# Patient Record
Sex: Female | Born: 1960
Health system: Southern US, Community
[De-identification: ages and names within clinical notes are randomized; demographics above are authoritative.]

## PROBLEM LIST (undated history)

## (undated) DIAGNOSIS — Z9221 Personal history of antineoplastic chemotherapy: Secondary | ICD-10-CM

## (undated) DIAGNOSIS — F419 Anxiety disorder, unspecified: Secondary | ICD-10-CM

## (undated) DIAGNOSIS — Z923 Personal history of irradiation: Secondary | ICD-10-CM

## (undated) DIAGNOSIS — T4145XA Adverse effect of unspecified anesthetic, initial encounter: Secondary | ICD-10-CM

## (undated) DIAGNOSIS — C50512 Malignant neoplasm of lower-outer quadrant of left female breast: Secondary | ICD-10-CM

## (undated) DIAGNOSIS — M2550 Pain in unspecified joint: Secondary | ICD-10-CM

## (undated) DIAGNOSIS — N854 Malposition of uterus: Secondary | ICD-10-CM

## (undated) DIAGNOSIS — K589 Irritable bowel syndrome without diarrhea: Secondary | ICD-10-CM

## (undated) DIAGNOSIS — K829 Disease of gallbladder, unspecified: Secondary | ICD-10-CM

## (undated) DIAGNOSIS — K219 Gastro-esophageal reflux disease without esophagitis: Secondary | ICD-10-CM

## (undated) DIAGNOSIS — F32A Depression, unspecified: Secondary | ICD-10-CM

## (undated) DIAGNOSIS — R12 Heartburn: Secondary | ICD-10-CM

## (undated) DIAGNOSIS — J45909 Unspecified asthma, uncomplicated: Secondary | ICD-10-CM

## (undated) DIAGNOSIS — T7840XA Allergy, unspecified, initial encounter: Secondary | ICD-10-CM

## (undated) DIAGNOSIS — T8859XA Other complications of anesthesia, initial encounter: Secondary | ICD-10-CM

## (undated) DIAGNOSIS — E063 Autoimmune thyroiditis: Secondary | ICD-10-CM

## (undated) DIAGNOSIS — F329 Major depressive disorder, single episode, unspecified: Secondary | ICD-10-CM

## (undated) DIAGNOSIS — E039 Hypothyroidism, unspecified: Secondary | ICD-10-CM

## (undated) HISTORY — PX: ENDOMETRIAL ABLATION: SHX621

## (undated) HISTORY — DX: Pain in unspecified joint: M25.50

## (undated) HISTORY — PX: CHOLECYSTECTOMY OPEN: SUR202

## (undated) HISTORY — DX: Malposition of uterus: N85.4

## (undated) HISTORY — DX: Anxiety disorder, unspecified: F41.9

## (undated) HISTORY — DX: Allergy, unspecified, initial encounter: T78.40XA

## (undated) HISTORY — DX: Malignant neoplasm of lower-outer quadrant of left female breast: C50.512

## (undated) HISTORY — DX: Heartburn: R12

## (undated) HISTORY — DX: Autoimmune thyroiditis: E06.3

## (undated) HISTORY — DX: Disease of gallbladder, unspecified: K82.9

## (undated) HISTORY — PX: BACK SURGERY: SHX140

## (undated) HISTORY — DX: Personal history of irradiation: Z92.3

## (undated) HISTORY — PX: OTHER SURGICAL HISTORY: SHX169

---

## 2013-05-04 LAB — HM MAMMOGRAPHY

## 2014-02-25 ENCOUNTER — Encounter: Payer: Self-pay | Admitting: Physician Assistant

## 2014-02-25 ENCOUNTER — Ambulatory Visit (INDEPENDENT_AMBULATORY_CARE_PROVIDER_SITE_OTHER): Payer: BLUE CROSS/BLUE SHIELD | Admitting: Physician Assistant

## 2014-02-25 VITALS — BP 177/66 | HR 64 | Ht 62.0 in | Wt 149.0 lb

## 2014-02-25 DIAGNOSIS — E7849 Other hyperlipidemia: Secondary | ICD-10-CM | POA: Insufficient documentation

## 2014-02-25 DIAGNOSIS — E063 Autoimmune thyroiditis: Secondary | ICD-10-CM

## 2014-02-25 DIAGNOSIS — Z23 Encounter for immunization: Secondary | ICD-10-CM

## 2014-02-25 DIAGNOSIS — J301 Allergic rhinitis due to pollen: Secondary | ICD-10-CM

## 2014-02-25 DIAGNOSIS — J309 Allergic rhinitis, unspecified: Secondary | ICD-10-CM | POA: Insufficient documentation

## 2014-02-25 DIAGNOSIS — E785 Hyperlipidemia, unspecified: Secondary | ICD-10-CM

## 2014-02-25 DIAGNOSIS — D259 Leiomyoma of uterus, unspecified: Secondary | ICD-10-CM | POA: Insufficient documentation

## 2014-02-25 DIAGNOSIS — K219 Gastro-esophageal reflux disease without esophagitis: Secondary | ICD-10-CM | POA: Diagnosis not present

## 2014-02-25 DIAGNOSIS — M2550 Pain in unspecified joint: Secondary | ICD-10-CM | POA: Insufficient documentation

## 2014-02-25 DIAGNOSIS — E782 Mixed hyperlipidemia: Secondary | ICD-10-CM | POA: Insufficient documentation

## 2014-02-25 MED ORDER — CELECOXIB 200 MG PO CAPS: 200.0000 mg | ORAL_CAPSULE | Freq: Two times a day (BID) | ORAL | Status: DC

## 2014-02-26 NOTE — Progress Notes (Signed)
Subjective:    Patient ID: Jenna [name], female    DOB: 01-29-1960, 54 y.o.   MRN: [id number]  HPI Patient is a 54 year old female who presents to the clinic to establish care.  .. Active Ambulatory Problems    Diagnosis Date Noted  . Hashimoto's thyroiditis 02/25/2014  . GERD (gastroesophageal reflux disease) 02/25/2014  . Allergic rhinitis 02/25/2014  . Uterine fibroid 02/25/2014  . Hyperlipidemia 02/25/2014  . Multiple joint pain 02/25/2014   Resolved Ambulatory Problems    Diagnosis Date Noted  . No Resolved Ambulatory Problems   Past Medical History  Diagnosis Date  . Hashimoto's disease    .Marland Kitchen Family History  Problem Relation Age of Onset  . Diabetes Mother   . Hypertension Mother   . Hyperlipidemia Mother   . Kidney disease Mother   . Cancer Maternal Grandfather    .Marland Kitchen History   Social History  . Marital Status: Married    Spouse Name: N/A  . Number of Children: N/A  . Years of Education: N/A   Occupational History  . Not on file.   Social History Main Topics  . Smoking status: Former Research scientist (life sciences)  . Smokeless tobacco: Not on file  . Alcohol Use: 0.6 oz/week    1 Standard drinks or equivalent per week  . Drug Use: No  . Sexual Activity:    Partners: Male   Other Topics Concern  . Not on file   Social History Narrative  . No narrative on file   Her only goal today is to establish care. She does not need medication refills.  Patient estimate her most concerning symptom is her joint pain all over. It is not in one joint. Sometimes will be in her knees sometimes in her shoulders and symptoms in her neck. She is currently on no treatment for this. She denies any injury or trauma.   Review of Systems  All other systems reviewed and are negative.      Objective:   Physical Exam  Constitutional: She is oriented to person, place, and time. She appears well-developed and well-nourished.  HENT:  Head: Normocephalic and atraumatic.  Cardiovascular:  Normal rate, regular rhythm and normal heart sounds.   Pulmonary/Chest: Effort normal and breath sounds normal.  Neurological: She is alert and oriented to person, place, and time.  Skin: Skin is dry.  Psychiatric: She has a normal mood and affect. Her behavior is normal.          Assessment & Plan:  Tetanus shot was given in office today with no complications.  Hashimoto's thyroiditis-patient has been referred to [facility] by her previous PCP. She has her first appointment at the beginning of March.  GERD-patient is on omeprazole she does not need a refill today. Her symptoms are well controlled.  Allergic rhinitis-patient is controlled with as needed antihistamine nasal spray.  Hyperlipidemia-per patient was checked in the summer of 2015. Will wait to get records to see what it was. Per patient slightly elevated but did not need medication. Discuss with patient she does need fasting labs including complete physical within the next year.  Multiple joint pain-being that Hashimoto's thyroiditis is an autoimmune disease. It could be that she does have some autoimmune process. Offered to do blood work today. Patient declined. She would like to follow-up with endocrinologist at [facility] for the [place] and didn't pursue it. She is not on any anti-inflammatory at this time. We did start Celebrex today to see if it added any benefit.  Discussed need for CPE to go over other health maintenance issues.

## 2014-03-14 ENCOUNTER — Encounter: Payer: Self-pay | Admitting: Physician Assistant

## 2014-03-14 ENCOUNTER — Other Ambulatory Visit: Payer: Self-pay | Admitting: Physician Assistant

## 2014-03-14 DIAGNOSIS — E063 Autoimmune thyroiditis: Secondary | ICD-10-CM

## 2014-03-14 DIAGNOSIS — M2550 Pain in unspecified joint: Secondary | ICD-10-CM

## 2014-04-22 ENCOUNTER — Other Ambulatory Visit: Payer: Self-pay | Admitting: Physician Assistant

## 2014-04-22 MED ORDER — CELECOXIB 200 MG PO CAPS: 200.0000 mg | ORAL_CAPSULE | Freq: Two times a day (BID) | ORAL | Status: DC

## 2014-04-22 NOTE — Telephone Encounter (Signed)
Needs appt for medication follow up.

## 2014-04-25 ENCOUNTER — Encounter: Payer: Self-pay | Admitting: Physician Assistant

## 2014-04-26 ENCOUNTER — Other Ambulatory Visit: Payer: Self-pay | Admitting: Physician Assistant

## 2014-04-29 ENCOUNTER — Other Ambulatory Visit: Payer: Self-pay | Admitting: Physician Assistant

## 2014-04-29 MED ORDER — LEVOTHYROXINE SODIUM 50 MCG PO TABS: 50.0000 ug | ORAL_TABLET | Freq: Every day | ORAL | Status: DC

## 2014-04-29 MED ORDER — OMEPRAZOLE 20 MG PO CPDR: 20.0000 mg | DELAYED_RELEASE_CAPSULE | Freq: Two times a day (BID) | ORAL | Status: DC

## 2014-05-02 LAB — T4, FREE: T4,FREE (DIRECT): 1.6

## 2014-05-02 LAB — T3, FREE: T3 FREE: 3.1

## 2014-05-02 LAB — TSH: TSH: 1.39 u[IU]/mL (ref 0.41–5.90)

## 2014-05-22 ENCOUNTER — Other Ambulatory Visit: Payer: Self-pay | Admitting: Physician Assistant

## 2014-05-24 ENCOUNTER — Other Ambulatory Visit: Payer: Self-pay | Admitting: *Deleted

## 2014-05-24 ENCOUNTER — Encounter: Payer: Self-pay | Admitting: Physician Assistant

## 2014-05-24 MED ORDER — CELECOXIB 200 MG PO CAPS: 200.0000 mg | ORAL_CAPSULE | Freq: Two times a day (BID) | ORAL | Status: DC

## 2014-05-24 NOTE — Telephone Encounter (Signed)
Error

## 2014-06-06 ENCOUNTER — Encounter: Payer: Self-pay | Admitting: Physician Assistant

## 2014-08-06 ENCOUNTER — Encounter: Payer: Self-pay | Admitting: Cardiology

## 2014-08-27 ENCOUNTER — Other Ambulatory Visit: Payer: Self-pay | Admitting: Physician Assistant

## 2014-10-22 ENCOUNTER — Ambulatory Visit: Payer: BLUE CROSS/BLUE SHIELD | Admitting: Physician Assistant

## 2014-10-28 ENCOUNTER — Ambulatory Visit (INDEPENDENT_AMBULATORY_CARE_PROVIDER_SITE_OTHER): Payer: BLUE CROSS/BLUE SHIELD | Admitting: Physician Assistant

## 2014-10-28 ENCOUNTER — Encounter: Payer: Self-pay | Admitting: Physician Assistant

## 2014-10-28 VITALS — BP 137/70 | HR 64 | Ht 62.0 in | Wt 149.0 lb

## 2014-10-28 DIAGNOSIS — E785 Hyperlipidemia, unspecified: Secondary | ICD-10-CM | POA: Diagnosis not present

## 2014-10-28 DIAGNOSIS — Z79899 Other long term (current) drug therapy: Secondary | ICD-10-CM

## 2014-10-28 DIAGNOSIS — E063 Autoimmune thyroiditis: Secondary | ICD-10-CM | POA: Diagnosis not present

## 2014-10-28 DIAGNOSIS — F418 Other specified anxiety disorders: Secondary | ICD-10-CM

## 2014-10-28 DIAGNOSIS — K219 Gastro-esophageal reflux disease without esophagitis: Secondary | ICD-10-CM

## 2014-10-28 DIAGNOSIS — Z23 Encounter for immunization: Secondary | ICD-10-CM | POA: Diagnosis not present

## 2014-10-28 DIAGNOSIS — Z1159 Encounter for screening for other viral diseases: Secondary | ICD-10-CM

## 2014-10-28 DIAGNOSIS — F329 Major depressive disorder, single episode, unspecified: Secondary | ICD-10-CM

## 2014-10-28 DIAGNOSIS — Z114 Encounter for screening for human immunodeficiency virus [HIV]: Secondary | ICD-10-CM

## 2014-10-28 DIAGNOSIS — F32A Depression, unspecified: Secondary | ICD-10-CM

## 2014-10-28 DIAGNOSIS — F419 Anxiety disorder, unspecified: Secondary | ICD-10-CM

## 2014-10-28 MED ORDER — ESCITALOPRAM OXALATE 10 MG PO TABS: 10.0000 mg | ORAL_TABLET | Freq: Every day | ORAL | Status: DC

## 2014-10-28 MED ORDER — CELECOXIB 200 MG PO CAPS: 200.0000 mg | ORAL_CAPSULE | Freq: Two times a day (BID) | ORAL | Status: DC

## 2014-10-28 MED ORDER — ALBUTEROL SULFATE HFA 108 (90 BASE) MCG/ACT IN AERS: 2.0000 | INHALATION_SPRAY | Freq: Four times a day (QID) | RESPIRATORY_TRACT | Status: DC | PRN

## 2014-10-28 NOTE — Progress Notes (Addendum)
Jenna [name] is a 54 y.o. female who presents to [place] today for medication refills. She states that she needs refills on her levothyroxine, celebrex, proair and proventil. She states that her allergist in [place] told her to use the proair daily and the proventil for rescue. She states that she has noticed an increase in her asthma symptoms since moving but she does not feel that they are an issue at this time.   She also states that she believes she may be experiencing some depression currently. She states that this has been going on for several years but has recently gotten worse. She said she has many episodes of crying for no reason, she has become more irritable especially towards her husband and children and she finds it hard to find pleasure in activities she used to enjoy. She states that she has previously tried counseling, meditation and exercise which have not helped very much. She states that when she saw counseling previously they believed her symptoms to be caused by her thyroid and she decided that she did not need counseling anymore.   PHQ-9 = 13 GAD-7 = 8   Past Medical History  Diagnosis Date  . Hashimoto's disease    Past Surgical History  Procedure Laterality Date  . Disk fusion    . Cholecystectomy open    . Endometrial ablation     Social History  Substance Use Topics  . Smoking status: Former Research scientist (life sciences)  . Smokeless tobacco: Not on file  . Alcohol Use: 0.6 oz/week    1 Standard drinks or equivalent per week   family history includes Cancer in her maternal grandfather; Diabetes in her mother; Hyperlipidemia in her mother; Hypertension in her mother; Kidney disease in her mother.  ROS as above Medications: Current Outpatient Prescriptions  Medication Sig Dispense Refill  . celecoxib (CELEBREX) 200 MG capsule Take 1 capsule (200 mg total) by mouth 2 (two) times daily. 60 capsule 5  . Cholecalciferol (VITAMIN D PO) Take by  mouth.    . Lactobacillus (DIGESTIVE HEALTH PROBIOTIC PO) Take by mouth.    . levothyroxine (SYNTHROID, LEVOTHROID) 50 MCG tablet Take 1 tablet (50 mcg total) by mouth daily. 90 tablet 1  . omeprazole (PRILOSEC) 20 MG capsule Take 1 capsule (20 mg total) by mouth 2 (two) times daily before a meal. 180 capsule 4  . Oxymetazoline HCl (NASAL SPRAY NA) Place into the nose.    . Polyethylene Glycol 3350 (MIRALAX PO) Take by mouth.    . Simethicone (GAS-X PO) Take by mouth.    Marland Kitchen albuterol (PROVENTIL HFA;VENTOLIN HFA) 108 (90 BASE) MCG/ACT inhaler Inhale 2 puffs into the lungs every 6 (six) hours as needed for wheezing or shortness of breath. 1 Inhaler 2  . escitalopram (LEXAPRO) 10 MG tablet Take 1 tablet (10 mg total) by mouth daily. 30 tablet 1   No current facility-administered medications for this visit.   No Known Allergies   Exam:  BP 137/70 mmHg  Pulse 64  Ht 5\' 2"  (1.575 m)  Wt 149 lb (67.586 kg)  BMI 27.25 kg/m2 Gen: WDWN female who is intermittently crying during the interview.  HEENT: EOMI,  MMM Lungs: Normal work of breathing. CTABL Heart: RRR no MRG Exts: Brisk capillary refill, warm and well perfused.   Assessment: 1. Hashimoto's thyroiditis based on previous diagnoses. 2. Asthma based on previous diagnoses and patient description of symptoms.  3. Moderate depression based on patient description of symptoms, PHQ-9 score of  13 and patient emotional status during interview. Mild anxiety based on GAD-7 score of 8. R/o other hormonal causes for mood swings, anxiety and depression. 4. Need for screening labs.  5. Multiple joint pain, well controlled with celebrex.   Plan: 1. Patient sent for labwork for TSH and if within normal limits will not need to f/u for 1 year. Patient levothyroxine refilled today.  2. Patient education provided on the concurrent use of both Proair and Proventil and the lack of benefit of using the same medications. I suggested she use just one inhaler  for rescue at this time, unless she begins to have increased symptoms. If she is using every day make another appt to discuss preventative. Patient conveyed understanding and agreed to the current treatment plan. 3. Patient sent for labwork today to r/o hormonal causes of symptoms. Patient started on escitalopram 10 mg tablet QD for depression and anxiety. Side discussed in office.  Patient instructed to f/u in 4-6 weeks for mood check. Patient instructed to return to the office or ED if she experiences suicidal or homicidal thoughts, chest pain or shortness of breath. Patient conveyed understanding and agreed to the current treatment plan.  4. Screening labs were ordered today for lipid and cmp. 5. celebrex refilled for symptom control.    Reviewed and changes made by Iran Planas PA-C

## 2014-10-29 ENCOUNTER — Encounter: Payer: Self-pay | Admitting: Physician Assistant

## 2014-10-29 DIAGNOSIS — Z780 Asymptomatic menopausal state: Secondary | ICD-10-CM | POA: Insufficient documentation

## 2014-10-29 LAB — LIPID PANEL
Cholesterol: 238 mg/dL — ABNORMAL HIGH (ref 125–200)
HDL: 71 mg/dL (ref >=46)
LDL CALC: 141 mg/dL — AB (ref <130)
Total CHOL/HDL Ratio: 3.4 Ratio (ref <=5.0)
Triglycerides: 131 mg/dL (ref <150)
VLDL: 26 mg/dL (ref <30)

## 2014-10-29 LAB — COMPLETE METABOLIC PANEL WITH GFR
ALT: 13 U/L (ref 6–29)
AST: 16 U/L (ref 10–35)
Albumin: 4.1 g/dL (ref 3.6–5.1)
Alkaline Phosphatase: 73 U/L (ref 33–130)
BILIRUBIN TOTAL: 0.6 mg/dL (ref 0.2–1.2)
BUN: 12 mg/dL (ref 7–25)
CALCIUM: 9.3 mg/dL (ref 8.6–10.4)
CO2: 27 mmol/L (ref 20–31)
CREATININE: 0.65 mg/dL (ref 0.50–1.05)
Chloride: 104 mmol/L (ref 98–110)
GFR, Est Non African American: >89 mL/min (ref >=60)
Glucose, Bld: 78 mg/dL (ref 65–99)
Potassium: 4.4 mmol/L (ref 3.5–5.3)
SODIUM: 142 mmol/L (ref 135–146)
TOTAL PROTEIN: 6.9 g/dL (ref 6.1–8.1)

## 2014-10-29 LAB — HIV ANTIBODY (ROUTINE TESTING W REFLEX): HIV 1&2 Ab, 4th Generation: NONREACTIVE

## 2014-10-29 LAB — FOLLICLE STIMULATING HORMONE: FSH: 140.5 m[IU]/mL — AB

## 2014-10-29 LAB — LUTEINIZING HORMONE: LH: 58 m[IU]/mL

## 2014-10-29 LAB — TSH: TSH: 1.885 u[IU]/mL (ref 0.350–4.500)

## 2014-10-29 LAB — HEPATITIS C ANTIBODY: HCV Ab: NEGATIVE

## 2014-11-18 ENCOUNTER — Telehealth: Payer: Self-pay | Admitting: *Deleted

## 2014-11-18 NOTE — Telephone Encounter (Signed)
Pt's husband left vm this morning stating that since you started pt on Lexapro her food intake is about 1/3 of what it was before.  I called the pt herself this afternoon to discuss this.  Apparently she did not know that her husband had called me & stated that she thinks she was just emotionally overeating before & now it's just evening back out.  I advised her that she needed a f/u with you and she agreed to make one.  This is just an [place].

## 2014-11-18 NOTE — Telephone Encounter (Signed)
Follow up and recheck weight.

## 2014-12-11 ENCOUNTER — Encounter: Payer: Self-pay | Admitting: Physician Assistant

## 2014-12-11 ENCOUNTER — Ambulatory Visit (INDEPENDENT_AMBULATORY_CARE_PROVIDER_SITE_OTHER): Payer: BLUE CROSS/BLUE SHIELD | Admitting: Physician Assistant

## 2014-12-11 VITALS — BP 114/66 | HR 72 | Ht 62.0 in | Wt 149.0 lb

## 2014-12-11 DIAGNOSIS — F418 Other specified anxiety disorders: Secondary | ICD-10-CM | POA: Diagnosis not present

## 2014-12-11 DIAGNOSIS — R454 Irritability and anger: Secondary | ICD-10-CM

## 2014-12-11 DIAGNOSIS — G4700 Insomnia, unspecified: Secondary | ICD-10-CM | POA: Diagnosis not present

## 2014-12-11 DIAGNOSIS — F419 Anxiety disorder, unspecified: Principal | ICD-10-CM | POA: Insufficient documentation

## 2014-12-11 DIAGNOSIS — F329 Major depressive disorder, single episode, unspecified: Secondary | ICD-10-CM

## 2014-12-11 MED ORDER — TRAZODONE HCL 50 MG PO TABS: 50.0000 mg | ORAL_TABLET | Freq: Every day | ORAL | Status: DC

## 2014-12-11 MED ORDER — ESCITALOPRAM OXALATE 20 MG PO TABS: 20.0000 mg | ORAL_TABLET | Freq: Every day | ORAL | Status: DC

## 2014-12-11 MED ORDER — LEVOTHYROXINE SODIUM 50 MCG PO TABS: 50.0000 ug | ORAL_TABLET | Freq: Every day | ORAL | Status: DC

## 2014-12-11 NOTE — Progress Notes (Signed)
   Subjective:    Patient ID: Jenna [name], female    DOB: 01-27-1960, 54 y.o.   MRN: [id number]  HPI Pt is a 54 yo female who presents to the clinic to follow up on anxiety, depression, anger and irritability. She is on lexapro 10mg  and doing much better. She is crying much less and not having outburst of anger like she was. She is still not sleeping. She feels like if she could sleep would make her feel even better. She does have less of appetite but still eating. She feels like she has cut out stress eating. Denies any suicidal or homicidal thoughts.    Review of Systems  All other systems reviewed and are negative.      Objective:   Physical Exam  Constitutional: She is oriented to person, place, and time. She appears well-developed and well-nourished.  HENT:  Head: Normocephalic and atraumatic.  Cardiovascular: Normal rate, regular rhythm and normal heart sounds.   Pulmonary/Chest: Effort normal and breath sounds normal.  Neurological: She is alert and oriented to person, place, and time.  Psychiatric: She has a normal mood and affect. Her behavior is normal.          Assessment & Plan:  Anxiety and Depression/irritability and anger/insomnia- PHQ-9 was 13 but 6 points related to sleep. GAD-7 was 4 which improved. Overall scores have improved. Increase lexapro to 20mg  daily. Added trazodone 25-50mg  at bedtime. Follow up in 3-6 months. Weight is the same I am not concerned with decrease in appetite in eating as long as she is getting 1500 calories a day.   Hypothyroidism- last TSH in octobet looked great. Refilled for 6 months.

## 2014-12-20 ENCOUNTER — Encounter: Payer: Self-pay | Admitting: Physician Assistant

## 2014-12-20 ENCOUNTER — Other Ambulatory Visit: Payer: Self-pay

## 2014-12-20 MED ORDER — ALBUTEROL SULFATE 108 (90 BASE) MCG/ACT IN AEPB: 2.0000 | INHALATION_SPRAY | Freq: Four times a day (QID) | RESPIRATORY_TRACT | Status: DC

## 2014-12-20 MED ORDER — ESCITALOPRAM OXALATE 20 MG PO TABS: 20.0000 mg | ORAL_TABLET | Freq: Every day | ORAL | Status: DC

## 2014-12-21 ENCOUNTER — Other Ambulatory Visit: Payer: Self-pay | Admitting: Physician Assistant

## 2014-12-26 ENCOUNTER — Telehealth: Payer: Self-pay | Admitting: Physician Assistant

## 2014-12-26 NOTE — Telephone Encounter (Signed)
Received prior authorization on Proair Respiclick Inhaler sent through cover my meds waiting on authorization. - CF

## 2015-01-05 DIAGNOSIS — C50919 Malignant neoplasm of unspecified site of unspecified female breast: Secondary | ICD-10-CM

## 2015-01-05 HISTORY — DX: Malignant neoplasm of unspecified site of unspecified female breast: C50.919

## 2015-01-29 ENCOUNTER — Encounter: Payer: Self-pay | Admitting: Physician Assistant

## 2015-02-05 ENCOUNTER — Other Ambulatory Visit: Payer: Self-pay | Admitting: *Deleted

## 2015-02-05 MED ORDER — TRAZODONE HCL 50 MG PO TABS: 50.0000 mg | ORAL_TABLET | Freq: Every day | ORAL | Status: DC

## 2015-02-12 NOTE — Telephone Encounter (Signed)
Prior authorization is not required for this medication. - CF

## 2015-02-22 ENCOUNTER — Other Ambulatory Visit: Payer: Self-pay | Admitting: Physician Assistant

## 2015-02-27 ENCOUNTER — Other Ambulatory Visit: Payer: Self-pay | Admitting: *Deleted

## 2015-02-27 MED ORDER — CELECOXIB 200 MG PO CAPS: 200.0000 mg | ORAL_CAPSULE | Freq: Two times a day (BID) | ORAL | Status: DC

## 2015-03-25 ENCOUNTER — Other Ambulatory Visit: Payer: Self-pay | Admitting: Physician Assistant

## 2015-04-05 ENCOUNTER — Other Ambulatory Visit: Payer: Self-pay | Admitting: Physician Assistant

## 2015-06-28 ENCOUNTER — Other Ambulatory Visit: Payer: Self-pay | Admitting: Physician Assistant

## 2015-06-30 ENCOUNTER — Other Ambulatory Visit: Payer: Self-pay | Admitting: Emergency Medicine

## 2015-06-30 DIAGNOSIS — E063 Autoimmune thyroiditis: Secondary | ICD-10-CM

## 2015-06-30 MED ORDER — LEVOTHYROXINE SODIUM 50 MCG PO TABS
ORAL_TABLET | ORAL | Status: DC
Start: 2015-06-30 — End: 2015-12-22

## 2015-07-03 ENCOUNTER — Other Ambulatory Visit: Payer: Self-pay | Admitting: Physician Assistant

## 2015-07-29 ENCOUNTER — Encounter: Payer: Self-pay | Admitting: Physician Assistant

## 2015-07-29 DIAGNOSIS — E039 Hypothyroidism, unspecified: Secondary | ICD-10-CM

## 2015-07-30 ENCOUNTER — Other Ambulatory Visit: Payer: Self-pay | Admitting: Physician Assistant

## 2015-07-30 DIAGNOSIS — E039 Hypothyroidism, unspecified: Secondary | ICD-10-CM

## 2015-08-01 ENCOUNTER — Other Ambulatory Visit: Payer: Self-pay | Admitting: Physician Assistant

## 2015-08-01 MED ORDER — LIOTHYRONINE SODIUM 25 MCG PO TABS: 25.0000 ug | ORAL_TABLET | Freq: Every day | ORAL | 0 refills | Status: DC

## 2015-08-12 ENCOUNTER — Encounter: Payer: Self-pay | Admitting: Physician Assistant

## 2015-08-12 ENCOUNTER — Ambulatory Visit (INDEPENDENT_AMBULATORY_CARE_PROVIDER_SITE_OTHER): Payer: BLUE CROSS/BLUE SHIELD | Admitting: Physician Assistant

## 2015-08-12 VITALS — BP 126/73 | HR 71 | Ht 62.0 in | Wt 161.0 lb

## 2015-08-12 DIAGNOSIS — Z1231 Encounter for screening mammogram for malignant neoplasm of breast: Secondary | ICD-10-CM

## 2015-08-12 DIAGNOSIS — Z13220 Encounter for screening for lipoid disorders: Secondary | ICD-10-CM | POA: Diagnosis not present

## 2015-08-12 DIAGNOSIS — R140 Abdominal distension (gaseous): Secondary | ICD-10-CM | POA: Diagnosis not present

## 2015-08-12 DIAGNOSIS — F418 Other specified anxiety disorders: Secondary | ICD-10-CM

## 2015-08-12 DIAGNOSIS — Z840 Family history of diseases of the skin and subcutaneous tissue: Secondary | ICD-10-CM

## 2015-08-12 DIAGNOSIS — G4700 Insomnia, unspecified: Secondary | ICD-10-CM

## 2015-08-12 DIAGNOSIS — Z0000 Encounter for general adult medical examination without abnormal findings: Secondary | ICD-10-CM

## 2015-08-12 DIAGNOSIS — E063 Autoimmune thyroiditis: Secondary | ICD-10-CM

## 2015-08-12 DIAGNOSIS — E039 Hypothyroidism, unspecified: Secondary | ICD-10-CM | POA: Diagnosis not present

## 2015-08-12 DIAGNOSIS — Z131 Encounter for screening for diabetes mellitus: Secondary | ICD-10-CM

## 2015-08-12 DIAGNOSIS — Z8261 Family history of arthritis: Secondary | ICD-10-CM

## 2015-08-12 DIAGNOSIS — M2550 Pain in unspecified joint: Secondary | ICD-10-CM

## 2015-08-12 DIAGNOSIS — Z8269 Family history of other diseases of the musculoskeletal system and connective tissue: Secondary | ICD-10-CM

## 2015-08-12 DIAGNOSIS — Z139 Encounter for screening, unspecified: Secondary | ICD-10-CM

## 2015-08-12 DIAGNOSIS — F419 Anxiety disorder, unspecified: Secondary | ICD-10-CM

## 2015-08-12 DIAGNOSIS — F329 Major depressive disorder, single episode, unspecified: Secondary | ICD-10-CM

## 2015-08-12 DIAGNOSIS — F32A Depression, unspecified: Secondary | ICD-10-CM

## 2015-08-12 DIAGNOSIS — K5900 Constipation, unspecified: Secondary | ICD-10-CM

## 2015-08-12 LAB — COMPLETE METABOLIC PANEL WITH GFR
ALT: 19 U/L (ref 6–29)
AST: 20 U/L (ref 10–35)
Albumin: 4.1 g/dL (ref 3.6–5.1)
Alkaline Phosphatase: 60 U/L (ref 33–130)
BILIRUBIN TOTAL: 0.6 mg/dL (ref 0.2–1.2)
BUN: 13 mg/dL (ref 7–25)
CALCIUM: 9.7 mg/dL (ref 8.6–10.4)
CHLORIDE: 102 mmol/L (ref 98–110)
CO2: 24 mmol/L (ref 20–31)
CREATININE: 0.68 mg/dL (ref 0.50–1.05)
GFR, Est Non African American: >89 mL/min (ref >=60)
Glucose, Bld: 84 mg/dL (ref 65–99)
Potassium: 4.5 mmol/L (ref 3.5–5.3)
Sodium: 142 mmol/L (ref 135–146)
Total Protein: 6.9 g/dL (ref 6.1–8.1)

## 2015-08-12 LAB — LIPID PANEL
CHOLESTEROL: 219 mg/dL — AB (ref 125–200)
HDL: 73 mg/dL (ref >=46)
LDL Cholesterol: 111 mg/dL (ref <130)
TRIGLYCERIDES: 173 mg/dL — AB (ref <150)
Total CHOL/HDL Ratio: 3 Ratio (ref <=5.0)
VLDL: 35 mg/dL — ABNORMAL HIGH (ref <30)

## 2015-08-12 LAB — RHEUMATOID FACTOR

## 2015-08-12 MED ORDER — DULOXETINE HCL 30 MG PO CPEP: 30.0000 mg | ORAL_CAPSULE | Freq: Every day | ORAL | 1 refills | Status: DC

## 2015-08-12 MED ORDER — LINACLOTIDE 290 MCG PO CAPS: 290.0000 ug | ORAL_CAPSULE | Freq: Every day | ORAL | 1 refills | Status: DC

## 2015-08-12 NOTE — Patient Instructions (Signed)
Keeping You Healthy  Get These Tests  Blood Pressure- Have your blood pressure checked by your healthcare provider at least once a year.  Normal blood pressure is 120/80.  Weight- Have your body mass index (BMI) calculated to screen for obesity.  BMI is a measure of body fat based on height and weight.  You can calculate your own BMI at www.nhlbisupport.com/bmi/  Cholesterol- Have your cholesterol checked every year.  Diabetes- Have your blood sugar checked every year if you have high blood pressure, high cholesterol, a family history of diabetes or if you are overweight.  Pap Test - Have a pap test every 1 to 5 years if you have been sexually active.  If you are older than 53 and recent pap tests have been normal you may not need additional pap tests.  In addition, if you have had a hysterectomy  for benign disease additional pap tests are not necessary.  Mammogram-Yearly mammograms are essential for early detection of breast cancer  Screening for Colon Cancer- Colonoscopy starting at age 53. Screening may begin sooner depending on your family history and other health conditions.  Follow up colonoscopy as directed by your Gastroenterologist.  Screening for Osteoporosis- Screening begins at age 65 with bone density scanning, sooner if you are at higher risk for developing Osteoporosis.  Get these medicines  Calcium with Vitamin D- Your body requires 1200-1500 mg of Calcium a day and 800-1000 IU of Vitamin D a day.  You can only absorb 500 mg of Calcium at a time therefore Calcium must be taken in 2 or 3 separate doses throughout the day.  Hormones- Hormone therapy has been associated with increased risk for certain cancers and heart disease.  Talk to your healthcare provider about if you need relief from menopausal symptoms.  Aspirin- Ask your healthcare provider about taking Aspirin to prevent Heart Disease and Stroke.  Get these Immuniztions  Flu shot- Every fall  Pneumonia shot-  Once after the age of 53; if you are younger ask your healthcare provider if you need a pneumonia shot.  Tetanus- Every ten years.  Zostavax- Once after the age of 53 to prevent shingles.  Take these steps  Don't smoke- Your healthcare provider can help you quit. For tips on how to quit, ask your healthcare provider or go to www.smokefree.gov or call 1-800 QUIT-NOW.  Be physically active- Exercise 5 days a week for a minimum of 30 minutes.  If you are not already physically active, start slow and gradually work up to 30 minutes of moderate physical activity.  Try walking, dancing, bike riding, swimming, etc.  Eat a healthy diet- Eat a variety of healthy foods such as fruits, vegetables, whole grains, low fat milk, low fat cheeses, yogurt, lean meats, chicken, fish, eggs, dried beans, tofu, etc.  For more information go to www.thenutritionsource.org  Dental visit- Brush and floss teeth twice daily; visit your dentist twice a year.  Eye exam- Visit your Optometrist or Ophthalmologist yearly.  Drink alcohol in moderation- Limit alcohol intake to one drink or less a day.  Never drink and drive.  Depression- Your emotional health is as important as your physical health.  If you're feeling down or losing interest in things you normally enjoy, please talk to your healthcare provider.  Seat Belts- can save your life; always wear one  Smoke/Carbon Monoxide detectors- These detectors need to be installed on the appropriate level of your home.  Replace batteries at least once a year.  Violence- If   anyone is threatening or hurting you, please tell your healthcare provider.  Living Will/ Health care power of attorney- Discuss with your healthcare provider and family. 

## 2015-08-13 ENCOUNTER — Encounter: Payer: Self-pay | Admitting: Physician Assistant

## 2015-08-13 DIAGNOSIS — E781 Pure hyperglyceridemia: Secondary | ICD-10-CM | POA: Insufficient documentation

## 2015-08-13 LAB — T3 UPTAKE: T3 Uptake: 31 % (ref 22–35)

## 2015-08-13 LAB — THYROID ANTIBODIES
THYROGLOBULIN AB: 14 [IU]/mL — AB (ref <2)
THYROID PEROXIDASE ANTIBODY: 162 [IU]/mL — AB (ref <9)

## 2015-08-13 LAB — TSH: TSH: 0.1 m[IU]/L — AB

## 2015-08-13 LAB — CYCLIC CITRUL PEPTIDE ANTIBODY, IGG: Cyclic Citrullin Peptide Ab: <16 Units

## 2015-08-13 LAB — T4, FREE: FREE T4: 1.2 ng/dL (ref 0.8–1.8)

## 2015-08-14 LAB — T3, REVERSE: T3, Reverse: 13 ng/dL (ref 8–25)

## 2015-08-15 ENCOUNTER — Encounter: Payer: Self-pay | Admitting: Physician Assistant

## 2015-08-15 DIAGNOSIS — K5900 Constipation, unspecified: Secondary | ICD-10-CM | POA: Insufficient documentation

## 2015-08-15 DIAGNOSIS — Z8269 Family history of other diseases of the musculoskeletal system and connective tissue: Secondary | ICD-10-CM | POA: Insufficient documentation

## 2015-08-15 DIAGNOSIS — R140 Abdominal distension (gaseous): Secondary | ICD-10-CM | POA: Insufficient documentation

## 2015-08-15 LAB — IGG FOOD PANEL
ALLERGEN BEEF IGG: 9.5 ug/mL — AB (ref <2.0)
ALLERGEN WHEAT IGG: 1 ug/mL — AB (ref <0.15)
Allergen, Corn, IgG4: <0.15 ug/mL (ref <0.15)
Allergen, Milk, IgG: 17.4 ug/mL — ABNORMAL HIGH (ref <0.15)
Peanut, IgG: <0.15 ug/mL (ref <0.15)

## 2015-08-15 NOTE — Progress Notes (Signed)
Subjective:     Jenna [name] is a 55 y.o. female and is here for a comprehensive physical exam. The patient reports problems - pt feels like her symptoms that she was having before being diagnosed with thyroiid disease are coming back. she is tired, dry skin, blloating, consttipation, insomnia, weight gain. we recently added cytomel but not noticed any improvement. .  Social History   Social History  . Marital status: Married    Spouse name: N/A  . Number of children: N/A  . Years of education: N/A   Occupational History  . Not on file.   Social History Main Topics  . Smoking status: Former Research scientist (life sciences)  . Smokeless tobacco: Not on file  . Alcohol use 0.6 oz/week    1 Standard drinks or equivalent per week  . Drug use: No  . Sexual activity: Yes    Partners: Male   Other Topics Concern  . Not on file   Social History Narrative  . No narrative on file   Health Maintenance  Topic Date Due  . MAMMOGRAM  05/05/2015  . INFLUENZA VACCINE  08/05/2015  . PAP SMEAR  05/18/2016  . COLONOSCOPY  06/26/2018  . TETANUS/TDAP  02/26/2024  . Hepatitis C Screening  Completed  . HIV Screening  Completed    The following portions of the patient's history were reviewed and updated as appropriate: allergies, current medications, past family history, past medical history, past social history, past surgical history and problem list.  Review of Systems Pertinent items noted in HPI and remainder of comprehensive ROS otherwise negative.   Objective:    BP 126/73   Pulse 71   Ht 5\' 2"  (1.575 m)   Wt 161 lb (73 kg)   BMI 29.45 kg/m  General appearance: alert, cooperative and appears stated age Head: Normocephalic, without obvious abnormality, atraumatic Eyes: conjunctivae/corneas clear. PERRL, EOM's intact. Fundi benign. Ears: normal TM's and external ear canals both ears Nose: Nares normal. Septum midline. Mucosa normal. No drainage or sinus tenderness. Throat: lips, mucosa, and tongue  normal; teeth and gums normal Neck: no adenopathy, no carotid bruit, no JVD, supple, symmetrical, trachea midline and thyroid not enlarged, symmetric, no tenderness/mass/nodules Back: symmetric, no curvature. ROM normal. No CVA tenderness. Lungs: clear to auscultation bilaterally Heart: regular rate and rhythm, S1, S2 normal, no murmur, click, rub or gallop Abdomen: soft, non-tender; bowel sounds normal; no masses,  no organomegaly Extremities: extremities normal, atraumatic, no cyanosis or edema Pulses: 2+ and symmetric Skin: Skin color, texture, turgor normal. No rashes or lesions Lymph nodes: Cervical, supraclavicular, and axillary nodes normal. Neurologic: Alert and oriented X 3, normal strength and tone. Normal symmetric reflexes. Normal coordination and gait    Assessment:    Healthy female exam.      Plan:  CPE- vaccines up to date. Colonoscopy up to date. Needs mammogram. Lipid, cmp. Discussed vitamin d 800 units and calcium 1500mg   Encouraged exercise.   Anxiety and depression/insomnia- PHQ-9 was 22. GAD-7 was 18.   Hashimoto's thyroiditis- recheck levels and adjust accordingly.   Multiple joint pain- likely ANa would be positive with hashimotos'. fam hx of RA/lupus. Rheumatoid ordered. Consider referral to rheumatology.      See After Visit Summary for Counseling Recommendations

## 2015-08-19 ENCOUNTER — Encounter: Payer: Self-pay | Admitting: Physician Assistant

## 2015-08-19 DIAGNOSIS — Z91018 Allergy to other foods: Secondary | ICD-10-CM | POA: Insufficient documentation

## 2015-08-22 ENCOUNTER — Ambulatory Visit (INDEPENDENT_AMBULATORY_CARE_PROVIDER_SITE_OTHER): Payer: BLUE CROSS/BLUE SHIELD

## 2015-08-22 DIAGNOSIS — Z0000 Encounter for general adult medical examination without abnormal findings: Secondary | ICD-10-CM

## 2015-08-22 DIAGNOSIS — Z1231 Encounter for screening mammogram for malignant neoplasm of breast: Secondary | ICD-10-CM

## 2015-08-22 DIAGNOSIS — R928 Other abnormal and inconclusive findings on diagnostic imaging of breast: Secondary | ICD-10-CM

## 2015-08-28 ENCOUNTER — Other Ambulatory Visit: Payer: Self-pay | Admitting: Physician Assistant

## 2015-09-02 ENCOUNTER — Encounter: Payer: Self-pay | Admitting: Physician Assistant

## 2015-09-02 DIAGNOSIS — N6320 Unspecified lump in the left breast, unspecified quadrant: Secondary | ICD-10-CM | POA: Insufficient documentation

## 2015-09-03 ENCOUNTER — Other Ambulatory Visit: Payer: Self-pay | Admitting: Physician Assistant

## 2015-09-03 DIAGNOSIS — R928 Other abnormal and inconclusive findings on diagnostic imaging of breast: Secondary | ICD-10-CM

## 2015-09-09 ENCOUNTER — Other Ambulatory Visit: Payer: Self-pay | Admitting: Physician Assistant

## 2015-09-09 ENCOUNTER — Ambulatory Visit
Admission: RE | Admit: 2015-09-09 | Discharge: 2015-09-09 | Disposition: A | Discharge status: Home / Self Care | Payer: BLUE CROSS/BLUE SHIELD | Source: Ambulatory Visit | Attending: Physician Assistant | Admitting: Physician Assistant

## 2015-09-09 DIAGNOSIS — N63 Unspecified lump in breast: Secondary | ICD-10-CM | POA: Diagnosis not present

## 2015-09-09 DIAGNOSIS — R928 Other abnormal and inconclusive findings on diagnostic imaging of breast: Secondary | ICD-10-CM

## 2015-09-09 DIAGNOSIS — R229 Localized swelling, mass and lump, unspecified: Principal | ICD-10-CM

## 2015-09-09 DIAGNOSIS — IMO0002 Reserved for concepts with insufficient information to code with codable children: Secondary | ICD-10-CM

## 2015-09-10 ENCOUNTER — Ambulatory Visit
Admission: RE | Admit: 2015-09-10 | Discharge: 2015-09-10 | Disposition: A | Discharge status: Home / Self Care | Payer: BLUE CROSS/BLUE SHIELD | Source: Ambulatory Visit | Attending: Physician Assistant | Admitting: Physician Assistant

## 2015-09-10 ENCOUNTER — Other Ambulatory Visit: Payer: Self-pay | Admitting: Physician Assistant

## 2015-09-10 ENCOUNTER — Encounter: Payer: Self-pay | Admitting: Physician Assistant

## 2015-09-10 DIAGNOSIS — R229 Localized swelling, mass and lump, unspecified: Principal | ICD-10-CM

## 2015-09-10 DIAGNOSIS — N63 Unspecified lump in breast: Secondary | ICD-10-CM | POA: Diagnosis not present

## 2015-09-10 DIAGNOSIS — IMO0002 Reserved for concepts with insufficient information to code with codable children: Secondary | ICD-10-CM

## 2015-09-10 DIAGNOSIS — C50512 Malignant neoplasm of lower-outer quadrant of left female breast: Secondary | ICD-10-CM | POA: Diagnosis not present

## 2015-09-10 HISTORY — PX: BREAST BIOPSY: SHX20

## 2015-09-12 ENCOUNTER — Telehealth: Payer: Self-pay | Admitting: *Deleted

## 2015-09-12 ENCOUNTER — Encounter: Payer: Self-pay | Admitting: *Deleted

## 2015-09-12 DIAGNOSIS — C50512 Malignant neoplasm of lower-outer quadrant of left female breast: Secondary | ICD-10-CM

## 2015-09-12 HISTORY — DX: Malignant neoplasm of lower-outer quadrant of left female breast: C50.512

## 2015-09-12 NOTE — Telephone Encounter (Signed)
Confirmed [facility] for 09/17/15 at 0815 .  Instructions and contact information given.  

## 2015-09-16 ENCOUNTER — Encounter: Payer: Self-pay | Admitting: Genetic Counselor

## 2015-09-16 ENCOUNTER — Other Ambulatory Visit (HOSPITAL_COMMUNITY): Payer: Self-pay | Admitting: Diagnostic Radiology

## 2015-09-17 ENCOUNTER — Ambulatory Visit: Payer: Self-pay | Admitting: Surgery

## 2015-09-17 ENCOUNTER — Ambulatory Visit (HOSPITAL_BASED_OUTPATIENT_CLINIC_OR_DEPARTMENT_OTHER): Payer: BLUE CROSS/BLUE SHIELD | Admitting: Hematology and Oncology

## 2015-09-17 ENCOUNTER — Encounter: Payer: Self-pay | Admitting: Physical Therapy

## 2015-09-17 ENCOUNTER — Other Ambulatory Visit: Payer: Self-pay | Admitting: *Deleted

## 2015-09-17 ENCOUNTER — Ambulatory Visit
Admission: RE | Admit: 2015-09-17 | Discharge: 2015-09-17 | Disposition: A | Discharge status: Home / Self Care | Payer: BLUE CROSS/BLUE SHIELD | Source: Ambulatory Visit | Attending: Radiation Oncology | Admitting: Radiation Oncology

## 2015-09-17 ENCOUNTER — Other Ambulatory Visit: Payer: Self-pay | Admitting: Physician Assistant

## 2015-09-17 ENCOUNTER — Ambulatory Visit: Payer: BLUE CROSS/BLUE SHIELD | Attending: Surgery | Admitting: Physical Therapy

## 2015-09-17 ENCOUNTER — Other Ambulatory Visit (HOSPITAL_BASED_OUTPATIENT_CLINIC_OR_DEPARTMENT_OTHER): Payer: BLUE CROSS/BLUE SHIELD

## 2015-09-17 ENCOUNTER — Encounter: Payer: Self-pay | Admitting: Hematology and Oncology

## 2015-09-17 ENCOUNTER — Encounter: Payer: Self-pay | Admitting: *Deleted

## 2015-09-17 DIAGNOSIS — C50512 Malignant neoplasm of lower-outer quadrant of left female breast: Secondary | ICD-10-CM

## 2015-09-17 DIAGNOSIS — E063 Autoimmune thyroiditis: Secondary | ICD-10-CM

## 2015-09-17 DIAGNOSIS — C50112 Malignant neoplasm of central portion of left female breast: Secondary | ICD-10-CM

## 2015-09-17 DIAGNOSIS — R293 Abnormal posture: Secondary | ICD-10-CM

## 2015-09-17 DIAGNOSIS — C50912 Malignant neoplasm of unspecified site of left female breast: Secondary | ICD-10-CM | POA: Diagnosis not present

## 2015-09-17 DIAGNOSIS — Z170 Estrogen receptor positive status [ER+]: Secondary | ICD-10-CM | POA: Diagnosis not present

## 2015-09-17 LAB — COMPREHENSIVE METABOLIC PANEL
ALK PHOS: 58 U/L (ref 40–150)
ALT: 17 U/L (ref 0–55)
ANION GAP: 10 meq/L (ref 3–11)
AST: 17 U/L (ref 5–34)
Albumin: 3.1 g/dL — ABNORMAL LOW (ref 3.5–5.0)
BUN: 16 mg/dL (ref 7.0–26.0)
CALCIUM: 9.3 mg/dL (ref 8.4–10.4)
CHLORIDE: 104 meq/L (ref 98–109)
CO2: 28 mEq/L (ref 22–29)
Creatinine: 0.7 mg/dL (ref 0.6–1.1)
Glucose: 90 mg/dl (ref 70–140)
POTASSIUM: 4.2 meq/L (ref 3.5–5.1)
Sodium: 142 mEq/L (ref 136–145)
Total Bilirubin: 0.54 mg/dL (ref 0.20–1.20)
Total Protein: 6.7 g/dL (ref 6.4–8.3)

## 2015-09-17 LAB — CBC WITH DIFFERENTIAL/PLATELET
BASO%: 0.2 % (ref 0.0–2.0)
BASOS ABS: 0 10*3/uL (ref 0.0–0.1)
EOS ABS: 0.1 10*3/uL (ref 0.0–0.5)
EOS%: 2.2 % (ref 0.0–7.0)
HEMATOCRIT: 38.9 % (ref 34.8–46.6)
HGB: 12.7 g/dL (ref 11.6–15.9)
LYMPH#: 2.1 10*3/uL (ref 0.9–3.3)
LYMPH%: 34.2 % (ref 14.0–49.7)
MCH: 30.1 pg (ref 25.1–34.0)
MCHC: 32.6 g/dL (ref 31.5–36.0)
MCV: 92.2 fL (ref 79.5–101.0)
MONO#: 0.6 10*3/uL (ref 0.1–0.9)
MONO%: 9.7 % (ref 0.0–14.0)
NEUT#: 3.2 10*3/uL (ref 1.5–6.5)
NEUT%: 53.7 % (ref 38.4–76.8)
PLATELETS: 230 10*3/uL (ref 145–400)
RBC: 4.22 10*6/uL (ref 3.70–5.45)
RDW: 12.5 % (ref 11.2–14.5)
WBC: 6 10*3/uL (ref 3.9–10.3)

## 2015-09-17 NOTE — Patient Instructions (Signed)

## 2015-09-17 NOTE — H&P (Signed)
Rennie Natter 09/17/2015 7:29 AM Location: [facility] Patient #: [id number] DOB: 04-22-1960 Undefined / Language: Cleophus Molt / Race: White Female  History of Present Illness Marcello Moores A. Lisett Dirusso MD; 09/17/2015 10:11 AM) Patient words: Breast cancer left  Pt sent at the request of Dr Sondra Come for left breast cancer diagnosed by mammogram and U/S. Pt denies hx of breast mass, pain or discharge. No hx of breast cancer in her family. Mammogram showed 2 nodules 2 cm apart left UOQ IDC ER PR + HER 2 NEU +. Ech is about 8 mm.  The patient is a 55 year old female.   Other Problems Tawni Pummel, RN; 09/17/2015 7:29 AM) Anxiety Disorder Arthritis Asthma Back Pain Breast Cancer Cholelithiasis Depression Gastroesophageal Reflux Disease General anesthesia - complications Lump In Breast Thyroid Disease  Past Surgical History Tawni Pummel, RN; 09/17/2015 7:29 AM) Breast Biopsy Left. Colon Polyp Removal - Colonoscopy Gallbladder Surgery - Laparoscopic Sentinel Lymph Node Biopsy Spinal Surgery - Neck  Diagnostic Studies History Tawni Pummel, RN; 09/17/2015 7:29 AM) Colonoscopy 1-5 years ago Mammogram within last year Pap Smear 1-5 years ago  Medication History Tawni Pummel, RN; 09/17/2015 7:29 AM) Medications Reconciled  Social History Tawni Pummel, RN; 09/17/2015 7:29 AM) Alcohol use Occasional alcohol use. Caffeine use Coffee, Tea. No drug use Tobacco use Former smoker.  Family History Tawni Pummel, RN; 09/17/2015 7:29 AM) Alcohol Abuse Family Members In General. Arthritis Brother, Sister. Cancer Family Members In General. Hypertension Family Members In General, Mother. Kidney Disease Mother. Migraine Headache Mother. Thyroid problems Family Members In General.  Pregnancy / Birth History Tawni Pummel, RN; 09/17/2015 7:29 AM) Age at menarche 58 years. Age of menopause 40-50 Contraceptive History Oral  contraceptives. Gravida 2 Irregular periods Length (months) of breastfeeding 3-6 Maternal age 3-25 Para 2     Review of Systems Tawni Pummel RN; 09/17/2015 7:29 AM) General Present- Appetite Loss, Night Sweats and Weight Gain. Not Present- Chills, Fatigue, Fever and Weight Loss. Skin Present- Dryness. Not Present- Change in Wart/Mole, Hives, Jaundice, New Lesions, Non-Healing Wounds, Rash and Ulcer. HEENT Present- Earache, Sinus Pain, Sore Throat and Wears glasses/contact lenses. Not Present- Hearing Loss, Hoarseness, Nose Bleed, Oral Ulcers, Ringing in the Ears, Seasonal Allergies, Visual Disturbances and Yellow Eyes. Respiratory Present- Snoring. Not Present- Bloody sputum, Chronic Cough, Difficulty Breathing and Wheezing. Breast Present- Breast Pain. Not Present- Breast Mass, Nipple Discharge and Skin Changes. Cardiovascular Present- Difficulty Breathing Lying Down, Palpitations and Rapid Heart Rate. Not Present- Chest Pain, Leg Cramps, Shortness of Breath and Swelling of Extremities. Gastrointestinal Present- Abdominal Pain, Bloating, Chronic diarrhea, Constipation, Excessive gas, Indigestion and Nausea. Not Present- Bloody Stool, Change in Bowel Habits, Difficulty Swallowing, Gets full quickly at meals, Hemorrhoids, Rectal Pain and Vomiting. Female Genitourinary Not Present- Frequency, Nocturia, Painful Urination, Pelvic Pain and Urgency. Musculoskeletal Present- Joint Pain and Muscle Weakness. Not Present- Back Pain, Joint Stiffness, Muscle Pain and Swelling of Extremities. Neurological Not Present- Decreased Memory, Fainting, Headaches, Numbness, Seizures, Tingling, Tremor, Trouble walking and Weakness. Psychiatric Present- Anxiety, Change in Sleep Pattern, Depression, Fearful and Frequent crying. Not Present- Bipolar. Endocrine Present- Cold Intolerance, Hair Changes, Heat Intolerance and Hot flashes. Not Present- Excessive Hunger and New Diabetes. Hematology Not Present- Blood  Thinners, Easy Bruising, Excessive bleeding, Gland problems, HIV and Persistent Infections.   Physical Exam (Saysha Menta A. Zawadi Aplin MD; 09/17/2015 10:12 AM)  General Mental Status-Alert. General Appearance-Consistent with stated age. Hydration-Well hydrated. Voice-Normal.  Head and Neck Head-normocephalic, atraumatic with no lesions or palpable masses. Trachea-midline. Thyroid  Gland Characteristics - normal size and consistency. Note: scar noted  Chest and Lung Exam Chest and lung exam reveals -quiet, even and easy respiratory effort with no use of accessory muscles and on auscultation, normal breath sounds, no adventitious sounds and normal vocal resonance. Inspection Chest Wall - Normal. Back - normal.  Breast Breast - Left-Symmetric, Non Tender, No Biopsy scars, no Dimpling, No Inflammation, No Lumpectomy scars, No Mastectomy scars, No Peau d' Orange. Breast - Right-Symmetric, Non Tender, No Biopsy scars, no Dimpling, No Inflammation, No Lumpectomy scars, No Mastectomy scars, No Peau d' Orange. Breast Lump-No Palpable Breast Mass.  Cardiovascular Cardiovascular examination reveals -normal heart sounds, regular rate and rhythm with no murmurs and normal pedal pulses bilaterally.  Neurologic Neurologic evaluation reveals -alert and oriented x 3 with no impairment of recent or remote memory. Mental Status-Normal.  Musculoskeletal Normal Exam - Left-Upper Extremity Strength Normal and Lower Extremity Strength Normal. Normal Exam - Right-Upper Extremity Strength Normal and Lower Extremity Strength Normal.  Lymphatic Head & Neck  General Head & Neck Lymphatics: Bilateral - Description - Normal. Axillary  General Axillary Region: Bilateral - Description - Normal. Tenderness - Non Tender.    Assessment & Plan (Sonyia Muro A. Zabian Swayne MD; 09/17/2015 10:18 AM)  BREAST CANCER, LEFT (C50.912) Impression: multifocal   discussed mastectomy vs lumpectomy.  Discussed SLN mapping and port placement. Risk of lumpectomy include bleeding, infection, seroma, more surgery, use of seed/wire, wound care, cosmetic deformity and the need for other treatments, death , blood clots, death. Pt agrees to proceed. Risk of sentinel lymph node mapping include bleeding, infection, lymphedema, shoulder pain. stiffness, dye allergy. cosmetic deformity , blood clots, death, need for more surgery. Pt agres to proceed. Pt requires port placement for chemotherapy. Risk include bleeding, infection, pneumothorax, hemothorax, mediastinal injury, nerve injury , blood vessel injury, strke, blood clots, death, migration. embolization and need for additional procedures. Pt agrees to proceed.  Current Plans You are being scheduled for surgery - Our schedulers will call you.  You should hear from our office's scheduling department within 5 working days about the location, date, and time of surgery. We try to make accommodations for patient's preferences in scheduling surgery, but sometimes the OR schedule or the surgeon's schedule prevents [place] from making those accommodations.  If you have not heard from our office [phone]) in 5 working days, call the office and ask for your surgeon's nurse.  If you have other questions about your diagnosis, plan, or surgery, call the office and ask for your surgeon's nurse.  Pt Education - [facility] Breast Cancer Information Given - [facility] "Breast Journey" Package We discussed the staging and pathophysiology of breast cancer. We discussed all of the different options for treatment for breast cancer including surgery, chemotherapy, radiation therapy, Herceptin, and antiestrogen therapy. We discussed a sentinel lymph node biopsy as she does not appear to having lymph node involvement right now. We discussed the performance of that with injection of radioactive tracer and blue dye. We discussed that she would have an incision underneath her axillary hairline.  We discussed that there is a bout a 10-20% chance of having a positive node with a sentinel lymph node biopsy and we will await the permanent pathology to make any other first further decisions in terms of her treatment. One of these options might be to return to the operating room to perform an axillary lymph node dissection. We discussed about a 1-2% risk lifetime of chronic shoulder pain as well as lymphedema associated with  a sentinel lymph node biopsy. We discussed the options for treatment of the breast cancer which included lumpectomy versus a mastectomy. We discussed the performance of the lumpectomy with a wire placement. We discussed a 10-20% chance of a positive margin requiring reexcision in the operating room. We also discussed that she may need radiation therapy or antiestrogen therapy or both if she undergoes lumpectomy. We discussed the mastectomy and the postoperative care for that as well. We discussed that there is no difference in her survival whether she undergoes lumpectomy with radiation therapy or antiestrogen therapy versus a mastectomy. There is a slight difference in the local recurrence rate being 3-5% with lumpectomy and about 1% with a mastectomy. We discussed the risks of operation including bleeding, infection, possible reoperation. She understands her further therapy will be based on what her stages at the time of her operation.  Pt Education - [facility] breast cancer surgery: discussed with patient and provided information. Pt Education - [facility] Breast Biopsy HCI: discussed with patient and provided information. Pt Education - ABC (After Breast Cancer) Class Info: discussed with patient and provided information.

## 2015-09-17 NOTE — Assessment & Plan Note (Signed)
09/10/2015: Screening detected left breast asymmetry: 2 adjacent masses 4:00: 8 mm and 3:30: 9 mm; axilla negative, biopsy grade 2-3 IDC, ER 70%, PR 5%, Ki-67 10%, HER-2 positive ratio 3.41, T1BN0 stage IA  Pathology and radiology counseling: Discussed with the patient, the details of pathology including the type of breast cancer,the clinical staging, the significance of ER, PR and HER-2/neu receptors and the implications for treatment. After reviewing the pathology in detail, we proceeded to discuss the different treatment options between surgery, radiation, chemotherapy, antiestrogen therapies.  Recommendation based multidisciplinary tumor board: 1. Breast conserving surgery 2. Followed by adjuvant chemotherapy with either Taxol or Abraxane with Herceptin 3. Followed by adjuvant radiation 4. Followed by adjuvant antiestrogen therapy  Return to clinic after surgery to finalize a treatment plan.

## 2015-09-17 NOTE — Therapy (Signed)
[facility] [place], [place], [address] Phone: [phone]   Fax:  [phone]  Physical Therapy Evaluation  Patient Details  Name: Jenna [name] MRN: [id number] Date of Birth: 08/27/1960 Referring Provider: Dr. Erroll Luna  Encounter Date: 09/17/2015      PT End of Session - 09/17/15 1325    Visit Number 1   Number of Visits 1   PT Start Time 1005   PT Stop Time 1030   PT Time Calculation (min) 25 min   Activity Tolerance Patient tolerated treatment well   Behavior During Therapy [facility] for tasks assessed/performed      Past Medical History:  Diagnosis Date  . Anxiety   . Breast cancer of lower-outer quadrant of left female breast ([place]) 09/12/2015  . Hashimoto's disease   . Uterine displacement     Past Surgical History:  Procedure Laterality Date  . CHOLECYSTECTOMY OPEN    . disk fusion    . ENDOMETRIAL ABLATION    . neck fusion      There were no vitals filed for this visit.       Subjective Assessment - 09/17/15 1326    Subjective Patient reports she is here today to be seen by her medical team for her newly diagnosed left breast cancer.   Patient is accompained by: Family member   Pertinent History Patient was diagnosed on 08/22/15 with left triple positive grade 2-3 invasive ductal carcinoma breast cancer. There are 2 areas: 8 mm and 9 mm both located in the lower outer quadrant with a Ki67 of 10%.  She also had a cervical fusion in 2005.   Patient Stated Goals Reduce lymphedema risk and learn post op shoulder ROM HEP            [facility] PT Assessment - 09/17/15 0001      Assessment   Medical Diagnosis Left breast cancer   Referring Provider Dr. Marcello Moores [place]   Onset Date/Surgical Date 08/22/15   Hand Dominance Left   Prior Therapy none     Precautions   Precautions Other (comment)   Precaution Comments active breast cancer     Restrictions   Weight Bearing Restrictions No     Balance Screen   Has the patient fallen in the past 6 months No   Has the patient had a decrease in activity level because of a fear of falling?  No   Is the patient reluctant to leave their home because of a fear of falling?  No     Home Environment   Living Environment Private residence   Living Arrangements Spouse/significant other   Available Help at Discharge Family     Prior Function   Level of [place] Unemployed   Leisure She swims during the summer and is in the process of getting a recumbant bike     Cognition   Overall Cognitive Status Within Functional Limits for tasks assessed     Posture/Postural Control   Posture/Postural Control Postural limitations   Postural Limitations Rounded Shoulders;Forward head     ROM / Strength   AROM / PROM / Strength AROM;Strength     AROM   AROM Assessment Site Shoulder;Cervical   Right/Left Shoulder Right;Left   Right Shoulder Extension 50 Degrees   Right Shoulder Flexion 151 Degrees   Right Shoulder ABduction 170 Degrees   Right Shoulder Internal Rotation 69 Degrees   Right Shoulder External Rotation 80 Degrees   Left Shoulder  Extension 50 Degrees   Left Shoulder Flexion 152 Degrees   Left Shoulder ABduction 167 Degrees   Left Shoulder Internal Rotation 80 Degrees   Left Shoulder External Rotation 87 Degrees   Cervical Flexion WNL   Cervical Extension Limited 25%   Cervical - Right Side Bend Limited 25%   Cervical - Left Side Bend Limited 25%   Cervical - Right Rotation Limited 25%   Cervical - Left Rotation Limited 25%     Strength   Overall Strength Within functional limits for tasks performed           LYMPHEDEMA/ONCOLOGY QUESTIONNAIRE - 09/17/15 1321      Type   Cancer Type Left breast cancer     Lymphedema Assessments   Lymphedema Assessments Upper extremities     Right Upper Extremity Lymphedema   10 cm Proximal to Olecranon Process 27.3 cm   Olecranon Process 23.5 cm   10  cm Proximal to Ulnar Styloid Process 21.8 cm   Just Proximal to Ulnar Styloid Process 15.6 cm   Across Hand at [employer] 17 cm   At [place] of 2nd Digit 6.2 cm     Left Upper Extremity Lymphedema   10 cm Proximal to Olecranon Process 27.9 cm   Olecranon Process 23.2 cm   10 cm Proximal to Ulnar Styloid Process 20.8 cm   Just Proximal to Ulnar Styloid Process 15.2 cm   Across Hand at [employer] 17.9 cm   At [place] of 2nd Digit 6.2 cm      Patient was instructed today in a home exercise program today for post op shoulder range of motion. These included active assist shoulder flexion in sitting, scapular retraction, wall walking with shoulder abduction, and hands behind head external rotation.  She was encouraged to do these twice a day, holding 3 seconds and repeating 5 times when permitted by her physician.         PT Education - 09/17/15 1324    Education provided Yes   Education Details Lymphedema risk reduction and post op shoulder ROM HEP   Person(s) Educated Patient;Spouse   Methods Explanation;Demonstration;Handout   Comprehension Returned demonstration;Verbalized understanding              Breast Clinic Goals - 09/17/15 1332      Patient will be able to verbalize understanding of pertinent lymphedema risk reduction practices relevant to her diagnosis specifically related to skin care.   Time 1   Period Days   Status Achieved     Patient will be able to return demonstrate and/or verbalize understanding of the post-op home exercise program related to regaining shoulder range of motion.   Time 1   Period Days   Status Achieved     Patient will be able to verbalize understanding of the importance of attending the postoperative After Breast Cancer Class for further lymphedema risk reduction education and therapeutic exercise.   Time 1   Period Days   Status Achieved              Plan - 09/17/15 1326    Clinical Impression Statement Patient was  diagnosed on 08/22/15 with left triple positive grade 2-3 invasive ductal carcinoma breast cancer. There are 2 areas: 8 mm and 9 mm both located in the lower outer quadrant with a Ki67 of 10%.  She also had a cervical fusion in 2005.  Her multidisciplinary medical team met prior to her assessment to determine a recommended treatment plan.  She is planning to have a left lumpectomy and sentinel node biopsy followed by chemotherapy, radiation, and anti-estrogen therapy.  She may benefit from post op PT to regain shoulder ROM and reduce lymphedema risk.  Due to her lack of comorbidities, her eval of of low complexity.   Rehab Potential Excellent   Clinical Impairments Affecting Rehab Potential none   PT Frequency One time visit   PT Treatment/Interventions Patient/family education;Therapeutic exercise   PT Next Visit Plan Will f/u after surgery to determine PT needs   PT Home Exercise Plan Post op shoulder ROM HEP   Consulted and Agree with Plan of Care Patient;Family member/caregiver   Family Member Consulted Husband      Patient will benefit from skilled therapeutic intervention in order to improve the following deficits and impairments:  Postural dysfunction, Decreased knowledge of precautions, Pain, Impaired UE functional use, Decreased range of motion  Visit Diagnosis: Carcinoma of lower outer quadrant of breast, left (HCC) - Plan: PT plan of care cert/re-cert  Abnormal posture - Plan: PT plan of care cert/re-cert   Patient will follow up at outpatient cancer rehab if needed following surgery.  If the patient requires physical therapy at that time, a specific plan will be dictated and sent to the referring physician for approval. The patient was educated today on appropriate basic range of motion exercises to begin post operatively and the importance of attending the After Breast Cancer class following surgery.  Patient was educated today on lymphedema risk reduction practices as it pertains to  recommendations that will benefit the patient immediately following surgery.  She verbalized good understanding.  No additional physical therapy is indicated at this time.      Problem List Patient Active Problem List   Diagnosis Date Noted  . Breast cancer of lower-outer quadrant of left female breast ([place]) 09/12/2015  . Left breast mass 09/02/2015  . Multiple food allergies 08/19/2015  . Constipation 08/15/2015  . Family history of systemic lupus erythematosus 08/15/2015  . Bloating 08/15/2015  . Hypertriglyceridemia 08/13/2015  . Insomnia 12/11/2014  . Irritability and anger 12/11/2014  . Anxiety and depression 12/11/2014  . Post-menopausal 10/29/2014  . Hashimoto's thyroiditis 02/25/2014  . GERD (gastroesophageal reflux disease) 02/25/2014  . Allergic rhinitis 02/25/2014  . Uterine fibroid 02/25/2014  . Hyperlipidemia 02/25/2014  . Multiple joint pain 02/25/2014    Annia Friendly, PT 09/17/15 1:35 PM  [place], [place], [address] Phone: [phone]   Fax:  [phone]  Name: Hadasa Gasner MRN: [id number] Date of Birth: [birth year]

## 2015-09-17 NOTE — Progress Notes (Signed)
Radiation Oncology         [phone] ________________________________  Initial Outpatient Consultation  Name: Jenna [name] MRN: [id number]  Date: 09/17/2015  DOB: 05/26/1960  WU:JWJX Breeback, PA-C  Breeback, Royetta Car, PA-C   REFERRING PHYSICIAN: Donella Stade, PA-C  DIAGNOSIS: The encounter diagnosis was Breast cancer of lower-outer quadrant of left female breast ([place]).  Clinical, T1b Nx Mx, grade 3 invasive ductal carcinoma of the left breast (triple positive)  HISTORY OF PRESENT ILLNESS::Jenna [name] is a 55 y.o. female who had a screening mammogram on 08/22/15 revealing a possible asymmetry in the left breast.  Diagnostic mammogram of the left breast on 09/09/15 showed 2.0 x 0.8 cm mass with associated distortion. On physical exam, no abnormalities were palpated in the breast. Ultrasound of the breast showed 2 adjacent masses with mild acoustic shadowing. The first mass was 0.8 x 0.6 x 0.5 cm in the 4:00 position 7 cm from the nipple. The second mass was 0.9 x 0.6 x 0.7 cm in the 3:30 position 6 cm from the nipple with internal vascularity. Ultrasound of the left axilla was negative.  Biopsy of the left breast mass in the 3:30 position on 09/10/15 revealed grade 3 invasive ductal carcinoma (ER 70% positive, PR 5% positive, HER2 positive, Ki67 10%).  The patient presents today in multidisciplinary breast clinic to discuss treatment options for the management of her disease.  PREVIOUS RADIATION THERAPY: No  PAST MEDICAL HISTORY:  has a past medical history of Anxiety; Breast cancer of lower-outer quadrant of left female breast ([place]) (09/12/2015); Hashimoto's disease; and Uterine displacement.    PAST SURGICAL HISTORY: Past Surgical History:  Procedure Laterality Date  . CHOLECYSTECTOMY OPEN    . disk fusion    . ENDOMETRIAL ABLATION    . neck fusion      FAMILY HISTORY: family history includes Cancer in her maternal grandfather; Diabetes in her mother; Hyperlipidemia in her  mother; Hypertension in her mother; Kidney disease in her mother.  SOCIAL HISTORY:  reports that she has quit smoking. She has never used smokeless tobacco. She reports that she drinks about 0.6 oz of alcohol per week . She reports that she does not use drugs.  ALLERGIES: Sulfa antibiotics  MEDICATIONS:  Current Outpatient Prescriptions  Medication Sig Dispense Refill  . Albuterol Sulfate (PROAIR RESPICLICK) 914 (90 BASE) MCG/ACT AEPB Inhale 2 puffs into the lungs every 6 (six) hours. (Patient not taking: Reported on 09/17/2015) 3 each 3  . aspirin EC 81 MG tablet Take 81 mg by mouth daily.    . celecoxib (CELEBREX) 200 MG capsule Take 1 capsule (200 mg total) by mouth 2 (two) times daily. 180 capsule 2  . Cholecalciferol (VITAMIN D3) 2000 units capsule Take by mouth.    . DULoxetine (CYMBALTA) 30 MG capsule Take 1 capsule (30 mg total) by mouth daily. 30 capsule 1  . Lactobacillus (DIGESTIVE HEALTH PROBIOTIC PO) Take by mouth.    . levothyroxine (SYNTHROID, LEVOTHROID) 50 MCG tablet TAKE 1 TABLET (50 MCG TOTAL) BY MOUTH DAILY. 90 tablet 1  . linaclotide (LINZESS) 290 MCG CAPS capsule Take 1 capsule (290 mcg total) by mouth daily. 30 capsule 1  . liothyronine (CYTOMEL) 25 MCG tablet TAKE 1 TABLET (25 MCG TOTAL) BY MOUTH DAILY. 30 tablet 0  . Multiple Vitamins-Minerals (WOMENS MULTIVITAMIN PLUS PO) Take by mouth daily.    . Omega-3 Fatty Acids (SALMON OIL PO) Take 400 mg by mouth daily.    Marland Kitchen omeprazole (PRILOSEC) 20 MG capsule  Take 1 capsule (20 mg total) by mouth 2 (two) times daily before a meal. 180 capsule 4  . Oxymetazoline HCl (NASAL SPRAY NA) Place into the nose.    . Polyethylene Glycol 3350 (MIRALAX PO) Take by mouth.    . Simethicone (GAS-X PO) Take by mouth.    . triamcinolone (NASACORT) 55 MCG/ACT AERO nasal inhaler Place into the nose.     No current facility-administered medications for this encounter.     REVIEW OF SYSTEMS:  A 15 point review of systems is documented in the  electronic medical record. This was obtained by the nursing staff. However, I reviewed this with the patient to discuss relevant findings and make appropriate changes.  Pertinent items noted in HPI and remainder of comprehensive ROS otherwise negative.  Gynecologic History  Age at first menstrual period? 11  Are you still having periods? No  If you no longer have periods: Have you used hormone replacement? No Obstetric History:  How many children have you carried to term? 2 Your age at first live birth? 64  Pregnant now or trying to get pregnant? No  Have you used birth control pills or hormone shots for contraception? Yes  If so, for how long (or approximate dates)? 2014 Health Maintenance:  Have you ever had a colonoscopy? Yes If yes, date? 2015  Have you ever had a bone density? No  Date of your last PAP smear? 2016 Date of your FIRST mammogram? 2013  The patient reports night sweats, weight changes, loss of sleep, fatigue, left breast pain, wears glasses, sinus problems, heart palpations, nausea, heartburn, change in stool habits, back pain, headaches, anxiety, depression, and thyroid problems.  PHYSICAL EXAM:  Vitals with BMI 09/17/2015  Height '5\' 2"'$   Weight 162 lbs 11 oz  BMI 67.6  Systolic 195  Diastolic 52  Pulse 71  Respirations 20   General: Alert and oriented, in no acute distress HEENT: Head is normocephalic. Extraocular movements are intact. Oropharynx is clear. Neck: Neck is supple, no palpable cervical or supraclavicular lymphadenopathy. Heart: Regular in rate and rhythm with no murmurs, rubs, or gallops. Chest: Clear to auscultation bilaterally, with no rhonchi, wheezes, or rales. Abdomen: Soft, nontender, nondistended, with no rigidity or guarding. Extremities: No cyanosis or edema. Lymphatics: see Neck Exam Skin: No concerning lesions. Musculoskeletal: symmetric strength and muscle tone throughout. Psychiatric: Judgment and insight are intact. Affect is  appropriate. Breast: Left breast has a biopsy site in the 3-4:00 position with associated bruising. No dominant mass appreciated in the breast, no nipple discharge/bleeding. Examination of the right breast revealed no palpable mass or nipple discharge.  ECOG = 0  LABORATORY DATA:  Lab Results  Component Value Date   WBC 6.0 09/17/2015   HGB 12.7 09/17/2015   HCT 38.9 09/17/2015   MCV 92.2 09/17/2015   PLT 230 09/17/2015   NEUTROABS 3.2 09/17/2015   Lab Results  Component Value Date   NA 142 09/17/2015   K 4.2 09/17/2015   CL 102 08/12/2015   CO2 28 09/17/2015   GLUCOSE 90 09/17/2015   CREATININE 0.7 09/17/2015   CALCIUM 9.3 09/17/2015      RADIOGRAPHY: Mm Digital Diagnostic Unilat L  Result Date: 09/10/2015 CLINICAL DATA:  Evaluate placement of 2 biopsy clips following biopsies of 2 adjacent nodules in the lower outer left breast. EXAM: DIAGNOSTIC LEFT MAMMOGRAM POST ULTRASOUND BIOPSY COMPARISON:  Previous exam(s). FINDINGS: Mammographic images were obtained following ultrasound guided biopsy of 2 adjacent suspicious nodules at the 4  o'clock position of the left breast 7 cm from the nipple. The coil shaped clip placed in the region of the inferior nodule and the heart shaped clip placed in the region of the superior nodule are identified in separated by approximately 2 cm. The coil shaped clip is felt to represent the area biopsied, with the heart shaped clip likely slightly migrated anteriorly. IMPRESSION: Two biopsy clips placed in the region of 2 suspicious nodules biopsied by ultrasound guidance. The coil shaped clip is felt to correspond to the area biopsied and the heart shaped clip is probably slightly migrated anteriorly. Final Assessment: Post Procedure Mammograms for Marker Placement Electronically Signed   By: Margarette Canada M.D.   On: 09/10/2015 16:34   Mm Digital Screening Bilateral  Result Date: 09/02/2015 CLINICAL DATA:  Screening. EXAM: DIGITAL SCREENING BILATERAL  MAMMOGRAM WITH CAD COMPARISON:  Previous exam(s). ACR Breast Density Category b: There are scattered areas of fibroglandular density. FINDINGS: In the left breast, a possible asymmetry warrants further evaluation. In the right breast, no findings suspicious for malignancy. Images were processed with CAD. IMPRESSION: Further evaluation is suggested for possible asymmetry in the left breast. RECOMMENDATION: Diagnostic mammogram and possibly ultrasound of the left breast. (Code:[id number]) The patient will be contacted regarding the findings, and additional imaging will be scheduled. BI-RADS CATEGORY  0: Incomplete. Need additional imaging evaluation and/or prior mammograms for comparison. Electronically Signed   By: Ammie Ferrier M.D.   On: 09/02/2015 09:05   US Breast Ltd Uni Left Inc Axilla  Result Date: 09/11/2015 CLINICAL DATA:  Patient returns after screening study for evaluation of possible left breast asymmetry. EXAM: 2D DIGITAL DIAGNOSTIC LEFT MAMMOGRAM WITH CAD AND ADJUNCT TOMO ULTRASOUND LEFT BREAST COMPARISON:  08/22/2015 and earlier ACR Breast Density Category b: There are scattered areas of fibroglandular density. FINDINGS: Within the lower outer quadrant of the left breast there is an irregular mass measuring approximately 2.0 x 0.8 cm. There is associated distortion. Mammographic images were processed with CAD. On physical exam, I palpate no abnormality in the lower outer quadrant of the left breast. Targeted ultrasound is performed, showing 2 adjacent irregular hypoechoic masses with mild acoustic shadowing. The versus in the 4 o'clock location 7 cm from nipple measuring 0.8 x 0.6 x 0.5 cm. The second is in the 330 o'clock location 6 cm from nipple measuring 0.9 x 0.6 by 0.7 cm. There is internal vascularity associated with this second lesion. Evaluation of the axilla is negative for adenopathy. IMPRESSION: Persistent distortion/ mass in the lower outer quadrant of the left breast corresponds to 2  adjacent suspicious masses in the 330 o'clock 4 o'clock locations by ultrasound. No evidence for adenopathy. RECOMMENDATION: Ultrasound-guided core biopsy of each lesion is recommended. I have discussed the findings and recommendations with the patient. Results were also provided in writing at the conclusion of the visit. If applicable, a reminder letter will be sent to the patient regarding the next appointment. BI-RADS CATEGORY  4: Suspicious. Electronically Signed   By: Nolon Nations M.D.   On: 09/09/2015 18:22   Mm Diag Breast Tomo Uni Left  Result Date: 09/09/2015 CLINICAL DATA:  Patient returns after screening study for evaluation of possible left breast asymmetry. EXAM: 2D DIGITAL DIAGNOSTIC LEFT MAMMOGRAM WITH CAD AND ADJUNCT TOMO ULTRASOUND LEFT BREAST COMPARISON:  08/22/2015 and earlier ACR Breast Density Category b: There are scattered areas of fibroglandular density. FINDINGS: Within the lower outer quadrant of the left breast there is an irregular mass measuring approximately 2.0  x 0.8 cm. There is associated distortion. Mammographic images were processed with CAD. On physical exam, I palpate no abnormality in the lower outer quadrant of the left breast. Targeted ultrasound is performed, showing 2 adjacent irregular hypoechoic masses with mild acoustic shadowing. The versus in the 4 o'clock location 7 cm from nipple measuring 0.8 x 0.6 x 0.5 cm. The second is in the 330 o'clock location 6 cm from nipple measuring 0.9 x 0.6 by 0.7 cm. There is internal vascularity associated with this second lesion. Evaluation of the axilla is negative for adenopathy. IMPRESSION: Persistent distortion/ mass in the lower outer quadrant of the left breast corresponds to 2 adjacent suspicious masses in the 330 o'clock 4 o'clock locations by ultrasound. No evidence for adenopathy. RECOMMENDATION: Ultrasound-guided core biopsy of each lesion is recommended. I have discussed the findings and recommendations with the  patient. Results were also provided in writing at the conclusion of the visit. If applicable, a reminder letter will be sent to the patient regarding the next appointment. BI-RADS CATEGORY  4: Suspicious. Electronically Signed   By: Nolon Nations M.D.   On: 09/09/2015 18:22   [place] Lt Breast Bx W Loc Dev 1st Lesion Img Bx Spec US Guide  Addendum Date: 09/12/2015   ADDENDUM REPORT: 09/11/2015 11:05 ADDENDUM: Pathology revealed grade III invasive ductal carcinoma in the left breast. This was found to be concordant by Dr. Hassan Rowan. Pathology results were discussed with the patient by telephone. The patient reported doing well after the biopsy with tenderness at the site. Post biopsy instructions and care were reviewed and questions were answered. The patient was encouraged to call [facility] for any additional concerns. The patient was referred to the [facility] at the [facility] on September 17, 2015. Pathology results reported by Susa Raring RN, BSN on 09/11/2015. Electronically Signed   By: Margarette Canada M.D.   On: 09/11/2015 11:05   Result Date: 09/12/2015 CLINICAL DATA:  55 year old female with 2 adjacent suspicious masses in the lower outer left breast. For tissue sampling. EXAM: ULTRASOUND GUIDED LEFT BREAST CORE NEEDLE BIOPSY COMPARISON:  Previous exam(s). FINDINGS: I met with the patient and we discussed the procedure of ultrasound-guided biopsy, including benefits and alternatives. We discussed the high likelihood of a successful procedure. We discussed the risks of the procedure, including infection, bleeding, tissue injury, clip migration, and inadequate sampling. Informed written consent was given. The usual time-out protocol was performed immediately prior to the procedure. Using sterile technique and 1% Lidocaine as local anesthetic, under direct ultrasound visualization, a 12 gauge spring-loaded device was used to perform  biopsy of both masses using a medial approach. Samples were submitted in the same container as it was difficult to sample the nodules separately given position and technique. At the conclusion of the procedure a coil shaped tissue marker clip was deployed into the biopsy cavity of the inferior nodule and a heart shaped clip was deployed into the biopsy cavity of the superior nodule. Follow up 2 view mammogram was performed and dictated separately. IMPRESSION: Ultrasound guided biopsy of 2 adjacent suspicious masses within the outer left breast. Please note that the samples were submitted in 1 container as I could not sample the nodules separately. No apparent complications. Pathology will be followed. Electronically Signed: By: Margarette Canada M.D. On: 09/10/2015 16:26      IMPRESSION: Clinical, T1b Nx Mx, grade 3 invasive ductal carcinoma of the left breast (triple  positive)  The patient would be a good candidate for breast conservation with radiation therapy. Due to her cancer being triple positive, she would require chemotherapy (Herceptin based). The patient would also be a good candidate for adjuvant hormonal therapy after the completion of radiation.  PLAN: The patient will undergo a left lumpectomy and sentinel lymph node biopsy, an echocardiogram, participate in chemo class, and chemotherapy. After the completion of chemotherapy, she will then return to radiation oncology to further discuss radiation.  ------------------------------------------------  Blair Promise, PhD, MD  This document serves as a record of services personally performed by Gery Pray, MD. It was created on his behalf by Darcus Austin, a trained medical scribe. The creation of this record is based on the scribe's personal observations and the provider's statements to them. This document has been checked and approved by the attending provider.

## 2015-09-17 NOTE — Progress Notes (Signed)
Clinical Social Work [place] Psychosocial Distress Screening [place]  Patient completed distress screening protocol and scored a 9 on the Psychosocial Distress Thermometer which indicates severe distress. Clinical Social Worker met with patient and patients family in [facility] to assess for distress and other psychosocial needs. Patient stated she was feeling overwhelmed but felt "better" after meeting with the treatment team and getting more information on her treatment plan. CSW and patient discussed common feeling and emotions when being diagnosed with cancer, and the importance of support during treatment. CSW informed patient of the support team and support services at [facility], and patient was agreeable to an Network engineer. CSW provided contact information and encouraged patient to call with any questions or concerns.  ONCBCN DISTRESS SCREENING 09/17/2015  Screening Type Initial Screening  Distress experienced in past week (1-10) 9  Practical problem type Insurance  Emotional problem type Depression;Nervousness/Anxiety;Adjusting to illness;Adjusting to appearance changes  Spiritual/Religous concerns type Facing my mortality  Information Concerns Type Lack of info about diagnosis;Lack of info about treatment  Physical Problem type Pain;Nausea/vomiting;Sleep/insomnia;Constipation/diarrhea  Physician notified of physical symptoms Yes  Referral to clinical psychology No  Referral to clinical social work Yes  Referral to dietition No  Referral to financial advocate No  Referral to support programs Yes  Referral to palliative care No      Johnnye Lana, MSW, LCSW, OSW-C Clinical Social Worker [place] [phone]

## 2015-09-17 NOTE — Progress Notes (Signed)
[place] CONSULT NOTE  Patient Care Team: Donella Stade, PA-C as PCP - General (Family Medicine) Erroll Luna, MD as Consulting Physician (General Surgery) Nicholas Lose, MD as Consulting Physician (Hematology and Oncology) Gery Pray, MD as Consulting Physician (Radiation Oncology)  CHIEF COMPLAINTS/PURPOSE OF CONSULTATION:  Newly diagnosed breast cancer  HISTORY OF PRESENTING ILLNESS:  Jenna [name] 55 y.o. female is here because of recent diagnosis of left breast cancer. Patient had a routine screening mammogram the detected a left breast cancer. She underwent additional testing with the mammograms and ultrasound that detected 2 adjacent masses 4:00 position 8 mm in size and 3:30 position 9 mm in size. Axilla was negative by ultrasound. Biopsy of the mass revealed that it was a grade 2-3 invasive ductal carcinoma that was ER positive PR negative Ki-67 10% and HER-2 positive with a ratio of 3.41. She was presented at the multidisciplinary tumor board and she is here today to discuss the treatment plan.   I reviewed her records extensively and collaborated the history with the patient.  SUMMARY OF ONCOLOGIC HISTORY:   Breast cancer of lower-outer quadrant of left female breast ([place])   09/10/2015 Initial Diagnosis    Screening detected left breast asymmetry: 2 adjacent masses 4:00: 8 mm and 3:30: 9 mm; axilla negative, biopsy grade 2-3 IDC, ER 70%, PR 5%, Ki-67 10%, HER-2 positive ratio 3.41, T1BN0 stage IA       MEDICAL HISTORY:  Past Medical History:  Diagnosis Date  . Anxiety   . Breast cancer of lower-outer quadrant of left female breast ([place]) 09/12/2015  . Hashimoto's disease   . Uterine displacement     SURGICAL HISTORY: Past Surgical History:  Procedure Laterality Date  . CHOLECYSTECTOMY OPEN    . disk fusion    . ENDOMETRIAL ABLATION    . neck fusion      SOCIAL HISTORY: Social History   Social History  . Marital status: Married    Spouse  name: N/A  . Number of children: N/A  . Years of education: N/A   Occupational History  . Not on file.   Social History Main Topics  . Smoking status: Former Research scientist (life sciences)  . Smokeless tobacco: Never Used  . Alcohol use 0.6 oz/week    1 Standard drinks or equivalent per week  . Drug use: No  . Sexual activity: Yes    Partners: Male   Other Topics Concern  . Not on file   Social History Narrative  . No narrative on file    FAMILY HISTORY: Family History  Problem Relation Age of Onset  . Diabetes Mother   . Hypertension Mother   . Hyperlipidemia Mother   . Kidney disease Mother   . Cancer Maternal Grandfather     ALLERGIES:  is allergic to sulfa antibiotics.  MEDICATIONS:  Current Outpatient Prescriptions  Medication Sig Dispense Refill  . aspirin EC 81 MG tablet Take 81 mg by mouth daily.    . celecoxib (CELEBREX) 200 MG capsule Take 1 capsule (200 mg total) by mouth 2 (two) times daily. 180 capsule 2  . Cholecalciferol (VITAMIN D3) 2000 units capsule Take by mouth.    . DULoxetine (CYMBALTA) 30 MG capsule Take 1 capsule (30 mg total) by mouth daily. 30 capsule 1  . levothyroxine (SYNTHROID, LEVOTHROID) 50 MCG tablet TAKE 1 TABLET (50 MCG TOTAL) BY MOUTH DAILY. 90 tablet 1  . linaclotide (LINZESS) 290 MCG CAPS capsule Take 1 capsule (290 mcg total) by mouth daily.  30 capsule 1  . liothyronine (CYTOMEL) 25 MCG tablet TAKE 1 TABLET (25 MCG TOTAL) BY MOUTH DAILY. 30 tablet 0  . Multiple Vitamins-Minerals (WOMENS MULTIVITAMIN PLUS PO) Take by mouth daily.    . Omega-3 Fatty Acids (SALMON OIL PO) Take 400 mg by mouth daily.    Marland Kitchen omeprazole (PRILOSEC) 20 MG capsule Take 1 capsule (20 mg total) by mouth 2 (two) times daily before a meal. 180 capsule 4  . Simethicone (GAS-X PO) Take by mouth.    . triamcinolone (NASACORT) 55 MCG/ACT AERO nasal inhaler Place into the nose.    . Albuterol Sulfate (PROAIR RESPICLICK) 656 (90 BASE) MCG/ACT AEPB Inhale 2 puffs into the lungs every 6  (six) hours. (Patient not taking: Reported on 09/17/2015) 3 each 3  . Lactobacillus (DIGESTIVE HEALTH PROBIOTIC PO) Take by mouth.    . Oxymetazoline HCl (NASAL SPRAY NA) Place into the nose.    . Polyethylene Glycol 3350 (MIRALAX PO) Take by mouth.     No current facility-administered medications for this visit.     REVIEW OF SYSTEMS:   Constitutional: Denies fevers, chills or abnormal night sweats Eyes: Denies blurriness of vision, double vision or watery eyes Ears, nose, mouth, throat, and face: Denies mucositis or sore throat Respiratory: Denies cough, dyspnea or wheezes Cardiovascular: Denies palpitation, chest discomfort or lower extremity swelling Gastrointestinal:  Denies nausea, heartburn or change in bowel habits Skin: Denies abnormal skin rashes Lymphatics: Denies new lymphadenopathy or easy bruising Neurological:Denies numbness, tingling or new weaknesses Behavioral/Psych: Mood is stable, no new changes  Breast:  Denies any palpable lumps or discharge All other systems were reviewed with the patient and are negative.  PHYSICAL EXAMINATION: ECOG PERFORMANCE STATUS: 0 - Asymptomatic  Vitals:   09/17/15 0840  BP: (!) 137/52  Pulse: 71  Resp: 20  Temp: 98 F (36.7 C)   Filed Weights   09/17/15 0840  Weight: 162 lb 11.2 oz (73.8 kg)    GENERAL:alert, no distress and comfortable SKIN: skin color, texture, turgor are normal, no rashes or significant lesions EYES: normal, conjunctiva are pink and non-injected, sclera clear OROPHARYNX:no exudate, no erythema and lips, buccal mucosa, and tongue normal  NECK: supple, thyroid normal size, non-tender, without nodularity LYMPH:  no palpable lymphadenopathy in the cervical, axillary or inguinal LUNGS: clear to auscultation and percussion with normal breathing effort HEART: regular rate & rhythm and no murmurs and no lower extremity edema ABDOMEN:abdomen soft, non-tender and normal bowel sounds Musculoskeletal:no cyanosis of  digits and no clubbing  PSYCH: alert & oriented x 3 with fluent speech NEURO: no focal motor/sensory deficits  LABORATORY DATA:  I have reviewed the data as listed Lab Results  Component Value Date   WBC 6.0 09/17/2015   HGB 12.7 09/17/2015   HCT 38.9 09/17/2015   MCV 92.2 09/17/2015   PLT 230 09/17/2015   Lab Results  Component Value Date   NA 142 09/17/2015   K 4.2 09/17/2015   CL 102 08/12/2015   CO2 28 09/17/2015   RADIOGRAPHIC STUDIES: I have personally reviewed the radiological reports and agreed with the findings in the report.  ASSESSMENT AND PLAN:  Breast cancer of lower-outer quadrant of left female breast ([place]) 09/10/2015: Screening detected left breast asymmetry: 2 adjacent masses 4:00: 8 mm and 3:30: 9 mm; axilla negative, biopsy grade 2-3 IDC, ER 70%, PR 5%, Ki-67 10%, HER-2 positive ratio 3.41, T1BN0 stage IA  Pathology and radiology counseling: Discussed with the patient, the details of pathology including  the type of breast cancer,the clinical staging, the significance of ER, PR and HER-2/neu receptors and the implications for treatment. After reviewing the pathology in detail, we proceeded to discuss the different treatment options between surgery, radiation, chemotherapy, antiestrogen therapies.  Recommendation based multidisciplinary tumor board: 1. Breast conserving surgery 2. Followed by adjuvant chemotherapy with either Taxol or Abraxane with Herceptin Weekly 12 followed by Herceptin every 3 weeks for 1 year 3. Followed by adjuvant radiation 4. Followed by adjuvant antiestrogen therapy  I discussed with her that anti-HER-2 therapy with chemotherapy could be beneficial for tumors that are greater than 5 mm. Patient was inquiring that even if her tumor was less than 5 mm she would be willing to go through chemotherapy and Herceptin. For tumors that are less than 5 mm this would be considered to be an optional treatment.  Chemotherapy counseling: I discussed  this and benefits of Taxol/Abraxane and Herceptin with the patient. The risks include fatigue, hair loss, nausea, lowering of blood counts, risk of infection, neuropathy risk area risks of Herceptin include reversible cardiomyopathy were also discussed. Patient will undergo port placement during surgery.  Return to clinic after surgery to finalize a treatment plan.    All questions were answered. The patient knows to call the clinic with any problems, questions or concerns.    Rulon Eisenmenger, MD 09/17/15

## 2015-09-19 ENCOUNTER — Other Ambulatory Visit: Payer: Self-pay | Admitting: Surgery

## 2015-09-19 ENCOUNTER — Encounter (HOSPITAL_BASED_OUTPATIENT_CLINIC_OR_DEPARTMENT_OTHER): Payer: Self-pay | Admitting: *Deleted

## 2015-09-19 ENCOUNTER — Telehealth: Payer: Self-pay | Admitting: *Deleted

## 2015-09-19 DIAGNOSIS — C50112 Malignant neoplasm of central portion of left female breast: Secondary | ICD-10-CM

## 2015-09-19 MED ORDER — LIOTHYRONINE SODIUM 25 MCG PO TABS: 25.0000 ug | ORAL_TABLET | Freq: Every day | ORAL | 0 refills | Status: DC

## 2015-09-19 NOTE — Telephone Encounter (Signed)
Spoke to pt concerning [place] from 09/17/15. Denies questions or concerns regarding dx or treatment care plan. Discussed further appts. Informed pt that she does not need genetic counseling and cancelled appts. Encourage pt to call with needs. Received verbal understanding.

## 2015-09-22 ENCOUNTER — Other Ambulatory Visit: Payer: Self-pay | Admitting: Physician Assistant

## 2015-09-23 ENCOUNTER — Telehealth: Payer: Self-pay | Admitting: *Deleted

## 2015-09-23 ENCOUNTER — Other Ambulatory Visit: Payer: BLUE CROSS/BLUE SHIELD

## 2015-09-23 ENCOUNTER — Other Ambulatory Visit: Payer: Self-pay | Admitting: Physician Assistant

## 2015-09-23 ENCOUNTER — Encounter: Payer: Self-pay | Admitting: *Deleted

## 2015-09-23 NOTE — Telephone Encounter (Signed)
  Oncology Nurse Navigator Documentation  Navigator Location: [facility] (09/23/15 1100) Navigator Encounter Type: Telephone (09/23/15 1100) Telephone: Lahoma Crocker Call;Appt Confirmation/Clarification (09/23/15 1100)     Surgery Date: 09/25/15 (09/23/15 1100) Treatment Initiated Date: 09/25/15 (09/23/15 1100)                    Specialty Items/DME: Wigs (09/23/15 1100)           Time Spent with Patient: 30 (09/23/15 1100)

## 2015-09-24 ENCOUNTER — Encounter: Payer: BLUE CROSS/BLUE SHIELD | Admitting: Genetic Counselor

## 2015-09-24 ENCOUNTER — Other Ambulatory Visit: Payer: BLUE CROSS/BLUE SHIELD

## 2015-09-25 ENCOUNTER — Encounter (HOSPITAL_BASED_OUTPATIENT_CLINIC_OR_DEPARTMENT_OTHER): Payer: Self-pay | Admitting: Anesthesiology

## 2015-09-25 ENCOUNTER — Other Ambulatory Visit: Payer: Self-pay | Admitting: Surgery

## 2015-09-25 ENCOUNTER — Ambulatory Visit
Admission: RE | Admit: 2015-09-25 | Discharge: 2015-09-25 | Disposition: A | Discharge status: Home / Self Care | Payer: BLUE CROSS/BLUE SHIELD | Source: Ambulatory Visit | Attending: Surgery | Admitting: Surgery

## 2015-09-25 ENCOUNTER — Ambulatory Visit (HOSPITAL_BASED_OUTPATIENT_CLINIC_OR_DEPARTMENT_OTHER): Payer: BLUE CROSS/BLUE SHIELD | Admitting: Anesthesiology

## 2015-09-25 ENCOUNTER — Other Ambulatory Visit: Payer: Self-pay

## 2015-09-25 ENCOUNTER — Ambulatory Visit (HOSPITAL_COMMUNITY): Payer: BLUE CROSS/BLUE SHIELD

## 2015-09-25 ENCOUNTER — Ambulatory Visit
Admit: 2015-09-25 | Discharge: 2015-09-25 | Disposition: A | Discharge status: Home / Self Care | Payer: BLUE CROSS/BLUE SHIELD | Attending: Surgery | Admitting: Surgery

## 2015-09-25 ENCOUNTER — Ambulatory Visit (HOSPITAL_BASED_OUTPATIENT_CLINIC_OR_DEPARTMENT_OTHER)
Admission: RE | Admit: 2015-09-25 | Discharge: 2015-09-25 | Disposition: A | Discharge status: Home / Self Care | Payer: BLUE CROSS/BLUE SHIELD | Source: Ambulatory Visit | Attending: Surgery | Admitting: Surgery

## 2015-09-25 ENCOUNTER — Encounter (HOSPITAL_BASED_OUTPATIENT_CLINIC_OR_DEPARTMENT_OTHER)
Admission: RE | Disposition: A | Discharge status: Home / Self Care | Payer: Self-pay | Source: Ambulatory Visit | Attending: Surgery

## 2015-09-25 ENCOUNTER — Encounter (HOSPITAL_COMMUNITY)
Admission: RE | Admit: 2015-09-25 | Discharge: 2015-09-25 | Disposition: A | Discharge status: Home / Self Care | Payer: BLUE CROSS/BLUE SHIELD | Source: Ambulatory Visit | Attending: Surgery | Admitting: Surgery

## 2015-09-25 DIAGNOSIS — C50912 Malignant neoplasm of unspecified site of left female breast: Secondary | ICD-10-CM | POA: Diagnosis not present

## 2015-09-25 DIAGNOSIS — C50112 Malignant neoplasm of central portion of left female breast: Secondary | ICD-10-CM

## 2015-09-25 DIAGNOSIS — J45909 Unspecified asthma, uncomplicated: Secondary | ICD-10-CM | POA: Diagnosis not present

## 2015-09-25 DIAGNOSIS — N63 Unspecified lump in breast: Secondary | ICD-10-CM | POA: Diagnosis not present

## 2015-09-25 DIAGNOSIS — F419 Anxiety disorder, unspecified: Secondary | ICD-10-CM | POA: Diagnosis not present

## 2015-09-25 DIAGNOSIS — Z95828 Presence of other vascular implants and grafts: Secondary | ICD-10-CM

## 2015-09-25 DIAGNOSIS — Z87891 Personal history of nicotine dependence: Secondary | ICD-10-CM | POA: Insufficient documentation

## 2015-09-25 DIAGNOSIS — K219 Gastro-esophageal reflux disease without esophagitis: Secondary | ICD-10-CM | POA: Insufficient documentation

## 2015-09-25 DIAGNOSIS — Z452 Encounter for adjustment and management of vascular access device: Secondary | ICD-10-CM | POA: Diagnosis not present

## 2015-09-25 DIAGNOSIS — D0512 Intraductal carcinoma in situ of left breast: Secondary | ICD-10-CM | POA: Diagnosis not present

## 2015-09-25 DIAGNOSIS — R079 Chest pain, unspecified: Secondary | ICD-10-CM | POA: Diagnosis not present

## 2015-09-25 DIAGNOSIS — E039 Hypothyroidism, unspecified: Secondary | ICD-10-CM | POA: Diagnosis not present

## 2015-09-25 DIAGNOSIS — G8918 Other acute postprocedural pain: Secondary | ICD-10-CM | POA: Diagnosis not present

## 2015-09-25 HISTORY — DX: Gastro-esophageal reflux disease without esophagitis: K21.9

## 2015-09-25 HISTORY — DX: Unspecified asthma, uncomplicated: J45.909

## 2015-09-25 HISTORY — PX: PORTACATH PLACEMENT: SHX2246

## 2015-09-25 HISTORY — DX: Other complications of anesthesia, initial encounter: T88.59XA

## 2015-09-25 HISTORY — DX: Hypothyroidism, unspecified: E03.9

## 2015-09-25 HISTORY — DX: Adverse effect of unspecified anesthetic, initial encounter: T41.45XA

## 2015-09-25 HISTORY — PX: BREAST LUMPECTOMY: SHX2

## 2015-09-25 HISTORY — PX: BREAST LUMPECTOMY WITH NEEDLE LOCALIZATION AND AXILLARY SENTINEL LYMPH NODE BX: SHX5760

## 2015-09-25 HISTORY — DX: Irritable bowel syndrome, unspecified: K58.9

## 2015-09-25 SURGERY — BREAST LUMPECTOMY WITH NEEDLE LOCALIZATION AND AXILLARY SENTINEL LYMPH NODE BX
Anesthesia: Regional | Site: Chest | Laterality: Right

## 2015-09-25 MED ORDER — MIDAZOLAM HCL 2 MG/2ML IJ SOLN
1.0000 mg | INTRAMUSCULAR | Status: DC | PRN
  Administered 2015-09-25: 2 mg via INTRAVENOUS

## 2015-09-25 MED ORDER — DEXTROSE 5 % IV SOLN
3.0000 g | INTRAVENOUS | Status: AC
  Administered 2015-09-25: 3 g via INTRAVENOUS

## 2015-09-25 MED ORDER — TECHNETIUM TC 99M SULFUR COLLOID FILTERED
1.0000 | Freq: Once | INTRAVENOUS | Status: AC | PRN
  Administered 2015-09-25: 1 via INTRADERMAL

## 2015-09-25 MED ORDER — FENTANYL CITRATE (PF) 100 MCG/2ML IJ SOLN
INTRAMUSCULAR | Status: AC
  Filled 2015-09-25: qty 2

## 2015-09-25 MED ORDER — PROPOFOL 10 MG/ML IV BOLUS
INTRAVENOUS | Status: DC | PRN
  Administered 2015-09-25: 200 mg via INTRAVENOUS
  Administered 2015-09-25: 30 mg via INTRAVENOUS

## 2015-09-25 MED ORDER — GABAPENTIN 300 MG PO CAPS
300.0000 mg | ORAL_CAPSULE | ORAL | Status: AC
  Administered 2015-09-25: 300 mg via ORAL

## 2015-09-25 MED ORDER — ACETAMINOPHEN 500 MG PO TABS
1000.0000 mg | ORAL_TABLET | ORAL | Status: AC
  Administered 2015-09-25: 1000 mg via ORAL

## 2015-09-25 MED ORDER — ONDANSETRON HCL 4 MG/2ML IJ SOLN
INTRAMUSCULAR | Status: DC | PRN
  Administered 2015-09-25: 8 mg via INTRAVENOUS

## 2015-09-25 MED ORDER — DEXAMETHASONE SODIUM PHOSPHATE 10 MG/ML IJ SOLN
INTRAMUSCULAR | Status: DC | PRN
  Administered 2015-09-25: 10 mg via INTRAVENOUS

## 2015-09-25 MED ORDER — LIDOCAINE 2% (20 MG/ML) 5 ML SYRINGE
INTRAMUSCULAR | Status: AC
  Filled 2015-09-25: qty 5

## 2015-09-25 MED ORDER — DEXAMETHASONE SODIUM PHOSPHATE 10 MG/ML IJ SOLN
INTRAMUSCULAR | Status: AC
  Filled 2015-09-25: qty 1

## 2015-09-25 MED ORDER — SODIUM CHLORIDE 0.9 % IJ SOLN
INTRAVENOUS | Status: DC | PRN
  Administered 2015-09-25: 5 mL via INTRAMUSCULAR

## 2015-09-25 MED ORDER — SCOPOLAMINE 1 MG/3DAYS TD PT72: 1.0000 | MEDICATED_PATCH | Freq: Once | TRANSDERMAL | Status: DC | PRN

## 2015-09-25 MED ORDER — CHLORHEXIDINE GLUCONATE CLOTH 2 % EX PADS: 6.0000 | MEDICATED_PAD | Freq: Once | CUTANEOUS | Status: DC

## 2015-09-25 MED ORDER — HEPARIN SOD (PORK) LOCK FLUSH 100 UNIT/ML IV SOLN
INTRAVENOUS | Status: DC | PRN
  Administered 2015-09-25: 500 [IU] via INTRAVENOUS

## 2015-09-25 MED ORDER — OXYCODONE-ACETAMINOPHEN 5-325 MG PO TABS: 1.0000 | ORAL_TABLET | ORAL | 0 refills | Status: DC | PRN

## 2015-09-25 MED ORDER — GLYCOPYRROLATE 0.2 MG/ML IJ SOLN: 0.2000 mg | Freq: Once | INTRAMUSCULAR | Status: DC | PRN

## 2015-09-25 MED ORDER — FENTANYL CITRATE (PF) 100 MCG/2ML IJ SOLN
INTRAMUSCULAR | Status: DC | PRN
  Administered 2015-09-25 (×2): 50 ug via INTRAVENOUS

## 2015-09-25 MED ORDER — HYDROMORPHONE HCL 1 MG/ML IJ SOLN
INTRAMUSCULAR | Status: AC
  Filled 2015-09-25: qty 1

## 2015-09-25 MED ORDER — MIDAZOLAM HCL 2 MG/2ML IJ SOLN
INTRAMUSCULAR | Status: AC
  Filled 2015-09-25: qty 2

## 2015-09-25 MED ORDER — CHLORHEXIDINE GLUCONATE CLOTH 2 % EX PADS
6.0000 | MEDICATED_PAD | Freq: Once | CUTANEOUS | Status: DC
Start: 2015-09-25 — End: 2015-09-25

## 2015-09-25 MED ORDER — BUPIVACAINE-EPINEPHRINE (PF) 0.5% -1:200000 IJ SOLN
INTRAMUSCULAR | Status: DC | PRN
  Administered 2015-09-25: 30 mL

## 2015-09-25 MED ORDER — LIDOCAINE HCL (CARDIAC) 20 MG/ML IV SOLN
INTRAVENOUS | Status: DC | PRN
  Administered 2015-09-25: 60 mg via INTRAVENOUS

## 2015-09-25 MED ORDER — GABAPENTIN 300 MG PO CAPS
ORAL_CAPSULE | ORAL | Status: AC
  Filled 2015-09-25: qty 1

## 2015-09-25 MED ORDER — BUPIVACAINE-EPINEPHRINE 0.25% -1:200000 IJ SOLN
INTRAMUSCULAR | Status: DC | PRN
  Administered 2015-09-25: 27 mL

## 2015-09-25 MED ORDER — ONDANSETRON HCL 4 MG/2ML IJ SOLN
INTRAMUSCULAR | Status: AC
  Filled 2015-09-25: qty 2

## 2015-09-25 MED ORDER — FENTANYL CITRATE (PF) 100 MCG/2ML IJ SOLN
50.0000 ug | INTRAMUSCULAR | Status: DC | PRN
  Administered 2015-09-25 (×2): 50 ug via INTRAVENOUS

## 2015-09-25 MED ORDER — ACETAMINOPHEN 500 MG PO TABS
ORAL_TABLET | ORAL | Status: AC
  Filled 2015-09-25: qty 2

## 2015-09-25 MED ORDER — PROPOFOL 10 MG/ML IV BOLUS
INTRAVENOUS | Status: AC
  Filled 2015-09-25: qty 20

## 2015-09-25 MED ORDER — HYDROMORPHONE HCL 1 MG/ML IJ SOLN
0.2500 mg | INTRAMUSCULAR | Status: DC | PRN
  Administered 2015-09-25: 0.5 mg via INTRAVENOUS

## 2015-09-25 MED ORDER — MIDAZOLAM HCL 5 MG/5ML IJ SOLN
INTRAMUSCULAR | Status: DC | PRN
  Administered 2015-09-25: 2 mg via INTRAVENOUS

## 2015-09-25 MED ORDER — HEPARIN (PORCINE) IN NACL 2-0.9 UNIT/ML-% IJ SOLN
INTRAMUSCULAR | Status: DC | PRN
  Administered 2015-09-25: 1

## 2015-09-25 MED ORDER — LACTATED RINGERS IV SOLN
INTRAVENOUS | Status: DC
  Administered 2015-09-25 (×2): via INTRAVENOUS

## 2015-09-25 MED ORDER — CEFAZOLIN SODIUM-DEXTROSE 2-4 GM/100ML-% IV SOLN
INTRAVENOUS | Status: AC
  Filled 2015-09-25: qty 100

## 2015-09-25 SURGICAL SUPPLY — 74 items
APPLIER CLIP 9.375 MED OPEN (MISCELLANEOUS) ×4
BAG DECANTER FOR FLEXI CONT (MISCELLANEOUS) ×4 IMPLANT
BENZOIN TINCTURE PRP APPL 2/3 (GAUZE/BANDAGES/DRESSINGS) IMPLANT
BINDER BREAST LRG (GAUZE/BANDAGES/DRESSINGS) IMPLANT
BINDER BREAST MEDIUM (GAUZE/BANDAGES/DRESSINGS) IMPLANT
BINDER BREAST XLRG (GAUZE/BANDAGES/DRESSINGS) IMPLANT
BINDER BREAST XXLRG (GAUZE/BANDAGES/DRESSINGS) IMPLANT
BLADE CLIPPER SURG (BLADE) IMPLANT
BLADE HEX COATED 2.75 (ELECTRODE) ×4 IMPLANT
BLADE SURG 11 STRL SS (BLADE) ×4 IMPLANT
BLADE SURG 15 STRL LF DISP TIS (BLADE) ×4 IMPLANT
BLADE SURG 15 STRL SS (BLADE) ×4
CANISTER SUCT 1200ML W/VALVE (MISCELLANEOUS) ×4 IMPLANT
CHLORAPREP W/TINT 26ML (MISCELLANEOUS) ×4 IMPLANT
CLIP APPLIE 9.375 MED OPEN (MISCELLANEOUS) ×2 IMPLANT
CLOSURE WOUND 1/2 X4 (GAUZE/BANDAGES/DRESSINGS)
COVER BACK TABLE 60X90IN (DRAPES) ×4 IMPLANT
COVER MAYO STAND STRL (DRAPES) ×4 IMPLANT
COVER PROBE 5X48 (MISCELLANEOUS) ×2
COVER PROBE W GEL 5X96 (DRAPES) ×4 IMPLANT
DECANTER SPIKE VIAL GLASS SM (MISCELLANEOUS) IMPLANT
DEVICE DUBIN W/COMP PLATE 8390 (MISCELLANEOUS) ×4 IMPLANT
DRAPE C-ARM 42X72 X-RAY (DRAPES) ×4 IMPLANT
DRAPE LAPAROSCOPIC ABDOMINAL (DRAPES) ×4 IMPLANT
DRAPE UTILITY XL STRL (DRAPES) ×4 IMPLANT
DRSG TEGADERM 2-3/8X2-3/4 SM (GAUZE/BANDAGES/DRESSINGS) IMPLANT
ELECT COATED BLADE 2.86 ST (ELECTRODE) ×4 IMPLANT
ELECT REM PT RETURN 9FT ADLT (ELECTROSURGICAL) ×4
ELECTRODE REM PT RTRN 9FT ADLT (ELECTROSURGICAL) ×2 IMPLANT
GEL ULTRASOUND 8.5O AQUASONIC (MISCELLANEOUS) ×4 IMPLANT
GLOVE BIOGEL PI IND STRL 8 (GLOVE) ×2 IMPLANT
GLOVE BIOGEL PI INDICATOR 8 (GLOVE) ×2
GLOVE ECLIPSE 8.0 STRL XLNG CF (GLOVE) ×4 IMPLANT
GOWN STRL REUS W/ TWL LRG LVL3 (GOWN DISPOSABLE) ×4 IMPLANT
GOWN STRL REUS W/TWL LRG LVL3 (GOWN DISPOSABLE) ×4
HEMOSTAT SNOW SURGICEL 2X4 (HEMOSTASIS) ×4 IMPLANT
HEMOSTAT SURGICEL 2X14 (HEMOSTASIS) IMPLANT
IV KIT MINILOC 20X1 SAFETY (NEEDLE) IMPLANT
KIT CVR 48X5XPRB PLUP LF (MISCELLANEOUS) ×2 IMPLANT
KIT MARKER MARGIN INK (KITS) ×4 IMPLANT
KIT PORT POWER 8FR ISP CVUE (Catheter) ×4 IMPLANT
LIQUID BAND (GAUZE/BANDAGES/DRESSINGS) ×8 IMPLANT
NDL SAFETY ECLIPSE 18X1.5 (NEEDLE) ×2 IMPLANT
NEEDLE HYPO 18GX1.5 SHARP (NEEDLE) ×2
NEEDLE HYPO 22GX1.5 SAFETY (NEEDLE) IMPLANT
NEEDLE HYPO 25X1 1.5 SAFETY (NEEDLE) ×8 IMPLANT
NEEDLE SPNL 22GX3.5 QUINCKE BK (NEEDLE) IMPLANT
NS IRRIG 1000ML POUR BTL (IV SOLUTION) ×4 IMPLANT
PACK BASIN DAY SURGERY FS (CUSTOM PROCEDURE TRAY) ×4 IMPLANT
PENCIL BUTTON HOLSTER BLD 10FT (ELECTRODE) ×4 IMPLANT
SET SHEATH INTRODUCER 10FR (MISCELLANEOUS) IMPLANT
SHEATH COOK PEEL AWAY SET 9F (SHEATH) IMPLANT
SLEEVE SCD COMPRESS KNEE MED (MISCELLANEOUS) ×4 IMPLANT
SPONGE GAUZE 4X4 12PLY STER LF (GAUZE/BANDAGES/DRESSINGS) IMPLANT
SPONGE LAP 18X18 X RAY DECT (DISPOSABLE) IMPLANT
SPONGE LAP 4X18 X RAY DECT (DISPOSABLE) ×8 IMPLANT
STAPLER VISISTAT 35W (STAPLE) IMPLANT
STRIP CLOSURE SKIN 1/2X4 (GAUZE/BANDAGES/DRESSINGS) IMPLANT
SUT MNCRL AB 3-0 PS2 18 (SUTURE) ×8 IMPLANT
SUT MON AB 4-0 PC3 18 (SUTURE) ×4 IMPLANT
SUT PROLENE 2 0 CT2 30 (SUTURE) IMPLANT
SUT PROLENE 2 0 SH DA (SUTURE) ×4 IMPLANT
SUT SILK 2 0 SH (SUTURE) IMPLANT
SUT SILK 2 0 TIES 17X18 (SUTURE)
SUT SILK 2-0 18XBRD TIE BLK (SUTURE) IMPLANT
SUT VICRYL 3-0 CR8 SH (SUTURE) ×4 IMPLANT
SYR 5ML LUER SLIP (SYRINGE) ×4 IMPLANT
SYR BULB 3OZ (MISCELLANEOUS) ×4 IMPLANT
SYR CONTROL 10ML LL (SYRINGE) ×8 IMPLANT
TOWEL OR 17X24 6PK STRL BLUE (TOWEL DISPOSABLE) ×8 IMPLANT
TOWEL OR NON WOVEN STRL DISP B (DISPOSABLE) ×4 IMPLANT
TUBE CONNECTING 20'X1/4 (TUBING) ×1
TUBE CONNECTING 20X1/4 (TUBING) ×3 IMPLANT
YANKAUER SUCT BULB TIP NO VENT (SUCTIONS) ×4 IMPLANT

## 2015-09-25 NOTE — H&P (View-Only) (Signed)
Rennie Natter 09/17/2015 7:29 AM Location: [facility] Patient #: [id number] DOB: [birth year] Undefined / Language: Cleophus Molt / Race: White Female  History of Present Illness Marcello Moores A. Casmere Hollenbeck MD; 09/17/2015 10:11 AM) Patient words: Breast cancer left  Pt sent at the request of Dr Sondra Come for left breast cancer diagnosed by mammogram and U/S. Pt denies hx of breast mass, pain or discharge. No hx of breast cancer in her family. Mammogram showed 2 nodules 2 cm apart left UOQ IDC ER PR + HER 2 NEU +. Ech is about 8 mm.  The patient is a 55 year old female.   Other Problems Tawni Pummel, RN; 09/17/2015 7:29 AM) Anxiety Disorder Arthritis Asthma Back Pain Breast Cancer Cholelithiasis Depression Gastroesophageal Reflux Disease General anesthesia - complications Lump In Breast Thyroid Disease  Past Surgical History Tawni Pummel, RN; 09/17/2015 7:29 AM) Breast Biopsy Left. Colon Polyp Removal - Colonoscopy Gallbladder Surgery - Laparoscopic Sentinel Lymph Node Biopsy Spinal Surgery - Neck  Diagnostic Studies History Tawni Pummel, RN; 09/17/2015 7:29 AM) Colonoscopy 1-5 years ago Mammogram within last year Pap Smear 1-5 years ago  Medication History Tawni Pummel, RN; 09/17/2015 7:29 AM) Medications Reconciled  Social History Tawni Pummel, RN; 09/17/2015 7:29 AM) Alcohol use Occasional alcohol use. Caffeine use Coffee, Tea. No drug use Tobacco use Former smoker.  Family History Tawni Pummel, RN; 09/17/2015 7:29 AM) Alcohol Abuse Family Members In General. Arthritis Brother, Sister. Cancer Family Members In General. Hypertension Family Members In General, Mother. Kidney Disease Mother. Migraine Headache Mother. Thyroid problems Family Members In General.  Pregnancy / Birth History Tawni Pummel, RN; 09/17/2015 7:29 AM) Age at menarche 30 years. Age of menopause 89-50 Contraceptive History Oral  contraceptives. Gravida 2 Irregular periods Length (months) of breastfeeding 3-6 Maternal age 35-25 Para 2     Review of Systems Tawni Pummel RN; 09/17/2015 7:29 AM) General Present- Appetite Loss, Night Sweats and Weight Gain. Not Present- Chills, Fatigue, Fever and Weight Loss. Skin Present- Dryness. Not Present- Change in Wart/Mole, Hives, Jaundice, New Lesions, Non-Healing Wounds, Rash and Ulcer. HEENT Present- Earache, Sinus Pain, Sore Throat and Wears glasses/contact lenses. Not Present- Hearing Loss, Hoarseness, Nose Bleed, Oral Ulcers, Ringing in the Ears, Seasonal Allergies, Visual Disturbances and Yellow Eyes. Respiratory Present- Snoring. Not Present- Bloody sputum, Chronic Cough, Difficulty Breathing and Wheezing. Breast Present- Breast Pain. Not Present- Breast Mass, Nipple Discharge and Skin Changes. Cardiovascular Present- Difficulty Breathing Lying Down, Palpitations and Rapid Heart Rate. Not Present- Chest Pain, Leg Cramps, Shortness of Breath and Swelling of Extremities. Gastrointestinal Present- Abdominal Pain, Bloating, Chronic diarrhea, Constipation, Excessive gas, Indigestion and Nausea. Not Present- Bloody Stool, Change in Bowel Habits, Difficulty Swallowing, Gets full quickly at meals, Hemorrhoids, Rectal Pain and Vomiting. Female Genitourinary Not Present- Frequency, Nocturia, Painful Urination, Pelvic Pain and Urgency. Musculoskeletal Present- Joint Pain and Muscle Weakness. Not Present- Back Pain, Joint Stiffness, Muscle Pain and Swelling of Extremities. Neurological Not Present- Decreased Memory, Fainting, Headaches, Numbness, Seizures, Tingling, Tremor, Trouble walking and Weakness. Psychiatric Present- Anxiety, Change in Sleep Pattern, Depression, Fearful and Frequent crying. Not Present- Bipolar. Endocrine Present- Cold Intolerance, Hair Changes, Heat Intolerance and Hot flashes. Not Present- Excessive Hunger and New Diabetes. Hematology Not Present- Blood  Thinners, Easy Bruising, Excessive bleeding, Gland problems, HIV and Persistent Infections.   Physical Exam (Spero Gunnels A. Aliayah Tyer MD; 09/17/2015 10:12 AM)  General Mental Status-Alert. General Appearance-Consistent with stated age. Hydration-Well hydrated. Voice-Normal.  Head and Neck Head-normocephalic, atraumatic with no lesions or palpable masses. Trachea-midline. Thyroid  Gland Characteristics - normal size and consistency. Note: scar noted  Chest and Lung Exam Chest and lung exam reveals -quiet, even and easy respiratory effort with no use of accessory muscles and on auscultation, normal breath sounds, no adventitious sounds and normal vocal resonance. Inspection Chest Wall - Normal. Back - normal.  Breast Breast - Left-Symmetric, Non Tender, No Biopsy scars, no Dimpling, No Inflammation, No Lumpectomy scars, No Mastectomy scars, No Peau d' Orange. Breast - Right-Symmetric, Non Tender, No Biopsy scars, no Dimpling, No Inflammation, No Lumpectomy scars, No Mastectomy scars, No Peau d' Orange. Breast Lump-No Palpable Breast Mass.  Cardiovascular Cardiovascular examination reveals -normal heart sounds, regular rate and rhythm with no murmurs and normal pedal pulses bilaterally.  Neurologic Neurologic evaluation reveals -alert and oriented x 3 with no impairment of recent or remote memory. Mental Status-Normal.  Musculoskeletal Normal Exam - Left-Upper Extremity Strength Normal and Lower Extremity Strength Normal. Normal Exam - Right-Upper Extremity Strength Normal and Lower Extremity Strength Normal.  Lymphatic Head & Neck  General Head & Neck Lymphatics: Bilateral - Description - Normal. Axillary  General Axillary Region: Bilateral - Description - Normal. Tenderness - Non Tender.    Assessment & Plan (Porche Steinberger A. Jordy Verba MD; 09/17/2015 10:18 AM)  BREAST CANCER, LEFT (C50.912) Impression: multifocal   discussed mastectomy vs lumpectomy.  Discussed SLN mapping and port placement. Risk of lumpectomy include bleeding, infection, seroma, more surgery, use of seed/wire, wound care, cosmetic deformity and the need for other treatments, death , blood clots, death. Pt agrees to proceed. Risk of sentinel lymph node mapping include bleeding, infection, lymphedema, shoulder pain. stiffness, dye allergy. cosmetic deformity , blood clots, death, need for more surgery. Pt agres to proceed. Pt requires port placement for chemotherapy. Risk include bleeding, infection, pneumothorax, hemothorax, mediastinal injury, nerve injury , blood vessel injury, strke, blood clots, death, migration. embolization and need for additional procedures. Pt agrees to proceed.  Current Plans You are being scheduled for surgery - Our schedulers will call you.  You should hear from our office's scheduling department within 5 working days about the location, date, and time of surgery. We try to make accommodations for patient's preferences in scheduling surgery, but sometimes the OR schedule or the surgeon's schedule prevents [place] from making those accommodations.  If you have not heard from our office [phone]) in 5 working days, call the office and ask for your surgeon's nurse.  If you have other questions about your diagnosis, plan, or surgery, call the office and ask for your surgeon's nurse.  Pt Education - [facility] Breast Cancer Information Given - [facility] "Breast Journey" Package We discussed the staging and pathophysiology of breast cancer. We discussed all of the different options for treatment for breast cancer including surgery, chemotherapy, radiation therapy, Herceptin, and antiestrogen therapy. We discussed a sentinel lymph node biopsy as she does not appear to having lymph node involvement right now. We discussed the performance of that with injection of radioactive tracer and blue dye. We discussed that she would have an incision underneath her axillary hairline.  We discussed that there is a bout a 10-20% chance of having a positive node with a sentinel lymph node biopsy and we will await the permanent pathology to make any other first further decisions in terms of her treatment. One of these options might be to return to the operating room to perform an axillary lymph node dissection. We discussed about a 1-2% risk lifetime of chronic shoulder pain as well as lymphedema associated with  a sentinel lymph node biopsy. We discussed the options for treatment of the breast cancer which included lumpectomy versus a mastectomy. We discussed the performance of the lumpectomy with a wire placement. We discussed a 10-20% chance of a positive margin requiring reexcision in the operating room. We also discussed that she may need radiation therapy or antiestrogen therapy or both if she undergoes lumpectomy. We discussed the mastectomy and the postoperative care for that as well. We discussed that there is no difference in her survival whether she undergoes lumpectomy with radiation therapy or antiestrogen therapy versus a mastectomy. There is a slight difference in the local recurrence rate being 3-5% with lumpectomy and about 1% with a mastectomy. We discussed the risks of operation including bleeding, infection, possible reoperation. She understands her further therapy will be based on what her stages at the time of her operation.  Pt Education - [facility] breast cancer surgery: discussed with patient and provided information. Pt Education - [facility] Breast Biopsy HCI: discussed with patient and provided information. Pt Education - ABC (After Breast Cancer) Class Info: discussed with patient and provided information.

## 2015-09-25 NOTE — Interval H&P Note (Signed)
History and Physical Interval Note:  09/25/2015 12:15 PM  Jenna [name]  has presented today for surgery, with the diagnosis of LEFT BREAST CANCER  The various methods of treatment have been discussed with the patient and family. After consideration of risks, benefits and other options for treatment, the patient has consented to  Procedure(s): BREAST LUMPECTOMY WITH NEEDLE LOCALIZATION X'S 2 AND AXILLARY SENTINEL LYMPH NODE BX (Left) INSERTION PORT-A-CATH (N/A) as a surgical intervention .  The patient's history has been reviewed, patient examined, no change in status, stable for surgery.  I have reviewed the patient's chart and labs.  Questions were answered to the patient's satisfaction.     Shalanda Brogden A.

## 2015-09-25 NOTE — Anesthesia Procedure Notes (Addendum)
Anesthesia Regional Block:  Pectoralis block  Pre-Anesthetic Checklist: ,, timeout performed, Correct Patient, Correct Site, Correct Laterality, Correct Procedure, Correct Position, site marked, Risks and benefits discussed, pre-op evaluation,  At surgeon's request and post-op pain management  Laterality: Left  Prep: Maximum Sterile Barrier Precautions used, chloraprep       Needles:  Injection technique: Single-shot  Needle Type: Echogenic Stimulator Needle     Needle Length: 9cm 9 cm Needle Gauge: 21 and 21 G    Additional Needles:  Procedures: ultrasound guided (picture in chart) Pectoralis block Narrative:  Start time: 09/25/2015 11:50 AM End time: 09/25/2015 12:00 PM Injection made incrementally with aspirations every 5 mL. Anesthesiologist: Roderic Palau  Additional Notes: 2% Lidocaine skin wheel.

## 2015-09-25 NOTE — Anesthesia Postprocedure Evaluation (Signed)
Anesthesia Post Note  Patient: Lainy Staus  Procedure(s) Performed: Procedure(s) (LRB): BREAST LUMPECTOMY WITH NEEDLE LOCALIZATION X'S 2 AND AXILLARY SENTINEL LYMPH NODE BX (Left) INSERTION PORT-A-CATH (Right)  Patient location during evaluation: PACU Anesthesia Type: General and Regional Level of consciousness: awake and alert Pain management: pain level controlled Vital Signs Assessment: post-procedure vital signs reviewed and stable Respiratory status: spontaneous breathing, nonlabored ventilation and respiratory function stable Cardiovascular status: blood pressure returned to baseline and stable Postop Assessment: no signs of nausea or vomiting Anesthetic complications: no    Last Vitals:  Vitals:   09/25/15 1515 09/25/15 1530  BP: 133/80 (!) 145/91  Pulse: 82 90  Resp: 14 16  Temp:  36.9 C    Last Pain:  Vitals:   09/25/15 1530  TempSrc:   PainSc: 1                  Aquiles Ruffini,W. EDMOND

## 2015-09-25 NOTE — Discharge Instructions (Signed)
[facility] °Office Phone Number [phone] ° °BREAST BIOPSY/ PARTIAL MASTECTOMY: POST OP INSTRUCTIONS ° °Always review your discharge instruction sheet given to you by the facility where your surgery was performed. ° °IF YOU HAVE DISABILITY OR FAMILY LEAVE FORMS, YOU MUST BRING THEM TO THE OFFICE FOR PROCESSING.  DO NOT GIVE THEM TO YOUR DOCTOR. ° °1. A prescription for pain medication may be given to you upon discharge.  Take your pain medication as prescribed, if needed.  If narcotic pain medicine is not needed, then you may take acetaminophen (Tylenol) or ibuprofen (Advil) as needed. °2. Take your usually prescribed medications unless otherwise directed °3. If you need a refill on your pain medication, please contact your pharmacy.  They will contact our office to request authorization.  Prescriptions will not be filled after 5pm or on week-ends. °4. You should eat very light the first 24 hours after surgery, such as soup, crackers, pudding, etc.  Resume your normal diet the day after surgery. °5. Most patients will experience some swelling and bruising in the breast.  Ice packs and a good support bra will help.  Swelling and bruising can take several days to resolve.  °6. It is common to experience some constipation if taking pain medication after surgery.  Increasing fluid intake and taking a stool softener will usually help or prevent this problem from occurring.  A mild laxative (Milk of Magnesia or Miralax) should be taken according to package directions if there are no bowel movements after 48 hours. °7. Unless discharge instructions indicate otherwise, you may remove your bandages 24-48 hours after surgery, and you may shower at that time.  You may have steri-strips (small skin tapes) in place directly over the incision.  These strips should be left on the skin for 7-10 days.  If your surgeon used skin glue on the incision, you may shower in 24 hours.  The glue will flake off over the  next 2-3 weeks.  Any sutures or staples will be removed at the office during your follow-up visit. °8. ACTIVITIES:  You may resume regular daily activities (gradually increasing) beginning the next day.  Wearing a good support bra or sports bra minimizes pain and swelling.  You may have sexual intercourse when it is comfortable. °a. You may drive when you no longer are taking prescription pain medication, you can comfortably wear a seatbelt, and you can safely maneuver your car and apply brakes. °b. RETURN TO WORK:  ______________________________________________________________________________________ °9. You should see your doctor in the office for a follow-up appointment approximately two weeks after your surgery.  Your doctor’s nurse will typically make your follow-up appointment when she calls you with your pathology report.  Expect your pathology report 2-3 business days after your surgery.  You may call to check if you do not hear from us after three days. °10. OTHER INSTRUCTIONS: _______________________________________________________________________________________________ _____________________________________________________________________________________________________________________________________ °_____________________________________________________________________________________________________________________________________ °_____________________________________________________________________________________________________________________________________ ° °WHEN TO CALL YOUR DOCTOR: °1. Fever over 101.0 °2. Nausea and/or vomiting. °3. Extreme swelling or bruising. °4. Continued bleeding from incision. °5. Increased pain, redness, or drainage from the incision. ° °The clinic staff is available to answer your questions during regular business hours.  Please don’t hesitate to call and ask to speak to one of the nurses for clinical concerns.  If you have a medical emergency, go to the nearest  emergency room or call 911.  A surgeon from [facility] is always on call at the hospital. ° °For further questions, please visit [email or web address]  ° ° ° °  PORT-A-CATH: POST OP INSTRUCTIONS ° °Always review your discharge instruction sheet given to you by the facility where your surgery was performed.  ° °1. A prescription for pain medication may be given to you upon discharge. Take your pain medication as prescribed, if needed. If narcotic pain medicine is not needed, then you make take acetaminophen (Tylenol) or ibuprofen (Advil) as needed.  °2. Take your usually prescribed medications unless otherwise directed. °3. If you need a refill on your pain medication, please contact our office. All narcotic pain medicine now requires a paper prescription.  Phoned in and fax refills are no longer allowed by law.  Prescriptions will not be filled after 5 pm or on weekends.  °4. You should follow a light diet for the remainder of the day after your procedure. °5. Most patients will experience some mild swelling and/or bruising in the area of the incision. It may take several days to resolve. °6. It is common to experience some constipation if taking pain medication after surgery. Increasing fluid intake and taking a stool softener (such as Colace) will usually help or prevent this problem from occurring. A mild laxative (Milk of Magnesia or Miralax) should be taken according to package directions if there are no bowel movements after 48 hours.  °7. Unless discharge instructions indicate otherwise, you may remove your bandages 48 hours after surgery, and you may shower at that time. You may have steri-strips (small white skin tapes) in place directly over the incision.  These strips should be left on the skin for 7-10 days.  If your surgeon used Dermabond (skin glue) on the incision, you may shower in 24 hours.  The glue will flake off over the next 2-3 weeks.  °8. If your port is left accessed at the end  of surgery (needle left in port), the dressing cannot get wet and should only by changed by a healthcare professional. When the port is no longer accessed (when the needle has been removed), follow step 7.   °9. ACTIVITIES:  Limit activity involving your arms for the next 72 hours. Do no strenuous exercise or activity for 1 week. You may drive when you are no longer taking prescription pain medication, you can comfortably wear a seatbelt, and you can maneuver your car. °10.You may need to see your doctor in the office for a follow-up appointment.  Please °      check with your doctor.  °11.When you receive a new Port-a-Cath, you will get a product guide and  °      ID card.  Please keep them in case you need them. ° °WHEN TO CALL YOUR DOCTOR ([phone]): °1. Fever over 101.0 °2. Chills °3. Continued bleeding from incision °4. Increased redness and tenderness at the site °5. Shortness of breath, difficulty breathing ° ° °The clinic staff is available to answer your questions during regular business hours. Please don’t hesitate to call and ask to speak to one of the nurses or medical assistants for clinical concerns. If you have a medical emergency, go to the nearest emergency room or call 911.  A [facility] from [facility] is always on call at the hospital.  ° ° ° °For further information, please visit [email or web address] ° ° ° ° °Post Anesthesia Home Care Instructions ° °Activity: °Get plenty of rest for the remainder of the day. A responsible adult should stay with you for 24 hours following the procedure.  °For the next 24 hours, DO NOT: °-Drive a car °-  machinery -Drink alcoholic beverages -Take any medication unless instructed by your physician -Make any legal decisions or sign important papers.  Meals: Start with liquid foods such as gelatin or soup. Progress to regular foods as tolerated. Avoid greasy, spicy, heavy foods. If nausea and/or vomiting occur, drink only clear  liquids until the nausea and/or vomiting subsides. Call your physician if vomiting continues.  Special Instructions/Symptoms: Your throat may feel dry or sore from the anesthesia or the breathing tube placed in your throat during surgery. If this causes discomfort, gargle with warm salt water. The discomfort should disappear within 24 hours.  If you had a scopolamine patch placed behind your ear for the management of post- operative nausea and/or vomiting:  1. The medication in the patch is effective for 72 hours, after which it should be removed.  Wrap patch in a tissue and discard in the trash. Wash hands thoroughly with soap and water. 2. You may remove the patch earlier than 72 hours if you experience unpleasant side effects which may include dry mouth, dizziness or visual disturbances. 3. Avoid touching the patch. Wash your hands with soap and water after contact with the patch.

## 2015-09-25 NOTE — Anesthesia Preprocedure Evaluation (Addendum)
Anesthesia Evaluation  Patient identified by MRN, date of birth, ID band Patient awake    Reviewed: Allergy & Precautions, H&P , NPO status , Patient's Chart, lab work & pertinent test results  Airway Mallampati: II  TM Distance: >3 FB Neck ROM: Full    Dental no notable dental hx. (+) Teeth Intact, Dental Advisory Given   Pulmonary asthma , former smoker,    Pulmonary exam normal breath sounds clear to auscultation       Cardiovascular negative cardio ROS   Rhythm:Regular Rate:Normal     Neuro/Psych Anxiety negative neurological ROS     GI/Hepatic Neg liver ROS, GERD  Medicated and Controlled,  Endo/Other  Hypothyroidism   Renal/GU negative Renal ROS  negative genitourinary   Musculoskeletal   Abdominal   Peds  Hematology negative hematology ROS (+)   Anesthesia Other Findings   Reproductive/Obstetrics negative OB ROS                            Anesthesia Physical Anesthesia Plan  ASA: II  Anesthesia Plan: General and Regional   Post-op Pain Management: GA combined w/ Regional for post-op pain   Induction: Intravenous  Airway Management Planned: LMA  Additional Equipment:   Intra-op Plan:   Post-operative Plan: Extubation in OR  Informed Consent: I have reviewed the patients History and Physical, chart, labs and discussed the procedure including the risks, benefits and alternatives for the proposed anesthesia with the patient or authorized representative who has indicated his/her understanding and acceptance.   Dental advisory given  Plan Discussed with: CRNA  Anesthesia Plan Comments:         Anesthesia Quick Evaluation

## 2015-09-25 NOTE — Anesthesia Procedure Notes (Signed)
Procedure Name: LMA Insertion Date/Time: 09/25/2015 1:03 PM Performed by: Justice Rocher Pre-anesthesia Checklist: Patient identified, Emergency Drugs available, Suction available and Patient being monitored Patient Re-evaluated:Patient Re-evaluated prior to inductionOxygen Delivery Method: Circle system utilized Preoxygenation: Pre-oxygenation with 100% oxygen Intubation Type: IV induction Ventilation: Mask ventilation without difficulty LMA: LMA inserted LMA Size: 4.0 Number of attempts: 1 Airway Equipment and Method: Bite block Placement Confirmation: positive ETCO2 Tube secured with: Tape Dental Injury: Teeth and Oropharynx as per pre-operative assessment

## 2015-09-25 NOTE — Progress Notes (Signed)
Pt given a 8oz carton of boost breeze to drink now, voiced understanding this is the only liquid she can have till after surgery

## 2015-09-25 NOTE — Transfer of Care (Signed)
Immediate Anesthesia Transfer of Care Note  Patient: Jenna [name]  Procedure(s) Performed: Procedure(s) (LRB): BREAST LUMPECTOMY WITH NEEDLE LOCALIZATION X'S 2 AND AXILLARY SENTINEL LYMPH NODE BX (Left) INSERTION PORT-A-CATH (Right)  Patient Location: PACU  Anesthesia Type: General  Level of Consciousness: awake, sedated, patient cooperative and responds to stimulation  Airway & Oxygen Therapy: Patient Spontanous Breathing and Patient connected to face mask oxygen  Post-op Assessment: Report given to PACU RN, Post -op Vital signs reviewed and stable and Patient moving all extremities  Post vital signs: Reviewed and stable  Complications: No apparent anesthesia complications

## 2015-09-25 NOTE — Brief Op Note (Signed)
09/25/2015  2:28 PM  PATIENT:  Jenna [name]  55 y.o. female  PRE-OPERATIVE DIAGNOSIS:  LEFT BREAST CANCER  POST-OPERATIVE DIAGNOSIS:  LEFT BREAST CANCER  PROCEDURE:  Procedure(s): BREAST LUMPECTOMY WITH NEEDLE LOCALIZATION X'S 2 AND AXILLARY SENTINEL LYMPH NODE BX (Left) INSERTION PORT-A-CATH (Right)  SURGEON:  Surgeon(s) and Role:    * Erroll Luna, MD - Primary ASSISTANTS: none   ANESTHESIA:   local, regional and general  EBL:  Total I/O In: 1000 [I.V.:1000] Out: -   BLOOD ADMINISTERED:none  DRAINS: none   LOCAL MEDICATIONS USED:  BUPIVICAINE   SPECIMEN:  Source of Specimen:  left breast and axilla   DISPOSITION OF SPECIMEN:  PATHOLOGY  COUNTS:  YES  TOURNIQUET:  * No tourniquets in log *  DICTATION: .Other Dictation: Dictation Number  [id number]  PLAN OF CARE: Discharge to home after PACU  PATIENT DISPOSITION:  PACU - hemodynamically stable.   Delay start of Pharmacological VTE agent (>24hrs) due to surgical blood loss or risk of bleeding: not applicable

## 2015-09-25 NOTE — Progress Notes (Signed)
Assisted Dr. Oren Bracket with left, ultrasound guided, pectoralis block. Side rails up, monitors on throughout procedure. See vital signs in flow sheet. Tolerated Procedure well.

## 2015-09-26 ENCOUNTER — Encounter (HOSPITAL_BASED_OUTPATIENT_CLINIC_OR_DEPARTMENT_OTHER): Payer: Self-pay | Admitting: Surgery

## 2015-09-26 DIAGNOSIS — C50912 Malignant neoplasm of unspecified site of left female breast: Secondary | ICD-10-CM | POA: Diagnosis not present

## 2015-09-26 NOTE — Op Note (Signed)
Jenna [name], [name]                ACCOUNT NO.:  [id number]  MEDICAL RECORD NO.:  [id number]  LOCATION:                               FACILITY:  [facility]  PHYSICIAN:  Marcello Moores A. Chanell Nadeau, M.D.DATE OF BIRTH:  06-07-1960  DATE OF PROCEDURE:  09/25/2015 DATE OF DISCHARGE:                              OPERATIVE REPORT   PREOPERATIVE DIAGNOSIS:  Multifocal stage I left breast cancer, triple negative.  POSTOPERATIVE DIAGNOSIS:  Multifocal stage I left breast cancer, triple negative.  PROCEDURE: 1. Placement of right internal jugular 8-French Port-A-Cath with     ultrasonic and C-arm guidance. 2. Left breast needle localized lumpectomy x2. 3. Left axillary sentinel lymph node mapping with methylene blue dye.  SURGEON:  Erroll Luna, MD.  ANESTHESIA:  Pectoral block with LMA and 0.25% Sensorcaine local with epinephrine.  EBL:  100 mL.  SPECIMEN:  Left breast tissue with both wires and both clips verified by radiograph and both masses within the specimen.  Two left axillary sentinel node blue and hot.  DRAINS:  None.  INDICATIONS FOR PROCEDURE:  The patient is a 55 year old female with multifocal left breast cancer.  Both tumors were within 2 cm.  Of note, during biopsy and clip placement, the clips had migrated about 3 cm anterior to the masses.  She opted for breast conservation.  She is triple negative and will require postoperative chemotherapy as well.  We discussed Port-A-Cath placement.  We discussed wire localized left breast lumpectomy since she desired breast conservation.  We discussed left axillary sentinel lymph node mapping and the use of methylene blue dye and other dyes to map out her nodes.  Risk of all these procedures were discussed preoperatively as well as alternatives and other possible treatments.  Risk of bleeding, infection, seroma formation, cosmetic deformity, wound infection, the need for other surgery, collapsed lung, bleeding around the lung, nerve  injury, blood vessel injury, weakness of the extremity, blood clots, stroke, DVT, myocardial infarction, catheter migration, catheter occlusion, catheter infection, injury to the mediastinal structures, injury to the heart, and the need for other corrective procedures discussed.  Discussed positive margin reexcision. Discussed lymph node dissection if necessary. Discussed the role of radiation therapy and chemotherapy as well with the patient.  After this, she had a discussion with Medical and Radiation Oncology and agreed to proceed with breast conserving surgery.  DESCRIPTION OF PROCEDURE:  The patient was met in the holding area.  She underwent wire localization earlier today by Radiology.  She had technetium sulfur colloid injected under the patient's left nipple.  We discussed procedure as well as questions and long-term expectations. She was taken back to the operating room.  She was placed supine on the OR table.  After induction of general anesthesia, the right arm was tucked and the left arm was placed on arm board.  Of note, she underwent a pectoral block on the left side.  The upper chest and neck regions were then prepped and draped in sterile fashion.  Time-out was done. The right Port-A-Cath was placed first.  Ultrasound was used to locate the right internal jugular vein.  Under ultrasonic guidance, a needle was advanced into the vein.  We pulled back dark nonpulsatile blood.  A wire was fed through this down the vena cava.  C-arm was used for guidance of the wire down the superior vena cava into the right ventricle.  A small stab incision was made at the wire insertion site. Below the right clavicle, a 3 cm incision was made and dissection was carried down to the chest wall where a small pocket was created.  An 8- [place] Port-A-Cath was brought onto the field.  It was attached and then flushed.  It was tunneled from the lower incision to the upper incision. With the  patient in Trendelenburg position, the dilator introducer complex was placed over the wire and advanced moving the wire to and fro with no resistance.  Once the introducer was in place, I removed the dilator and wire.  The catheter was placed into the dilator peel-away sheath and this was peeled away without difficulty.  The catheter was cut to a length of approximately 20 cm.  C-arm was used and the tip appeared to be in the distal superior vena cava.  There was no obvious pneumothorax.  There was no kinking of the catheter.  The catheter was then interrogated by drawing back on the port hub and blood returned easily.  It was then flushed with heparinized saline.  The catheter itself was secured to the chest wall with 2-0 Prolene.  The incisions were closed with 3-0 Vicryl and 4-0 Monocryl.  Liquid adhesive applied.  The lumpectomy on the left was done.  She had 2 tumors however in the same quadrant which was at her left lower outer breast.  Wires were placed to bracket the lesions.  Of note, the biopsy clips the radiologist had placed had migrated almost 3 cm anterior to the actual cancers.  Per conversation with radiologist, wires were placed through the tumors not the clips.  This was done under ultrasound guidance.  I discussed this with the patient and her husband preoperatively.  A transverse incision was made in the lateral breast to try to hide the incision.  This also gave me the ability to excise the clips which were going to be anterior and more medial to the tumors itself.  Incision was made near the wires.  The wires brought the incision and the tissue around both wires and the area where the clips were located were excised in its entirety.  I then took a specimen radiograph which showed both clips at both wires in the specimen.  I could feel 2 tumors corresponding to where the wires were.  The margins look grossly negative.  I then irrigated out the cavity.  I clipped the  cavity for radiation therapy and then closed with 3-0 Vicryl and 4-0 Monocryl.  The left axilla was then evaluated.  4 mL of methylene blue dye were injected under the left nipple and massaged for 5 minutes.  Neoprobe was used and hotspot was identified in the left axilla.  Incision was made and dissection was carried through into the left axillary contents.  I identified 2 blue and hot sentinel nodes removed.  Small vessels and lymphatics were controlled with clips and cautery.  Hemostasis was achieved.  Background counts approached to 0.  Long thoracic nerve, thoracodorsal trunk, and axillary vein were all preserved.  Surgicel now was placed in the cavity after ensuring hemostasis and the wound was closed with 3-0 Vicryl and 4-0 Monocryl.  Liquid adhesive applied to all incisions.  All final counts were found  to be correct.  The patient was awoke, extubated, and taken to recovery in satisfactory condition.     Benigna Delisi A. Ladiamond Gallina, M.D.   ______________________________ Joyice Faster. Asa Baudoin, M.D.   TAC/MEDQ  D:  09/25/2015  T:  09/26/2015  Job:  [id number]

## 2015-09-26 NOTE — Addendum Note (Signed)
Addendum  created 09/26/15 0857 by Ernesta Amble Daphney Hopke, CRNA   Charge Capture section accepted

## 2015-09-26 NOTE — Op Note (Deleted)
  The note originally documented on this encounter has been moved the the encounter in which it belongs.  

## 2015-09-28 ENCOUNTER — Other Ambulatory Visit: Payer: Self-pay | Admitting: Physician Assistant

## 2015-09-29 ENCOUNTER — Other Ambulatory Visit: Payer: Self-pay | Admitting: *Deleted

## 2015-09-29 MED ORDER — LINACLOTIDE 290 MCG PO CAPS: 290.0000 ug | ORAL_CAPSULE | Freq: Every day | ORAL | 1 refills | Status: DC

## 2015-09-29 MED ORDER — DULOXETINE HCL 30 MG PO CPEP: 30.0000 mg | ORAL_CAPSULE | Freq: Every day | ORAL | 1 refills | Status: DC

## 2015-10-03 ENCOUNTER — Ambulatory Visit (HOSPITAL_BASED_OUTPATIENT_CLINIC_OR_DEPARTMENT_OTHER): Payer: BLUE CROSS/BLUE SHIELD | Admitting: Hematology and Oncology

## 2015-10-03 ENCOUNTER — Encounter: Payer: Self-pay | Admitting: Hematology and Oncology

## 2015-10-03 DIAGNOSIS — C50512 Malignant neoplasm of lower-outer quadrant of left female breast: Secondary | ICD-10-CM

## 2015-10-03 DIAGNOSIS — Z170 Estrogen receptor positive status [ER+]: Secondary | ICD-10-CM | POA: Diagnosis not present

## 2015-10-03 NOTE — Progress Notes (Signed)
Patient Care Team: Donella Stade, PA-C as PCP - General (Family Medicine) Erroll Luna, MD as Consulting Physician (General Surgery) Nicholas Lose, MD as Consulting Physician (Hematology and Oncology) Gery Pray, MD as Consulting Physician (Radiation Oncology)  DIAGNOSIS: Breast cancer of lower-outer quadrant of left female breast [facility])   Staging form: Breast, AJCC 7th Edition   - Clinical stage from 09/17/2015: Stage IA (T1b, N0, M0) - Unsigned         Staging comments: Staged at breast conference on 9.13.17  SUMMARY OF ONCOLOGIC HISTORY:   Breast cancer of lower-outer quadrant of left female breast ([place])   09/10/2015 Initial Diagnosis    Screening detected left breast asymmetry: 2 adjacent masses 4:00: 8 mm and 3:30: 9 mm; axilla negative, biopsy grade 2-3 IDC, ER 70%, PR 5%, Ki-67 10%, HER-2 positive ratio 3.41, T1BN0 stage IA      09/25/2015 Surgery    Left lumpectomy: IDC with DCIS, 1.2 cm, margins negative, 0/4 lymph nodes negative, grade 3, ER 70%, PR 5%, HER-2 positive ratio 3.45, Ki-67 10%, T1 CN 0 stage IA       CHIEF COMPLIANT: Follow-up after left lumpectomy to discuss chemotherapy  INTERVAL HISTORY: Elinore Shults is a 55 year old with above-mentioned history left breast cancer HER-2 positive disease who is here after having undergone lumpectomy. She is complaining of a lot of pain and discomfort. She reports shooting pains that go from the nipple 2 the back of the breast and numbness on the arm.  REVIEW OF SYSTEMS:   Constitutional: Denies fevers, chills or abnormal weight loss Eyes: Denies blurriness of vision Ears, nose, mouth, throat, and face: Denies mucositis or sore throat Respiratory: Denies cough, dyspnea or wheezes Cardiovascular: Denies palpitation, chest discomfort Gastrointestinal:  Denies nausea, heartburn or change in bowel habits Skin: Denies abnormal skin rashes Lymphatics: Denies new lymphadenopathy or easy bruising Neurological:Denies numbness,  tingling or new weaknesses Behavioral/Psych: Mood is stable, no new changes  Extremities: No lower extremity edema Breast: Recent lumpectomy All other systems were reviewed with the patient and are negative.  I have reviewed the past medical history, past surgical history, social history and family history with the patient and they are unchanged from previous note.  ALLERGIES:  is allergic to sulfa antibiotics.  MEDICATIONS:  Current Outpatient Prescriptions  Medication Sig Dispense Refill  . Albuterol Sulfate (PROAIR RESPICLICK) 979 (90 BASE) MCG/ACT AEPB Inhale 2 puffs into the lungs every 6 (six) hours. (Patient not taking: Reported on 09/19/2015) 3 each 3  . aspirin EC 81 MG tablet Take 81 mg by mouth daily.    . celecoxib (CELEBREX) 200 MG capsule Take 1 capsule (200 mg total) by mouth 2 (two) times daily. 180 capsule 2  . Cholecalciferol (VITAMIN D3) 2000 units capsule Take by mouth.    . DULoxetine (CYMBALTA) 30 MG capsule Take 1 capsule (30 mg total) by mouth daily. 90 capsule 1  . Lactobacillus (DIGESTIVE HEALTH PROBIOTIC PO) Take by mouth.    . levothyroxine (SYNTHROID, LEVOTHROID) 50 MCG tablet TAKE 1 TABLET (50 MCG TOTAL) BY MOUTH DAILY. 90 tablet 1  . linaclotide (LINZESS) 290 MCG CAPS capsule Take 1 capsule (290 mcg total) by mouth daily. 90 capsule 1  . liothyronine (CYTOMEL) 25 MCG tablet Take 1 tablet (25 mcg total) by mouth daily. 90 tablet 0  . Multiple Vitamins-Minerals (WOMENS MULTIVITAMIN PLUS PO) Take by mouth daily.    . Omega-3 Fatty Acids (SALMON OIL PO) Take 400 mg by mouth daily.    Marland Kitchen  omeprazole (PRILOSEC) 20 MG capsule Take 1 capsule (20 mg total) by mouth 2 (two) times daily before a meal. 180 capsule 4  . oxyCODONE-acetaminophen (ROXICET) 5-325 MG tablet Take 1-2 tablets by mouth every 4 (four) hours as needed. 30 tablet 0  . Oxymetazoline HCl (NASAL SPRAY NA) Place into the nose.    . Polyethylene Glycol 3350 (MIRALAX PO) Take by mouth.    . Simethicone  (GAS-X PO) Take by mouth.    . triamcinolone (NASACORT) 55 MCG/ACT AERO nasal inhaler Place into the nose.     No current facility-administered medications for this visit.     PHYSICAL EXAMINATION: ECOG PERFORMANCE STATUS: 1 - Symptomatic but completely ambulatory  Vitals:   10/03/15 0923  BP: 127/79  Pulse: 73  Resp: 18  Temp: 98.1 F (36.7 C)   Filed Weights   10/03/15 0923  Weight: 163 lb 14.4 oz (74.3 kg)    GENERAL:alert, no distress and comfortable SKIN: skin color, texture, turgor are normal, no rashes or significant lesions EYES: normal, Conjunctiva are pink and non-injected, sclera clear OROPHARYNX:no exudate, no erythema and lips, buccal mucosa, and tongue normal  NECK: supple, thyroid normal size, non-tender, without nodularity LYMPH:  no palpable lymphadenopathy in the cervical, axillary or inguinal LUNGS: clear to auscultation and percussion with normal breathing effort HEART: regular rate & rhythm and no murmurs and no lower extremity edema ABDOMEN:abdomen soft, non-tender and normal bowel sounds MUSCULOSKELETAL:no cyanosis of digits and no clubbing  NEURO: alert & oriented x 3 with fluent speech, no focal motor/sensory deficits EXTREMITIES: No lower extremity edema  LABORATORY DATA:  I have reviewed the data as listed   Chemistry      Component Value Date/Time   NA 142 09/17/2015 0747   K 4.2 09/17/2015 0747   CL 102 08/12/2015 0959   CO2 28 09/17/2015 0747   BUN 16.0 09/17/2015 0747   CREATININE 0.7 09/17/2015 0747      Component Value Date/Time   CALCIUM 9.3 09/17/2015 0747   ALKPHOS 58 09/17/2015 0747   AST 17 09/17/2015 0747   ALT 17 09/17/2015 0747   BILITOT 0.54 09/17/2015 0747       Lab Results  Component Value Date   WBC 6.0 09/17/2015   HGB 12.7 09/17/2015   HCT 38.9 09/17/2015   MCV 92.2 09/17/2015   PLT 230 09/17/2015   NEUTROABS 3.2 09/17/2015     ASSESSMENT & PLAN:  Breast cancer of lower-outer quadrant of left female  breast ([place]) Left lumpectomy 09/25/2015: IDC with DCIS, 1.2 cm, margins negative, 0/4 lymph nodes negative, grade 3, ER 70%, PR 5%, HER-2 positive ratio 3.45, Ki-67 10%, T1 CN 0 stage IA  Pathology counseling: I discussed the final pathology report of the patient provided  a copy of this report. I discussed the margins as well as lymph node surgeries. We also discussed the final staging along with previously performed ER/PR and HER-2/neu testing.  Treatment plan: 1. Adjuvant chemotherapy with TCH 6 cycles followed by Herceptin every 3 weeks for 1 year 2. Followed by adjuvant radiation 3. Followed by adjuvant antiestrogen therapy --------------------------------------------------------------------------------------------------------------------------------------------- Chemotherapy counseling: I discussed with the patient the change in treatment plan from original thought off weekly Taxol Herceptin to now every 3 week TCH primarily because of the bigger size of the tumor than what we had originally seen on the initial imaging studies. It is still extremely favorable given the fact that no lymph nodes were involved. I discussed the risks and benefits of  systemic chemotherapy including the risk of nausea, hair loss, low blood counts, risk of infection, neuropathy as well as reversible cardiomyopathy risk of Herceptin.  Plan to start chemotherapy in 10/31/2015. Patient is scheduled for echocardiogram, chemotherapy class.   No orders of the defined types were placed in this encounter.  The patient has a good understanding of the overall plan. she agrees with it. she will call with any problems that may develop before the next visit here.   Rulon Eisenmenger, MD 10/03/15

## 2015-10-03 NOTE — Assessment & Plan Note (Signed)
Left lumpectomy 09/25/2015: IDC with DCIS, 1.2 cm, margins negative, 0/4 lymph nodes negative, grade 3, ER 70%, PR 5%, HER-2 positive ratio 3.45, Ki-67 10%, T1 CN 0 stage IA  Pathology counseling: I discussed the final pathology report of the patient provided  a copy of this report. I discussed the margins as well as lymph node surgeries. We also discussed the final staging along with previously performed ER/PR and HER-2/neu testing.  Treatment plan: 1. Adjuvant chemotherapy with TCH 6 cycles followed by Herceptin every 3 weeks for 1 year 2. Followed by adjuvant radiation 3. Followed by adjuvant antiestrogen therapy --------------------------------------------------------------------------------------------------------------------------------------------- Chemotherapy counseling: I discussed with the patient the change in treatment plan from original thought off weekly Taxol Herceptin to now every 3 week TCH primarily because of the bigger size of the tumor than what we had originally seen on the initial imaging studies. It is still extremely favorable given the fact that no lymph nodes were involved. I discussed the risks and benefits of systemic chemotherapy including the risk of nausea, hair loss, low blood counts, risk of infection, neuropathy as well as reversible cardiomyopathy risk of Herceptin.  Plan to start chemotherapy in 3 weeks Patient is scheduled for echocardiogram, chemotherapy class. 

## 2015-10-06 ENCOUNTER — Encounter: Payer: Self-pay | Admitting: Physician Assistant

## 2015-10-06 ENCOUNTER — Ambulatory Visit (HOSPITAL_COMMUNITY)
Admission: RE | Admit: 2015-10-06 | Discharge: 2015-10-06 | Disposition: A | Discharge status: Home / Self Care | Payer: BLUE CROSS/BLUE SHIELD | Source: Ambulatory Visit | Attending: Internal Medicine | Admitting: Internal Medicine

## 2015-10-06 ENCOUNTER — Encounter (HOSPITAL_COMMUNITY): Payer: Self-pay | Admitting: Internal Medicine

## 2015-10-06 ENCOUNTER — Ambulatory Visit (HOSPITAL_BASED_OUTPATIENT_CLINIC_OR_DEPARTMENT_OTHER)
Admission: RE | Admit: 2015-10-06 | Discharge: 2015-10-06 | Disposition: A | Discharge status: Home / Self Care | Payer: BLUE CROSS/BLUE SHIELD | Source: Ambulatory Visit | Attending: Internal Medicine | Admitting: Internal Medicine

## 2015-10-06 VITALS — BP 116/70 | HR 74 | Wt 164.5 lb

## 2015-10-06 DIAGNOSIS — Z7982 Long term (current) use of aspirin: Secondary | ICD-10-CM | POA: Insufficient documentation

## 2015-10-06 DIAGNOSIS — K589 Irritable bowel syndrome without diarrhea: Secondary | ICD-10-CM | POA: Diagnosis not present

## 2015-10-06 DIAGNOSIS — Z170 Estrogen receptor positive status [ER+]: Secondary | ICD-10-CM | POA: Diagnosis not present

## 2015-10-06 DIAGNOSIS — J45909 Unspecified asthma, uncomplicated: Secondary | ICD-10-CM | POA: Insufficient documentation

## 2015-10-06 DIAGNOSIS — E063 Autoimmune thyroiditis: Secondary | ICD-10-CM | POA: Insufficient documentation

## 2015-10-06 DIAGNOSIS — N854 Malposition of uterus: Secondary | ICD-10-CM | POA: Diagnosis not present

## 2015-10-06 DIAGNOSIS — C50512 Malignant neoplasm of lower-outer quadrant of left female breast: Secondary | ICD-10-CM | POA: Insufficient documentation

## 2015-10-06 DIAGNOSIS — K219 Gastro-esophageal reflux disease without esophagitis: Secondary | ICD-10-CM | POA: Diagnosis not present

## 2015-10-06 DIAGNOSIS — Z87891 Personal history of nicotine dependence: Secondary | ICD-10-CM | POA: Insufficient documentation

## 2015-10-06 DIAGNOSIS — F419 Anxiety disorder, unspecified: Secondary | ICD-10-CM | POA: Diagnosis not present

## 2015-10-06 LAB — ECHOCARDIOGRAM COMPLETE
EWDT: 173 ms
FS: 37 % (ref 28–44)
IV/PV OW: 0.87
LA diam end sys: 34 mm
LA vol index: 27.3 mL/m2
LA vol: 48.1 mL
LADIAMINDEX: 1.93 cm/m2
LASIZE: 34 mm
LAVOLA4C: 37 mL
LVOT area: 2.84 cm2
LVOT diameter: 19 mm
MV Dec: 173
MV pk A vel: 92.6 m/s
MVPG: 3 mmHg
MVPKEVEL: 82.5 m/s
PW: 8.7 mm — AB (ref 0.6–1.1)

## 2015-10-06 NOTE — Patient Instructions (Signed)
We will contact you in 3 months to schedule your next appointment and echocardiogram  

## 2015-10-06 NOTE — Addendum Note (Signed)
Encounter addended by: Scarlette Calico, RN on: 10/06/2015 11:02 AM<BR>    Actions taken: Sign clinical note

## 2015-10-06 NOTE — Progress Notes (Signed)
Cardio-Oncology Clinic Consult Note   Referring Physician: Dr. Lindi Adie Primary Care: Donella Stade, PA-C Heme/Onc: Dr. Lindi Adie Surgeon: Dr. Erroll Luna Rad/Onc: Dr. Gery Pray Primary Cardiologist: Dr. Haroldine Laws   HPI:  Jenna [name] is a 55 y.o. female with hypothyroidism, diagnosed with left breast CA in 9/17. ER+, PR+, HER-2 positive disease referred to Cardio-Oncology clinic for monitoring of cardiotoxicity while undergoing chemo.   Breast cancer of lower-outer quadrant of left female breast ([place])   09/10/2015 Initial Diagnosis    Screening detected left breast asymmetry: 2 adjacent masses 4:00: 8 mm and 3:30: 9 mm; axilla negative, biopsy grade 2-3 IDC, ER 70%, PR 5%, Ki-67 10%, HER-2 positive ratio 3.41, T1BN0 stage IA     09/25/2015 Surgery    Left lumpectomy: IDC with DCIS, 1.2 cm, margins negative, 0/4 lymph nodes negative, grade 3, ER 70%, PR 5%, HER-2 positive ratio 3.45, Ki-67 10%, T1 CN 0 stage IA   Treatment Plan 1. Adjuvant chemotherapy with TCH 6 cycles followed by Herceptin every 3 weeks for 1 year 2. Followed by adjuvant radiation 3. Followed by adjuvant antiestrogen therapy   Echo today reviewed personally EF 55-60% Lateral s' 10.14 GLS -21.2%  Still recovering from surgery. No h/o heart disease. Used to smoke but quit around 2002. Has been struggling with weight gain and ab bloating. No CP or SOB.   Review of Systems: [y] = yes, _0  = no   General: Weight gain Blue.Reese ]; Weight loss _1 ; Anorexia _2 ; Fatigue _3 ; Fever _4 ; Chills _5 ; Weakness _6   Cardiac: Chest pain/pressure _7 ; Resting SOB _8 ; Exertional SOB _9 ; Orthopnea _10 ; Pedal Edema _11 ; Palpitations _12 ; Syncope _13 ; Presyncope _14 ; Paroxysmal nocturnal dyspnea_15   Pulmonary: Cough _16 ; Wheezing_17 ; Hemoptysis_18 ; Sputum _19 ; Snoring _20   GI: Vomiting_21 ; Dysphagia_22 ; Melena_23 ; Hematochezia _24 ; Heartburn_25 ; Abdominal pain _26 ; Constipation _27 ; Diarrhea _28 ; BRBPR _29   GU: Hematuria[  ]; Dysuria _30 ; Nocturia_31   Vascular: Pain in legs with walking _32 ; Pain in feet with lying flat _33 ; Non-healing sores _34 ; Stroke _35 ; TIA _36 ; Slurred speech _37 ;  Neuro: Headaches_38 ; Vertigo_39 ; Seizures_40 ; Paresthesias_41 ;Blurred vision _42 ; Diplopia _43 ; Vision changes _44   Ortho/Skin: Arthritis Blue.Reese ]; Joint pain _45 ; Muscle pain _46 ; Joint swelling _47 ; Back Pain _48 ; Rash _49   Psych: Depression_50 ; Anxiety[y ]  Heme: Bleeding problems _51 ; Clotting disorders _52 ; Anemia _53   Endocrine: Diabetes _54 ; Thyroid dysfunction_55    Past Medical History:  Diagnosis Date  . Anxiety   . Asthma   . Breast cancer of lower-outer quadrant of left female breast ([place]) 09/12/2015  . Complication of anesthesia    hard time waking up with gallbladder  . GERD (gastroesophageal reflux disease)   . Hashimoto's disease   . Hypothyroidism   . Irritable bowel   . Uterine displacement     Current Outpatient Prescriptions  Medication Sig Dispense Refill  . aspirin EC 81 MG tablet Take 81 mg by mouth daily.    . DULoxetine (CYMBALTA) 30 MG capsule Take 1 capsule (30 mg total) by mouth daily. 90 capsule 1  . Lactobacillus (DIGESTIVE HEALTH PROBIOTIC PO) Take by mouth.    . levothyroxine (SYNTHROID, LEVOTHROID) 50 MCG tablet TAKE 1 TABLET (50 MCG TOTAL) BY MOUTH  DAILY. 90 tablet 1  . linaclotide (LINZESS) 290 MCG CAPS capsule Take 1 capsule (290 mcg total) by mouth daily. 90 capsule 1  . liothyronine (CYTOMEL) 25 MCG tablet Take 1 tablet (25 mcg total) by mouth daily. 90 tablet 0  . omeprazole (PRILOSEC) 20 MG capsule Take 1 capsule (20 mg total) by mouth 2 (two) times daily before a meal. 180 capsule 4  . Oxymetazoline HCl (NASAL SPRAY NA) Place into the nose.    . triamcinolone (NASACORT) 55 MCG/ACT AERO nasal inhaler Place into the nose.    . Albuterol Sulfate (PROAIR RESPICLICK) 902 (90 BASE) MCG/ACT AEPB Inhale 2 puffs into the lungs every 6 (six) hours. (Patient not taking: Reported on 10/06/2015) 3  each 3  . oxyCODONE-acetaminophen (ROXICET) 5-325 MG tablet Take 1-2 tablets by mouth every 4 (four) hours as needed. (Patient not taking: Reported on 10/06/2015) 30 tablet 0  . Polyethylene Glycol 3350 (MIRALAX PO) Take by mouth.    . Simethicone (GAS-X PO) Take by mouth.     No current facility-administered medications for this encounter.     Allergies  Allergen Reactions  . Sulfa Antibiotics Rash      Social History   Social History  . Marital status: Married    Spouse name: N/A  . Number of children: N/A  . Years of education: N/A   Occupational History  . Not on file.   Social History Main Topics  . Smoking status: Former Research scientist (life sciences)  . Smokeless tobacco: Never Used  . Alcohol use 0.6 oz/week    1 Standard drinks or equivalent per week     Comment: social  . Drug use: No  . Sexual activity: Yes    Partners: Male   Other Topics Concern  . Not on file   Social History Narrative  . No narrative on file      Family History  Problem Relation Age of Onset  . Diabetes Mother   . Hypertension Mother   . Hyperlipidemia Mother   . Kidney disease Mother   . Cancer Maternal Grandfather     Vitals:   10/06/15 1001  BP: 116/70  Pulse: 74  SpO2: 100%  Weight: 164 lb 8 oz (74.6 kg)    PHYSICAL EXAM: General:  Well appearing. No respiratory difficulty HEENT: normal Neck: supple. no JVD. R port-a-cath healing well  Carotids 2+ bilat; no bruits. No lymphadenopathy or thryomegaly appreciated. Cor: PMI nondisplaced. Regular rate & rhythm. No rubs, gallops or murmurs. Lungs: clear Abdomen: soft, nontender, nondistended. No hepatosplenomegaly. No bruits or masses. Good bowel sounds. Extremities: no cyanosis, clubbing, rash, edema Neuro: alert & oriented x 3, cranial nerves grossly intact. moves all 4 extremities w/o difficulty. Affect pleasant.   ASSESSMENT & PLAN:  1. Left breast Cancer, triple + positive --s/p left lumpectomy and LN dissection 9/17 treatment plan  will be: --. Adjuvant chemotherapy with TCH 6 cycles followed by Herceptin every 3 weeks for 1 year (start October 21) --Followed by adjuvant radiation --Followed by adjuvant antiestrogen therapy --Explained incidence of Herceptin cardiotoxicity and role of Cardio-oncology clinic at length. Echo images reviewed personally. All parameters stable. Reviewed signs and symptoms of HF to look for. Ok to start Herceptin. Follow-up with echo in 3 months.   Bensimhon, Daniel,MD 11:01 AM

## 2015-10-06 NOTE — Progress Notes (Signed)
  Echocardiogram 2D Echocardiogram has been performed.  Diamond Nickel 10/06/2015, 9:34 AM

## 2015-10-10 ENCOUNTER — Telehealth: Payer: Self-pay | Admitting: *Deleted

## 2015-10-10 NOTE — Telephone Encounter (Signed)
Pt is requesting medications for chemo sent to [facility]  She starts treatment on 10/27.  She thinks Dr. Lindi Adie was going to order anti- nausea meds, steroids, sleeping medication and EMLA cream.

## 2015-10-13 ENCOUNTER — Other Ambulatory Visit: Payer: Self-pay | Admitting: Hematology and Oncology

## 2015-10-13 DIAGNOSIS — Z170 Estrogen receptor positive status [ER+]: Principal | ICD-10-CM

## 2015-10-13 DIAGNOSIS — C50512 Malignant neoplasm of lower-outer quadrant of left female breast: Secondary | ICD-10-CM

## 2015-10-13 MED ORDER — PROCHLORPERAZINE MALEATE 10 MG PO TABS: 10.0000 mg | ORAL_TABLET | Freq: Four times a day (QID) | ORAL | 1 refills | Status: DC | PRN

## 2015-10-13 MED ORDER — LORAZEPAM 0.5 MG PO TABS: 0.5000 mg | ORAL_TABLET | Freq: Every day | ORAL | 0 refills | Status: DC

## 2015-10-13 MED ORDER — LIDOCAINE-PRILOCAINE 2.5-2.5 % EX CREA: TOPICAL_CREAM | CUTANEOUS | 3 refills | Status: DC

## 2015-10-13 MED ORDER — DEXAMETHASONE 4 MG PO TABS: 4.0000 mg | ORAL_TABLET | Freq: Two times a day (BID) | ORAL | 1 refills | Status: DC

## 2015-10-13 MED ORDER — ONDANSETRON HCL 8 MG PO TABS: 8.0000 mg | ORAL_TABLET | Freq: Two times a day (BID) | ORAL | 1 refills | Status: DC | PRN

## 2015-10-13 NOTE — Progress Notes (Signed)
START ON PATHWAY REGIMEN - Breast  [id number]: TCH - Docetaxel, Carboplatin With Concurrent Trastuzumab q21 Days x 6 Cycles, Followed by Trastuzumab  q21 Days x 11 Cycles  Docetaxel + Carboplatin + Trastuzumab [facility]):   A cycle is every 21 days:     Trastuzumab (Herceptin(R)) 8 mg/kg in 250 mL NS IV over 90 minutes as a loading dose first dose only. LVEF assessment recommended at baseline. Dose Mod: None     Trastuzumab (Herceptin(R)) 6 mg/kg in 250 mL NS IV over 30 minutes as a maintenance dose subsequent cycles. Recommended Monitoring: LVEF q3-4 months for adjuvant treatment, or with the development of cardiac symptoms and regimen changes for  metastatic treatment. Dose Mod: None     Docetaxel (Taxotere(R)) 75 mg/m2 in 250 mL NS IV over 1 hour followed by Dose Mod: None     Carboplatin (Paraplatin(R)) AUC=6 in 250 mL NS IV over 30-60 minutes day 1 only Dose Mod: None Additional Orders: Premedicate with dexamethasone 8 mg PO bid for three days beginning 1 day prior to therapy * All AUC calculations intended to be used in [place] formula.  **Always confirm dose/schedule in your pharmacy ordering system**    Trastuzumab (Maintenance Post TCH) x 11 Cycles:   A cycle is every 21 days:     Trastuzumab (Herceptin(R)) 6 mg/kg in 250 mL NS IV over 30 minutes, if tolerated previously. Dose Mod: None Additional Orders: Recommended Monitoring: LVEF q3-4 months for adjuvant treatment, or with the development of cardiac symptoms and regimen changes for metastatic treatment.  **Always confirm dose/schedule in your pharmacy ordering system**    Patient Characteristics: Adjuvant Therapy, Node Negative, HER2/neu Positive, ER Positive, Stage Ia and Ib, T1c AJCC Stage Grouping: IA Current Disease Status: No Distant Mets or Local Recurrence AJCC M Stage: 0 ER Status: Positive (+) AJCC N Stage: 0 AJCC T Stage: 1c HER2/neu: Positive (+) PR Status: Positive (+) Node Status: Negative (-)  Intent of  Therapy: Curative Intent, Discussed with Patient

## 2015-10-14 ENCOUNTER — Encounter: Payer: Self-pay | Admitting: Hematology and Oncology

## 2015-10-17 NOTE — Telephone Encounter (Signed)
Called pt back to discuss questions pt had regarding new medications.  Pt also asking about lorazepam medication for sleep.  Pt stated her pharmacy did not have lorazepam.  [facility].  Pt verbalized understanding of all content discussed and is without further questions or concerns at time of call.

## 2015-10-27 ENCOUNTER — Other Ambulatory Visit: Payer: Self-pay | Admitting: Physician Assistant

## 2015-10-31 ENCOUNTER — Other Ambulatory Visit: Payer: Self-pay | Admitting: Oncology

## 2015-10-31 ENCOUNTER — Encounter: Payer: Self-pay | Admitting: *Deleted

## 2015-10-31 ENCOUNTER — Ambulatory Visit (HOSPITAL_BASED_OUTPATIENT_CLINIC_OR_DEPARTMENT_OTHER): Payer: BLUE CROSS/BLUE SHIELD

## 2015-10-31 ENCOUNTER — Other Ambulatory Visit (HOSPITAL_BASED_OUTPATIENT_CLINIC_OR_DEPARTMENT_OTHER): Payer: BLUE CROSS/BLUE SHIELD

## 2015-10-31 VITALS — BP 126/61 | HR 75 | Temp 98.8°F | Resp 17

## 2015-10-31 DIAGNOSIS — Z5111 Encounter for antineoplastic chemotherapy: Secondary | ICD-10-CM | POA: Diagnosis not present

## 2015-10-31 DIAGNOSIS — Z5189 Encounter for other specified aftercare: Secondary | ICD-10-CM | POA: Diagnosis not present

## 2015-10-31 DIAGNOSIS — Z5112 Encounter for antineoplastic immunotherapy: Secondary | ICD-10-CM

## 2015-10-31 DIAGNOSIS — Z170 Estrogen receptor positive status [ER+]: Principal | ICD-10-CM

## 2015-10-31 DIAGNOSIS — C50512 Malignant neoplasm of lower-outer quadrant of left female breast: Secondary | ICD-10-CM | POA: Diagnosis not present

## 2015-10-31 LAB — CBC WITH DIFFERENTIAL/PLATELET
BASO%: 0.1 % (ref 0.0–2.0)
BASOS ABS: 0 10*3/uL (ref 0.0–0.1)
EOS ABS: 0 10*3/uL (ref 0.0–0.5)
EOS%: 0 % (ref 0.0–7.0)
HCT: 39.4 % (ref 34.8–46.6)
HGB: 12.9 g/dL (ref 11.6–15.9)
LYMPH%: 13 % — AB (ref 14.0–49.7)
MCH: 29.3 pg (ref 25.1–34.0)
MCHC: 32.8 g/dL (ref 31.5–36.0)
MCV: 89.4 fL (ref 79.5–101.0)
MONO#: 0.3 10*3/uL (ref 0.1–0.9)
MONO%: 3.7 % (ref 0.0–14.0)
NEUT%: 83.2 % — ABNORMAL HIGH (ref 38.4–76.8)
NEUTROS ABS: 7.7 10*3/uL — AB (ref 1.5–6.5)
Platelets: 299 10*3/uL (ref 145–400)
RBC: 4.41 10*6/uL (ref 3.70–5.45)
RDW: 12.3 % (ref 11.2–14.5)
WBC: 9.3 10*3/uL (ref 3.9–10.3)
lymph#: 1.2 10*3/uL (ref 0.9–3.3)

## 2015-10-31 LAB — COMPREHENSIVE METABOLIC PANEL
ALT: 25 U/L (ref 0–55)
AST: 18 U/L (ref 5–34)
Albumin: 3.4 g/dL — ABNORMAL LOW (ref 3.5–5.0)
Alkaline Phosphatase: 81 U/L (ref 40–150)
Anion Gap: 10 mEq/L (ref 3–11)
BUN: 9.2 mg/dL (ref 7.0–26.0)
CO2: 26 mEq/L (ref 22–29)
Calcium: 9.8 mg/dL (ref 8.4–10.4)
Chloride: 108 mEq/L (ref 98–109)
Creatinine: 0.7 mg/dL (ref 0.6–1.1)
GLUCOSE: 147 mg/dL — AB (ref 70–140)
SODIUM: 144 meq/L (ref 136–145)
Total Bilirubin: 0.32 mg/dL (ref 0.20–1.20)
Total Protein: 7.5 g/dL (ref 6.4–8.3)

## 2015-10-31 MED ORDER — SODIUM CHLORIDE 0.9 % IV SOLN
Freq: Once | INTRAVENOUS | Status: AC
  Administered 2015-10-31: 11:00:00 via INTRAVENOUS
  Filled 2015-10-31: qty 5

## 2015-10-31 MED ORDER — SODIUM CHLORIDE 0.9 % IV SOLN
Freq: Once | INTRAVENOUS | Status: AC
  Administered 2015-10-31: 10:00:00 via INTRAVENOUS

## 2015-10-31 MED ORDER — SODIUM CHLORIDE 0.9 % IV SOLN
604.0000 mg | Freq: Once | INTRAVENOUS | Status: AC
  Administered 2015-10-31: 600 mg via INTRAVENOUS
  Filled 2015-10-31: qty 60

## 2015-10-31 MED ORDER — PALONOSETRON HCL INJECTION 0.25 MG/5ML
0.2500 mg | Freq: Once | INTRAVENOUS | Status: AC
  Administered 2015-10-31: 0.25 mg via INTRAVENOUS

## 2015-10-31 MED ORDER — SODIUM CHLORIDE 0.9 % IV SOLN
8.0000 mg/kg | Freq: Once | INTRAVENOUS | Status: AC
  Administered 2015-10-31: 588 mg via INTRAVENOUS
  Filled 2015-10-31: qty 28

## 2015-10-31 MED ORDER — DIPHENHYDRAMINE HCL 25 MG PO CAPS
50.0000 mg | ORAL_CAPSULE | Freq: Once | ORAL | Status: AC
  Administered 2015-10-31: 50 mg via ORAL

## 2015-10-31 MED ORDER — ACETAMINOPHEN 325 MG PO TABS
ORAL_TABLET | ORAL | Status: AC
  Filled 2015-10-31: qty 2

## 2015-10-31 MED ORDER — PEGFILGRASTIM 6 MG/0.6ML ~~LOC~~ PSKT
6.0000 mg | PREFILLED_SYRINGE | Freq: Once | SUBCUTANEOUS | Status: AC
  Administered 2015-10-31: 6 mg via SUBCUTANEOUS
  Filled 2015-10-31: qty 0.6

## 2015-10-31 MED ORDER — ACETAMINOPHEN 325 MG PO TABS
650.0000 mg | ORAL_TABLET | Freq: Once | ORAL | Status: AC
  Administered 2015-10-31: 650 mg via ORAL

## 2015-10-31 MED ORDER — SODIUM CHLORIDE 0.9 % IV SOLN: Freq: Once | INTRAVENOUS | Status: DC

## 2015-10-31 MED ORDER — DOCETAXEL CHEMO INJECTION 160 MG/16ML
75.0000 mg/m2 | Freq: Once | INTRAVENOUS | Status: AC
  Administered 2015-10-31: 140 mg via INTRAVENOUS
  Filled 2015-10-31: qty 14

## 2015-10-31 MED ORDER — PALONOSETRON HCL INJECTION 0.25 MG/5ML
INTRAVENOUS | Status: AC
  Filled 2015-10-31: qty 5

## 2015-10-31 MED ORDER — SODIUM CHLORIDE 0.9% FLUSH
10.0000 mL | INTRAVENOUS | Status: DC | PRN
  Administered 2015-10-31: 10 mL
  Filled 2015-10-31: qty 10

## 2015-10-31 MED ORDER — DIPHENHYDRAMINE HCL 25 MG PO CAPS
ORAL_CAPSULE | ORAL | Status: AC
  Filled 2015-10-31: qty 2

## 2015-10-31 MED ORDER — HEPARIN SOD (PORK) LOCK FLUSH 100 UNIT/ML IV SOLN
500.0000 [IU] | Freq: Once | INTRAVENOUS | Status: AC | PRN
  Administered 2015-10-31: 500 [IU]
  Filled 2015-10-31: qty 5

## 2015-10-31 MED ORDER — SODIUM CHLORIDE 0.9 % IV SOLN
75.0000 mg/m2 | Freq: Once | INTRAVENOUS | Status: DC
  Filled 2015-10-31: qty 14

## 2015-10-31 NOTE — Patient Instructions (Addendum)
[facility] Discharge Instructions for Patients Receiving Chemotherapy  Today you received the following chemotherapy agents Herceptin, Taxotere and Carboplatin.   To help prevent nausea and vomiting after your treatment, we encourage you to take your nausea medication as directed. NO ZOFRAN FOR 3 DAYS, TAKE COMPAZINE DURING THAT TIME.   If you develop nausea and vomiting that is not controlled by your nausea medication, call the clinic.   BELOW ARE SYMPTOMS THAT SHOULD BE REPORTED IMMEDIATELY:  *FEVER GREATER THAN 100.5 F  *CHILLS WITH OR WITHOUT FEVER  NAUSEA AND VOMITING THAT IS NOT CONTROLLED WITH YOUR NAUSEA MEDICATION  *UNUSUAL SHORTNESS OF BREATH  *UNUSUAL BRUISING OR BLEEDING  TENDERNESS IN MOUTH AND THROAT WITH OR WITHOUT PRESENCE OF ULCERS  *URINARY PROBLEMS  *BOWEL PROBLEMS  UNUSUAL RASH Items with * indicate a potential emergency and should be followed up as soon as possible.  Feel free to call the clinic you have any questions or concerns. The clinic phone number is [phone].  Please show the [facility] at check-in to the Emergency Department and triage nurse.   Trastuzumab injection for infusion What is this medicine? TRASTUZUMAB (tras TOO zoo mab) is a monoclonal antibody. It is used to treat breast cancer and stomach cancer. This medicine may be used for other purposes; ask your health care provider or pharmacist if you have questions. What should I tell my health care provider before I take this medicine? They need to know if you have any of these conditions: -heart disease -heart failure -infection (especially a virus infection such as chickenpox, cold sores, or herpes) -lung or breathing disease, like asthma -recent or ongoing radiation therapy -an unusual or allergic reaction to trastuzumab, benzyl alcohol, or other medications, foods, dyes, or preservatives -pregnant or trying to get pregnant -breast-feeding How should I  use this medicine? This drug is given as an infusion into a vein. It is administered in a hospital or clinic by a specially trained health care professional. Talk to your pediatrician regarding the use of this medicine in children. This medicine is not approved for use in children. Overdosage: If you think you have taken too much of this medicine contact a poison control center or emergency room at once. NOTE: This medicine is only for you. Do not share this medicine with others. What if I miss a dose? It is important not to miss a dose. Call your doctor or health care professional if you are unable to keep an appointment. What may interact with this medicine? -doxorubicin -warfarin This list may not describe all possible interactions. Give your health care provider a list of all the medicines, herbs, non-prescription drugs, or dietary supplements you use. Also tell them if you smoke, drink alcohol, or use illegal drugs. Some items may interact with your medicine. What should I watch for while using this medicine? Visit your doctor for checks on your progress. Report any side effects. Continue your course of treatment even though you feel ill unless your doctor tells you to stop. Call your doctor or health care professional for advice if you get a fever, chills or sore throat, or other symptoms of a cold or flu. Do not treat yourself. Try to avoid being around people who are sick. You may experience fever, chills and shaking during your first infusion. These effects are usually mild and can be treated with other medicines. Report any side effects during the infusion to your health care professional. Fever and chills usually do  not happen with later infusions. Do not become pregnant while taking this medicine or for 7 months after stopping it. Women should inform their doctor if they wish to become pregnant or think they might be pregnant. Women of child-bearing potential will need to have a negative  pregnancy test before starting this medicine. There is a potential for serious side effects to an unborn child. Talk to your health care professional or pharmacist for more information. Do not breast-feed an infant while taking this medicine or for 7 months after stopping it. Women must use effective birth control with this medicine. What side effects may I notice from receiving this medicine? Side effects that you should report to your doctor or other health care professional as soon as possible: -breathing difficulties -chest pain or palpitations -cough -dizziness or fainting -fever or chills, sore throat -skin rash, itching or hives -swelling of the legs or ankles -unusually weak or tired Side effects that usually do not require medical attention (report to your doctor or other health care professional if they continue or are bothersome): -loss of appetite -headache -muscle aches -nausea This list may not describe all possible side effects. Call your doctor for medical advice about side effects. You may report side effects to FDA at 1-800-FDA-1088. Where should I keep my medicine? This drug is given in a hospital or clinic and will not be stored at home. NOTE: This sheet is a summary. It may not cover all possible information. If you have questions about this medicine, talk to your doctor, pharmacist, or health care provider.    2016, Elsevier/Gold Standard. (2014-03-29 11:49:32) Docetaxel injection What is this medicine? DOCETAXEL (doe se TAX el) is a chemotherapy drug. It targets fast dividing cells, like cancer cells, and causes these cells to die. This medicine is used to treat many types of cancers like breast cancer, certain stomach cancers, head and neck cancer, lung cancer, and prostate cancer. This medicine may be used for other purposes; ask your health care provider or pharmacist if you have questions. What should I tell my health care provider before I take this  medicine? They need to know if you have any of these conditions: -infection (especially a virus infection such as chickenpox, cold sores, or herpes) -liver disease -low blood counts, like low white cell, platelet, or red cell counts -an unusual or allergic reaction to docetaxel, polysorbate 80, other chemotherapy agents, other medicines, foods, dyes, or preservatives -pregnant or trying to get pregnant -breast-feeding How should I use this medicine? This drug is given as an infusion into a vein. It is administered in a hospital or clinic by a specially trained health care professional. Talk to your pediatrician regarding the use of this medicine in children. Special care may be needed. Overdosage: If you think you have taken too much of this medicine contact a poison control center or emergency room at once. NOTE: This medicine is only for you. Do not share this medicine with others. What if I miss a dose? It is important not to miss your dose. Call your doctor or health care professional if you are unable to keep an appointment. What may interact with this medicine? -cyclosporine -erythromycin -ketoconazole -medicines to increase blood counts like filgrastim, pegfilgrastim, sargramostim -vaccines Talk to your doctor or health care professional before taking any of these medicines: -acetaminophen -aspirin -ibuprofen -ketoprofen -naproxen This list may not describe all possible interactions. Give your health care provider a list of all the medicines, herbs, non-prescription drugs, or  dietary supplements you use. Also tell them if you smoke, drink alcohol, or use illegal drugs. Some items may interact with your medicine. What should I watch for while using this medicine? Your condition will be monitored carefully while you are receiving this medicine. You will need important blood work done while you are taking this medicine. This drug may make you feel generally unwell. This is not  uncommon, as chemotherapy can affect healthy cells as well as cancer cells. Report any side effects. Continue your course of treatment even though you feel ill unless your doctor tells you to stop. In some cases, you may be given additional medicines to help with side effects. Follow all directions for their use. Call your doctor or health care professional for advice if you get a fever, chills or sore throat, or other symptoms of a cold or flu. Do not treat yourself. This drug decreases your body's ability to fight infections. Try to avoid being around people who are sick. This medicine may increase your risk to bruise or bleed. Call your doctor or health care professional if you notice any unusual bleeding. This medicine may contain alcohol in the product. You may get drowsy or dizzy. Do not drive, use machinery, or do anything that needs mental alertness until you know how this medicine affects you. Do not stand or sit up quickly, especially if you are an older patient. This reduces the risk of dizzy or fainting spells. Avoid alcoholic drinks. Do not become pregnant while taking this medicine. Women should inform their doctor if they wish to become pregnant or think they might be pregnant. There is a potential for serious side effects to an unborn child. Talk to your health care professional or pharmacist for more information. Do not breast-feed an infant while taking this medicine. What side effects may I notice from receiving this medicine? Side effects that you should report to your doctor or health care professional as soon as possible: -allergic reactions like skin rash, itching or hives, swelling of the face, lips, or tongue -low blood counts - This drug may decrease the number of white blood cells, red blood cells and platelets. You may be at increased risk for infections and bleeding. -signs of infection - fever or chills, cough, sore throat, pain or difficulty passing urine -signs of decreased  platelets or bleeding - bruising, pinpoint red spots on the skin, black, tarry stools, nosebleeds -signs of decreased red blood cells - unusually weak or tired, fainting spells, lightheadedness -breathing problems -fast or irregular heartbeat -low blood pressure -mouth sores -nausea and vomiting -pain, swelling, redness or irritation at the injection site -pain, tingling, numbness in the hands or feet -swelling of the ankle, feet, hands -weight gain Side effects that usually do not require medical attention (report to your prescriber or health care professional if they continue or are bothersome): -bone pain -complete hair loss including hair on your head, underarms, pubic hair, eyebrows, and eyelashes -diarrhea -excessive tearing -changes in the color of fingernails -loosening of the fingernails -nausea -muscle pain -red flush to skin -sweating -weak or tired This list may not describe all possible side effects. Call your doctor for medical advice about side effects. You may report side effects to FDA at 1-800-FDA-1088. Where should I keep my medicine? This drug is given in a hospital or clinic and will not be stored at home. NOTE: This sheet is a summary. It may not cover all possible information. If you have questions about this medicine,  talk to your doctor, pharmacist, or health care provider.    2016, Elsevier/Gold Standard. (2014-01-07 16:04:57) Carboplatin injection What is this medicine? CARBOPLATIN (KAR boe pla tin) is a chemotherapy drug. It targets fast dividing cells, like cancer cells, and causes these cells to die. This medicine is used to treat ovarian cancer and many other cancers. This medicine may be used for other purposes; ask your health care provider or pharmacist if you have questions. What should I tell my health care provider before I take this medicine? They need to know if you have any of these conditions: -blood disorders -hearing problems -kidney  disease -recent or ongoing radiation therapy -an unusual or allergic reaction to carboplatin, cisplatin, other chemotherapy, other medicines, foods, dyes, or preservatives -pregnant or trying to get pregnant -breast-feeding How should I use this medicine? This drug is usually given as an infusion into a vein. It is administered in a hospital or clinic by a specially trained health care professional. Talk to your pediatrician regarding the use of this medicine in children. Special care may be needed. Overdosage: If you think you have taken too much of this medicine contact a poison control center or emergency room at once. NOTE: This medicine is only for you. Do not share this medicine with others. What if I miss a dose? It is important not to miss a dose. Call your doctor or health care professional if you are unable to keep an appointment. What may interact with this medicine? -medicines for seizures -medicines to increase blood counts like filgrastim, pegfilgrastim, sargramostim -some antibiotics like amikacin, gentamicin, neomycin, streptomycin, tobramycin -vaccines Talk to your doctor or health care professional before taking any of these medicines: -acetaminophen -aspirin -ibuprofen -ketoprofen -naproxen This list may not describe all possible interactions. Give your health care provider a list of all the medicines, herbs, non-prescription drugs, or dietary supplements you use. Also tell them if you smoke, drink alcohol, or use illegal drugs. Some items may interact with your medicine. What should I watch for while using this medicine? Your condition will be monitored carefully while you are receiving this medicine. You will need important blood work done while you are taking this medicine. This drug may make you feel generally unwell. This is not uncommon, as chemotherapy can affect healthy cells as well as cancer cells. Report any side effects. Continue your course of treatment even  though you feel ill unless your doctor tells you to stop. In some cases, you may be given additional medicines to help with side effects. Follow all directions for their use. Call your doctor or health care professional for advice if you get a fever, chills or sore throat, or other symptoms of a cold or flu. Do not treat yourself. This drug decreases your body's ability to fight infections. Try to avoid being around people who are sick. This medicine may increase your risk to bruise or bleed. Call your doctor or health care professional if you notice any unusual bleeding. Be careful brushing and flossing your teeth or using a toothpick because you may get an infection or bleed more easily. If you have any dental work done, tell your dentist you are receiving this medicine. Avoid taking products that contain aspirin, acetaminophen, ibuprofen, naproxen, or ketoprofen unless instructed by your doctor. These medicines may hide a fever. Do not become pregnant while taking this medicine. Women should inform their doctor if they wish to become pregnant or think they might be pregnant. There is a potential for  serious side effects to an unborn child. Talk to your health care professional or pharmacist for more information. Do not breast-feed an infant while taking this medicine. What side effects may I notice from receiving this medicine? Side effects that you should report to your doctor or health care professional as soon as possible: -allergic reactions like skin rash, itching or hives, swelling of the face, lips, or tongue -signs of infection - fever or chills, cough, sore throat, pain or difficulty passing urine -signs of decreased platelets or bleeding - bruising, pinpoint red spots on the skin, black, tarry stools, nosebleeds -signs of decreased red blood cells - unusually weak or tired, fainting spells, lightheadedness -breathing problems -changes in hearing -changes in vision -chest pain -high  blood pressure -low blood counts - This drug may decrease the number of white blood cells, red blood cells and platelets. You may be at increased risk for infections and bleeding. -nausea and vomiting -pain, swelling, redness or irritation at the injection site -pain, tingling, numbness in the hands or feet -problems with balance, talking, walking -trouble passing urine or change in the amount of urine Side effects that usually do not require medical attention (report to your doctor or health care professional if they continue or are bothersome): -hair loss -loss of appetite -metallic taste in the mouth or changes in taste This list may not describe all possible side effects. Call your doctor for medical advice about side effects. You may report side effects to FDA at 1-800-FDA-1088. Where should I keep my medicine? This drug is given in a hospital or clinic and will not be stored at home. NOTE: This sheet is a summary. It may not cover all possible information. If you have questions about this medicine, talk to your doctor, pharmacist, or health care provider.    2016, Elsevier/Gold Standard. (2007-03-28 14:38:05) Pegfilgrastim injection What is this medicine? PEGFILGRASTIM (PEG fil gra stim) is a long-acting granulocyte colony-stimulating factor that stimulates the growth of neutrophils, a type of white blood cell important in the body's fight against infection. It is used to reduce the incidence of fever and infection in patients with certain types of cancer who are receiving chemotherapy that affects the bone marrow, and to increase survival after being exposed to high doses of radiation. This medicine may be used for other purposes; ask your health care provider or pharmacist if you have questions. What should I tell my health care provider before I take this medicine? They need to know if you have any of these conditions: -kidney disease -latex allergy -ongoing radiation  therapy -sickle cell disease -skin reactions to acrylic adhesives (On-Body Injector only) -an unusual or allergic reaction to pegfilgrastim, filgrastim, other medicines, foods, dyes, or preservatives -pregnant or trying to get pregnant -breast-feeding How should I use this medicine? This medicine is for injection under the skin. If you get this medicine at home, you will be taught how to prepare and give the pre-filled syringe or how to use the On-body Injector. Refer to the patient Instructions for Use for detailed instructions. Use exactly as directed. Take your medicine at regular intervals. Do not take your medicine more often than directed. It is important that you put your used needles and syringes in a special sharps container. Do not put them in a trash can. If you do not have a sharps container, call your pharmacist or healthcare provider to get one. Talk to your pediatrician regarding the use of this medicine in children. While this drug  may be prescribed for selected conditions, precautions do apply. Overdosage: If you think you have taken too much of this medicine contact a poison control center or emergency room at once. NOTE: This medicine is only for you. Do not share this medicine with others. What if I miss a dose? It is important not to miss your dose. Call your doctor or health care professional if you miss your dose. If you miss a dose due to an On-body Injector failure or leakage, a new dose should be administered as soon as possible using a single prefilled syringe for manual use. What may interact with this medicine? Interactions have not been studied. Give your health care provider a list of all the medicines, herbs, non-prescription drugs, or dietary supplements you use. Also tell them if you smoke, drink alcohol, or use illegal drugs. Some items may interact with your medicine. This list may not describe all possible interactions. Give your health care provider a list of  all the medicines, herbs, non-prescription drugs, or dietary supplements you use. Also tell them if you smoke, drink alcohol, or use illegal drugs. Some items may interact with your medicine. What should I watch for while using this medicine? You may need blood work done while you are taking this medicine. If you are going to need a MRI, CT scan, or other procedure, tell your doctor that you are using this medicine (On-Body Injector only). What side effects may I notice from receiving this medicine? Side effects that you should report to your doctor or health care professional as soon as possible: -allergic reactions like skin rash, itching or hives, swelling of the face, lips, or tongue -dizziness -fever -pain, redness, or irritation at site where injected -pinpoint red spots on the skin -red or dark-brown urine -shortness of breath or breathing problems -stomach or side pain, or pain at the shoulder -swelling -tiredness -trouble passing urine or change in the amount of urine Side effects that usually do not require medical attention (report to your doctor or health care professional if they continue or are bothersome): -bone pain -muscle pain This list may not describe all possible side effects. Call your doctor for medical advice about side effects. You may report side effects to FDA at 1-800-FDA-1088. Where should I keep my medicine? Keep out of the reach of children. Store pre-filled syringes in a refrigerator between 2 and 8 degrees C (36 and 46 degrees F). Do not freeze. Keep in carton to protect from light. Throw away this medicine if it is left out of the refrigerator for more than 48 hours. Throw away any unused medicine after the expiration date. NOTE: This sheet is a summary. It may not cover all possible information. If you have questions about this medicine, talk to your doctor, pharmacist, or health care provider.    2016, Elsevier/Gold Standard. (2014-01-10  14:30:14)   WATCH YOUR DAILY INTAKE OF POTASSIUM FOR THE TIME BEING BY LIMITING SOME OF THE FOODS LISTED BELOW WITH THE HIGHEST POTASSIUM CONTENT.  Potassium Content of Foods Potassium is a mineral found in many foods and drinks. It helps keep fluids and minerals balanced in your body and affects how steadily your heart beats. Potassium also helps control your blood pressure and keep your muscles and nervous system healthy. Certain health conditions and medicines may change the balance of potassium in your body. When this happens, you can help balance your level of potassium through the foods that you do or do not eat. Your health  care provider or dietitian may recommend an amount of potassium that you should have each day. The following lists of foods provide the amount of potassium (in parentheses) per serving in each item. HIGH IN POTASSIUM  The following foods and beverages have 200 mg or more of potassium per serving:  Apricots, 2 raw or 5 dry (200 mg).  Artichoke, 1 medium (345 mg).  Avocado, raw,  each (245 mg).  Banana, 1 medium (425 mg).  Beans, lima, or baked beans, canned,  cup (280 mg).  Beans, white, canned,  cup (595 mg).  Beef roast, 3 oz (320 mg).  Beef, ground, 3 oz (270 mg).  Beets, raw or cooked,  cup (260 mg).  Bran muffin, 2 oz (300 mg).  Broccoli,  cup (230 mg).  Brussels sprouts,  cup (250 mg).  Cantaloupe,  cup (215 mg).  Cereal, 100% bran,  cup (200-400 mg).  Cheeseburger, single, fast food, 1 each (225-400 mg).  Chicken, 3 oz (220 mg).  Clams, canned, 3 oz (535 mg).  Crab, 3 oz (225 mg).  Dates, 5 each (270 mg).  Dried beans and peas,  cup (300-475 mg).  Figs, dried, 2 each (260 mg).  Fish: halibut, tuna, cod, snapper, 3 oz (480 mg).  Fish: salmon, haddock, swordfish, perch, 3 oz (300 mg).  Fish, tuna, canned 3 oz (200 mg).  [place] fries, fast food, 3 oz (470 mg).  Granola with fruit and nuts,  cup (200  mg).  Grapefruit juice,  cup (200 mg).  Greens, beet,  cup (655 mg).  Honeydew melon,  cup (200 mg).  Kale, raw, 1 cup (300 mg).  Kiwi, 1 medium (240 mg).  Kohlrabi, rutabaga, parsnips,  cup (280 mg).  Lentils,  cup (365 mg).  Mango, 1 each (325 mg).  Milk, chocolate, 1 cup (420 mg).  Milk: nonfat, low-fat, whole, buttermilk, 1 cup (350-380 mg).  Molasses, 1 Tbsp (295 mg).  Mushrooms,  cup (280) mg.  Nectarine, 1 each (275 mg).  Nuts: almonds, peanuts, hazelnuts, [place], cashew, mixed, 1 oz (200 mg).  Nuts, pistachios, 1 oz (295 mg).  Orange, 1 each (240 mg).  Orange juice,  cup (235 mg).  Papaya, medium,  fruit (390 mg).  Peanut butter, chunky, 2 Tbsp (240 mg).  Peanut butter, smooth, 2 Tbsp (210 mg).  Pear, 1 medium (200 mg).  Pomegranate, 1 whole (400 mg).  Pomegranate juice,  cup (215 mg).  Pork, 3 oz (350 mg).  Potato chips, salted, 1 oz (465 mg).  Potato, baked with skin, 1 medium (925 mg).  Potatoes, boiled,  cup (255 mg).  Potatoes, mashed,  cup (330 mg).  Prune juice,  cup (370 mg).  Prunes, 5 each (305 mg).  Pudding, chocolate,  cup (230 mg).  Pumpkin, canned,  cup (250 mg).  Raisins, seedless,  cup (270 mg).  Seeds, sunflower or pumpkin, 1 oz (240 mg).  Soy milk, 1 cup (300 mg).  Spinach,  cup (420 mg).  Spinach, canned,  cup (370 mg).  Sweet potato, baked with skin, 1 medium (450 mg).  Swiss chard,  cup (480 mg).  Tomato or vegetable juice,  cup (275 mg).  Tomato sauce or puree,  cup (400-550 mg).  Tomato, raw, 1 medium (290 mg).  Tomatoes, canned,  cup (200-300 mg).  [place], 3 oz (250 mg).  Wheat germ, 1 oz (250 mg).  Winter squash,  cup (250 mg).  Yogurt, plain or fruited, 6 oz (260-435 mg).  Zucchini,  cup (220 mg).  MODERATE IN POTASSIUM The following foods and beverages have 50-200 mg of potassium per serving:  Apple, 1 each (150 mg).  Apple juice,  cup (150  mg).  Applesauce,  cup (90 mg).  Apricot nectar,  cup (140 mg).  Asparagus, small spears,  cup or 6 spears (155 mg).  Bagel, cinnamon raisin, 1 each (130 mg).  Bagel, egg or plain, 4 in., 1 each (70 mg).  Beans, green,  cup (90 mg).  Beans, yellow,  cup (190 mg).  Beer, regular, 12 oz (100 mg).  Beets, canned,  cup (125 mg).  Blackberries,  cup (115 mg).  Blueberries,  cup (60 mg).  Bread, whole wheat, 1 slice (70 mg).  Broccoli, raw,  cup (145 mg).  Cabbage,  cup (150 mg).  Carrots, cooked or raw,  cup (180 mg).  Cauliflower, raw,  cup (150 mg).  Celery, raw,  cup (155 mg).  Cereal, bran flakes, cup (120-150 mg).  Cheese, cottage,  cup (110 mg).  Cherries, 10 each (150 mg).  Chocolate, 1 oz bar (165 mg).  Coffee, brewed 6 oz (90 mg).  Corn,  cup or 1 ear (195 mg).  Cucumbers,  cup (80 mg).  Egg, large, 1 each (60 mg).  Eggplant,  cup (60 mg).  Endive, raw, cup (80 mg).  English muffin, 1 each (65 mg).  Fish, orange roughy, 3 oz (150 mg).  Frankfurter, beef or pork, 1 each (75 mg).  Fruit cocktail,  cup (115 mg).  Grape juice,  cup (170 mg).  Grapefruit,  fruit (175 mg).  Grapes,  cup (155 mg).  Greens: kale, turnip, collard,  cup (110-150 mg).  Ice cream or frozen yogurt, chocolate,  cup (175 mg).  Ice cream or frozen yogurt, vanilla,  cup (120-150 mg).  Lemons, limes, 1 each (80 mg).  Lettuce, all types, 1 cup (100 mg).  Mixed vegetables,  cup (150 mg).  Mushrooms, raw,  cup (110 mg).  Nuts: walnuts, pecans, or macadamia, 1 oz (125 mg).  Oatmeal,  cup (80 mg).  Okra,  cup (110 mg).  Onions, raw,  cup (120 mg).  Peach, 1 each (185 mg).  Peaches, canned,  cup (120 mg).  Pears, canned,  cup (120 mg).  Peas, green, frozen,  cup (90 mg).  Peppers, green,  cup (130 mg).  Peppers, red,  cup (160 mg).  Pineapple juice,  cup (165 mg).  Pineapple, fresh or canned,  cup (100  mg).  Plums, 1 each (105 mg).  Pudding, vanilla,  cup (150 mg).  Raspberries,  cup (90 mg).  Rhubarb,  cup (115 mg).  Rice, wild,  cup (80 mg).  Shrimp, 3 oz (155 mg).  Spinach, raw, 1 cup (170 mg).  Strawberries,  cup (125 mg).  Summer squash  cup (175-200 mg).  Swiss chard, raw, 1 cup (135 mg).  Tangerines, 1 each (140 mg).  Tea, brewed, 6 oz (65 mg).  Turnips,  cup (140 mg).  Watermelon,  cup (85 mg).  Wine, red, table, 5 oz (180 mg).  Wine, white, table, 5 oz (100 mg). LOW IN POTASSIUM The following foods and beverages have less than 50 mg of potassium per serving.  Bread, white, 1 slice (30 mg).  Carbonated beverages, 12 oz (less than 5 mg).  Cheese, 1 oz (20-30 mg).  Cranberries,  cup (45 mg).  Cranberry juice cocktail,  cup (20 mg).  Fats and oils, 1 Tbsp (less than 5 mg).  Hummus, 1 Tbsp (32 mg).  Nectar:  papaya, mango, or pear,  cup (35 mg).  Rice, white or brown,  cup (50 mg).  Spaghetti or macaroni,  cup cooked (30 mg).  Tortilla, flour or corn, 1 each (50 mg).  Waffle, 4 in., 1 each (50 mg).  Water chestnuts,  cup (40 mg).   This information is not intended to replace advice given to you by your health care provider. Make sure you discuss any questions you have with your health care provider.   Document Released: 08/04/2004 Document Revised: 12/26/2012 Document Reviewed: 11/17/2012 Elsevier Interactive Patient Education [employer].

## 2015-10-31 NOTE — Progress Notes (Signed)
Patient feels well. Dr. Marko Plume aware of Potassium today prior to treatment. She ordered US to proceed and educate patient to decrease her dietary potassium intake. Pt provided with a list of foods to decrease for the time being.

## 2015-11-04 ENCOUNTER — Telehealth: Payer: Self-pay

## 2015-11-04 ENCOUNTER — Other Ambulatory Visit: Payer: Self-pay

## 2015-11-04 MED ORDER — AMOXICILLIN 500 MG PO CAPS: 500.0000 mg | ORAL_CAPSULE | Freq: Two times a day (BID) | ORAL | 0 refills | Status: DC

## 2015-11-04 NOTE — Telephone Encounter (Signed)
Received VM from pt's husband stating she was experiencing some side effects they were concerned about.  Pt received 1st chemo treatment on 10/27.  Called pt back to obtain more details.  Pt reports onset of sinus and ear pain as well as dizziness that started last evening.  Pt reports dizziness is constant and does not change with positioning.  Pt also reports chills and flush feeling that comes and goes.  Pt denies fever and reports tmax of 98.5.  Pt states she has had some mild nausea but no vomiting and started experiencing diarrhea last evening.  Reviewed this information with Dr. Lindi Adie who recommends pt start amoxicillin 500mg  PO BID x 7 days as well as claritin and immodium OTC.  Called pt back to discuss and answered all questions.  Pt verbalized understanding and reports she will try these things and notify us if she has additional side effects or concerns.  Reiterated importance of monitoring temperature and signs of infection and to notify us if she needs additional assistance prior to next appt on 11/3.  No further questions or concerns at time of call.

## 2015-11-06 NOTE — Assessment & Plan Note (Signed)
Left lumpectomy 09/25/2015: IDC with DCIS, 1.2 cm, margins negative, 0/4 lymph nodes negative, grade 3, ER 70%, PR 5%, HER-2 positive ratio 3.45, Ki-67 10%, T1 CN 0 stage IA  Pathology counseling: I discussed the final pathology report of the patient provided  a copy of this report. I discussed the margins as well as lymph node surgeries. We also discussed the final staging along with previously performed ER/PR and HER-2/neu testing.  Treatment plan: 1. Adjuvant chemotherapy with TCH 6 cycles followed by Herceptin every 3 weeks for 1 year 2. Followed by adjuvant radiation 3. Followed by adjuvant antiestrogen therapy ---------------------------------------------------------------------------------------------------------------------------------- Current Treatment: TCH cycle 1 day 1  Antiemetics were reviewed Chemotherapy consent obtained Chemotherapy education completed Echocardiogram 10/06/2015: EF 55-60% Closely monitoring for chemotherapy toxicities. Return to clinic in one week for toxicity check

## 2015-11-07 ENCOUNTER — Other Ambulatory Visit (HOSPITAL_BASED_OUTPATIENT_CLINIC_OR_DEPARTMENT_OTHER): Payer: BLUE CROSS/BLUE SHIELD

## 2015-11-07 ENCOUNTER — Ambulatory Visit (HOSPITAL_BASED_OUTPATIENT_CLINIC_OR_DEPARTMENT_OTHER): Payer: BLUE CROSS/BLUE SHIELD | Admitting: Hematology and Oncology

## 2015-11-07 ENCOUNTER — Encounter: Payer: Self-pay | Admitting: Hematology and Oncology

## 2015-11-07 ENCOUNTER — Ambulatory Visit (HOSPITAL_BASED_OUTPATIENT_CLINIC_OR_DEPARTMENT_OTHER): Payer: BLUE CROSS/BLUE SHIELD | Admitting: Nurse Practitioner

## 2015-11-07 ENCOUNTER — Other Ambulatory Visit: Payer: Self-pay

## 2015-11-07 VITALS — BP 139/90 | HR 105 | Temp 98.1°F | Resp 18 | Wt 158.3 lb

## 2015-11-07 DIAGNOSIS — C50512 Malignant neoplasm of lower-outer quadrant of left female breast: Secondary | ICD-10-CM | POA: Diagnosis not present

## 2015-11-07 DIAGNOSIS — K521 Toxic gastroenteritis and colitis: Secondary | ICD-10-CM

## 2015-11-07 DIAGNOSIS — Z170 Estrogen receptor positive status [ER+]: Secondary | ICD-10-CM

## 2015-11-07 DIAGNOSIS — T451X5A Adverse effect of antineoplastic and immunosuppressive drugs, initial encounter: Principal | ICD-10-CM

## 2015-11-07 DIAGNOSIS — R112 Nausea with vomiting, unspecified: Secondary | ICD-10-CM

## 2015-11-07 DIAGNOSIS — E860 Dehydration: Secondary | ICD-10-CM | POA: Diagnosis not present

## 2015-11-07 DIAGNOSIS — R197 Diarrhea, unspecified: Secondary | ICD-10-CM

## 2015-11-07 LAB — COMPREHENSIVE METABOLIC PANEL
ALBUMIN: 3.6 g/dL (ref 3.5–5.0)
ALK PHOS: 132 U/L (ref 40–150)
ALT: 51 U/L (ref 0–55)
ANION GAP: 13 meq/L — AB (ref 3–11)
AST: 32 U/L (ref 5–34)
BUN: 9.6 mg/dL (ref 7.0–26.0)
CO2: 24 mEq/L (ref 22–29)
Calcium: 9.8 mg/dL (ref 8.4–10.4)
Chloride: 100 mEq/L (ref 98–109)
Creatinine: 0.8 mg/dL (ref 0.6–1.1)
EGFR: 80 mL/min/{1.73_m2} — AB (ref >90)
GLUCOSE: 124 mg/dL (ref 70–140)
POTASSIUM: 4.3 meq/L (ref 3.5–5.1)
SODIUM: 138 meq/L (ref 136–145)
Total Bilirubin: 0.29 mg/dL (ref 0.20–1.20)
Total Protein: 7.8 g/dL (ref 6.4–8.3)

## 2015-11-07 LAB — CBC WITH DIFFERENTIAL/PLATELET
BASO%: 0.2 % (ref 0.0–2.0)
Basophils Absolute: 0 10e3/uL (ref 0.0–0.1)
EOS%: 2.4 % (ref 0.0–7.0)
Eosinophils Absolute: 0.5 10e3/uL (ref 0.0–0.5)
HCT: 45.3 % (ref 34.8–46.6)
HGB: 14.6 g/dL (ref 11.6–15.9)
LYMPH%: 17.2 % (ref 14.0–49.7)
MCH: 28.7 pg (ref 25.1–34.0)
MCHC: 32.2 g/dL (ref 31.5–36.0)
MCV: 89.3 fL (ref 79.5–101.0)
MONO#: 2.7 10e3/uL — ABNORMAL HIGH (ref 0.1–0.9)
MONO%: 11.6 % (ref 0.0–14.0)
NEUT#: 15.8 10e3/uL — ABNORMAL HIGH (ref 1.5–6.5)
NEUT%: 68.6 % (ref 38.4–76.8)
Platelets: 227 10e3/uL (ref 145–400)
RBC: 5.07 10e6/uL (ref 3.70–5.45)
RDW: 12.2 % (ref 11.2–14.5)
WBC: 23.1 10e3/uL — ABNORMAL HIGH (ref 3.9–10.3)
lymph#: 4 10e3/uL — ABNORMAL HIGH (ref 0.9–3.3)

## 2015-11-07 MED ORDER — SODIUM CHLORIDE 0.9 % IV SOLN: Freq: Once | INTRAVENOUS | Status: DC

## 2015-11-07 MED ORDER — ONDANSETRON HCL 4 MG/2ML IJ SOLN
8.0000 mg | Freq: Once | INTRAMUSCULAR | Status: DC
  Administered 2015-11-07: 8 mg via INTRAVENOUS

## 2015-11-07 MED ORDER — LORAZEPAM 2 MG/ML IJ SOLN
1.0000 mg | Freq: Once | INTRAMUSCULAR | Status: DC
  Administered 2015-11-07: 1 mg via INTRAVENOUS

## 2015-11-07 MED ORDER — ONDANSETRON HCL 4 MG/2ML IJ SOLN
INTRAMUSCULAR | Status: AC
  Filled 2015-11-07: qty 4

## 2015-11-07 MED ORDER — ATROPINE SULFATE 1 MG/ML IJ SOLN
0.4000 mg | Freq: Once | INTRAMUSCULAR | Status: AC
  Administered 2015-11-07: 0.4 mg via INTRAVENOUS

## 2015-11-07 MED ORDER — LORAZEPAM 2 MG/ML IJ SOLN
INTRAMUSCULAR | Status: AC
  Filled 2015-11-07: qty 1

## 2015-11-07 MED ORDER — LOPERAMIDE HCL 2 MG PO CAPS
2.0000 mg | ORAL_CAPSULE | Freq: Once | ORAL | Status: DC
  Administered 2015-11-07: 2 mg via ORAL

## 2015-11-07 MED ORDER — ATROPINE SULFATE 1 MG/ML IJ SOLN
INTRAMUSCULAR | Status: AC
  Filled 2015-11-07: qty 1

## 2015-11-07 MED ORDER — LOPERAMIDE HCL 2 MG PO CAPS
4.0000 mg | ORAL_CAPSULE | Freq: Once | ORAL | Status: AC
  Administered 2015-11-07: 4 mg via ORAL

## 2015-11-07 MED ORDER — SODIUM CHLORIDE 0.9 % IV SOLN
INTRAVENOUS | Status: DC
  Administered 2015-11-07: 12:00:00 via INTRAVENOUS

## 2015-11-07 MED ORDER — DICYCLOMINE HCL 20 MG PO TABS: 20.0000 mg | ORAL_TABLET | Freq: Four times a day (QID) | ORAL | 0 refills | Status: DC

## 2015-11-07 NOTE — Progress Notes (Addendum)
Received call from [place] in lab stating pt was needing evaluation.  Arrived to lab to find pt in bathroom with husband outside.  Husband unsure what pt is experiencing.  Entered bathroom as instructed per pt who was found hunched over clutching her belly.  Pt reports she is experiencing severe abdominal cramping and diarrhea.  Verbal order given for pt to receive 2 tablets of imodium while in lab and pt being evaluated by Dr. Lindi Adie following lab work.  Imodium given to pt at 10:45 as ordered and pt able to tolerate blood work.  Pt then evaluated by Dr. Lindi Adie who wishes for pt to receive IVF and meds for nausea, abdominal cramping, and diarrhea.  All orders entered as Dr. Lindi Adie requested.  Ambulated with pt and her husband over to [facility] clinic and report given to [place], [place].  After 2 hours of fluids as well as medications ordered, we will re-evaluate and Dr. Lindi Adie will determine if admission is needed.  Pt and her husband without further questions or concerns at time of discussion.    This was a nursing visit only. No provider saw pt.

## 2015-11-07 NOTE — Addendum Note (Signed)
Addended by: Margaret Pyle on: 11/07/2015 11:25 AM   Modules accepted: Orders

## 2015-11-07 NOTE — Patient Instructions (Signed)
Dehydration, Adult Dehydration is a condition in which you do not have enough fluid or water in your body. It happens when you take in less fluid than you lose. Vital organs such as the kidneys, brain, and heart cannot function without a proper amount of fluids. Any loss of fluids from the body can cause dehydration.  Dehydration can range from mild to severe. This condition should be treated right away to help prevent it from becoming severe. CAUSES  This condition may be caused by:  Vomiting.  Diarrhea.  Excessive sweating, such as when exercising in hot or humid weather.  Not drinking enough fluid during strenuous exercise or during an illness.  Excessive urine output.  Fever.  Certain medicines. RISK FACTORS This condition is more likely to develop in:  People who are taking certain medicines that cause the body to lose excess fluid (diuretics).   People who have a chronic illness, such as diabetes, that may increase urination.  Older adults.   People who live at high altitudes.   People who participate in endurance sports.  SYMPTOMS  Mild Dehydration  Thirst.  Dry lips.  Slightly dry mouth.  Dry, warm skin. Moderate Dehydration  Very dry mouth.   Muscle cramps.   Dark urine and decreased urine production.   Decreased tear production.   Headache.   Light-headedness, especially when you stand up from a sitting position.  Severe Dehydration  Changes in skin.   Cold and clammy skin.   Skin does not spring back quickly when lightly pinched and released.   Changes in body fluids.   Extreme thirst.   No tears.   Not able to sweat when body temperature is high, such as in hot weather.   Minimal urine production.   Changes in vital signs.   Rapid, weak pulse (more than 100 beats per minute when you are sitting still).   Rapid breathing.   Low blood pressure.   Other changes.   Sunken eyes.   Cold hands and feet.    Confusion.  Lethargy and difficulty being awakened.  Fainting (syncope).   Short-term weight loss.   Unconsciousness. DIAGNOSIS  This condition may be diagnosed based on your symptoms. You may also have tests to determine how severe your dehydration is. These tests may include:   Urine tests.   Blood tests.  TREATMENT  Treatment for this condition depends on the severity. Mild or moderate dehydration can often be treated at home. Treatment should be started right away. Do not wait until dehydration becomes severe. Severe dehydration needs to be treated at the hospital. Treatment for Mild Dehydration  Drinking plenty of water to replace the fluid you have lost.   Replacing minerals in your blood (electrolytes) that you may have lost.  Treatment for Moderate Dehydration  Consuming oral rehydration solution (ORS). Treatment for Severe Dehydration  Receiving fluid through an IV tube.   Receiving electrolyte solution through a feeding tube that is passed through your nose and into your stomach (nasogastric tube or NG tube).  Correcting any abnormalities in electrolytes. HOME CARE INSTRUCTIONS   Drink enough fluid to keep your urine clear or pale yellow.   Drink water or fluid slowly by taking small sips. You can also try sucking on ice cubes.  Have food or beverages that contain electrolytes. Examples include bananas and sports drinks.  Take over-the-counter and prescription medicines only as told by your health care provider.   Prepare ORS according to the manufacturer's instructions. Take sips  of ORS every 5 minutes until your urine returns to normal.  If you have vomiting or diarrhea, continue to try to drink water, ORS, or both.   If you have diarrhea, avoid:   Beverages that contain caffeine.   Fruit juice.   Milk.   Carbonated soft drinks.  Do not take salt tablets. This can lead to the condition of having too much sodium in your body  (hypernatremia).  SEEK MEDICAL CARE IF:  You cannot eat or drink without vomiting.  You have had moderate diarrhea during a period of more than 24 hours.  You have a fever. SEEK IMMEDIATE MEDICAL CARE IF:   You have extreme thirst.  You have severe diarrhea.  You have not urinated in 6-8 hours, or you have urinated only a small amount of very dark urine.  You have shriveled skin.  You are dizzy, confused, or both.   This information is not intended to replace advice given to you by your health care provider. Make sure you discuss any questions you have with your health care provider.   Document Released: 12/21/2004 Document Revised: 09/11/2014 Document Reviewed: 05/08/2014 Elsevier Interactive Patient Education 2016 [place] have lorazepam/ativan at home to use at night but may use every 8 hours as needed for nausea.  You may also use Zofran/ondansetron  Every 12 hours as needed for nausea.  Take imodium as needed for diarrhea.  Start with 2 caplets & then one after each loose stool up to 8/day.  Take Claritin for the headache/sinus & may also take tylenol as needed.

## 2015-11-07 NOTE — Progress Notes (Signed)
Patient Care Team: Donella Stade, PA-C as PCP - General (Family Medicine) Erroll Luna, MD as Consulting Physician (General Surgery) Nicholas Lose, MD as Consulting Physician (Hematology and Oncology) Gery Pray, MD as Consulting Physician (Radiation Oncology)  DIAGNOSIS:  Encounter Diagnoses  Name Primary?  . Malignant neoplasm of lower-outer quadrant of left breast of female, estrogen receptor positive ([place])   . Diarrhea, unspecified type Yes    SUMMARY OF ONCOLOGIC HISTORY:   Breast cancer of lower-outer quadrant of left female breast ([place])   09/10/2015 Initial Diagnosis    Screening detected left breast asymmetry: 2 adjacent masses 4:00: 8 mm and 3:30: 9 mm; axilla negative, biopsy grade 2-3 IDC, ER 70%, PR 5%, Ki-67 10%, HER-2 positive ratio 3.41, T1BN0 stage IA      09/25/2015 Surgery    Left lumpectomy: IDC with DCIS, 1.2 cm, margins negative, 0/4 lymph nodes negative, grade 3, ER 70%, PR 5%, HER-2 positive ratio 3.45, Ki-67 10%, T1 CN 0 stage IA      10/31/2015 -  Chemotherapy    Adjuvant chemotherapy with TCH 6 cycles followed by Herceptin maintenance for 1 year       CHIEF COMPLIANT: Cycle 1 day 8 TCH  INTERVAL HISTORY: Jenna [name] is a 55 year old with above-mentioned history of left breast cancer underwent lumpectomy and received first cycle of chemotherapy last week. She is here today for toxicity check. After the first chemotherapy on 10/31/2015, patient experienced sinus and ear pain accompanied by dizziness. She also had feelings of chills and flushing sensation. She did not have a fever. 2 days ago she started having abdominal cramps nausea vomiting and diarrhea. She did not take nausea medications as prescribed. She did not take any Imodium. This morning when she arrived in our clinic, she went to the bathroom to have multiple episodes of diarrhea. These were accompanied by intense abdominal cramping and pain. She was also having nausea and she threw up in  the bathroom. She feels very weak and tired.  REVIEW OF SYSTEMS:   Constitutional: Denies fevers, chills or abnormal weight loss, severe generalized fatigue Eyes: Denies blurriness of vision, complains of fatigue Ears, nose, mouth, throat, and face: Sinus congestion and postnasal drip Respiratory: Denies cough, dyspnea or wheezes Cardiovascular: Denies palpitation, chest discomfort Gastrointestinal:  Nausea, vomiting, diarrhea, abdominal cramps Skin: Denies abnormal skin rashes Lymphatics: Denies new lymphadenopathy or easy bruising Neurological:Denies numbness, tingling or new weaknesses Behavioral/Psych: Mood is stable, no new changes  Extremities: No lower extremity edema All other systems were reviewed with the patient and are negative.  I have reviewed the past medical history, past surgical history, social history and family history with the patient and they are unchanged from previous note.  ALLERGIES:  is allergic to sulfa antibiotics.  MEDICATIONS:  Current Outpatient Prescriptions  Medication Sig Dispense Refill  . Albuterol Sulfate (PROAIR RESPICLICK) 931 (90 BASE) MCG/ACT AEPB Inhale 2 puffs into the lungs every 6 (six) hours. (Patient not taking: Reported on 10/06/2015) 3 each 3  . amoxicillin (AMOXIL) 500 MG capsule Take 1 capsule (500 mg total) by mouth 2 (two) times daily. 14 capsule 0  . aspirin EC 81 MG tablet Take 81 mg by mouth daily.    Marland Kitchen dexamethasone (DECADRON) 4 MG tablet Take 1 tablet (4 mg total) by mouth 2 (two) times daily. Start the day before Taxotere. Then again the day after chemo for 1 day 30 tablet 1  . DULoxetine (CYMBALTA) 30 MG capsule Take 1 capsule (30 mg  total) by mouth daily. 90 capsule 1  . Lactobacillus (DIGESTIVE HEALTH PROBIOTIC PO) Take by mouth.    . levothyroxine (SYNTHROID, LEVOTHROID) 50 MCG tablet TAKE 1 TABLET (50 MCG TOTAL) BY MOUTH DAILY. 90 tablet 1  . lidocaine-prilocaine (EMLA) cream Apply to affected area once 30 g 3  .  linaclotide (LINZESS) 290 MCG CAPS capsule Take 1 capsule (290 mcg total) by mouth daily. 90 capsule 1  . liothyronine (CYTOMEL) 25 MCG tablet Take 1 tablet (25 mcg total) by mouth daily. 90 tablet 0  . LORazepam (ATIVAN) 0.5 MG tablet Take 1 tablet (0.5 mg total) by mouth at bedtime. 30 tablet 0  . omeprazole (PRILOSEC) 20 MG capsule Take 1 capsule (20 mg total) by mouth 2 (two) times daily before a meal. 180 capsule 4  . ondansetron (ZOFRAN) 8 MG tablet Take 1 tablet (8 mg total) by mouth 2 (two) times daily as needed for refractory nausea / vomiting. Start on day 3 after chemo. 30 tablet 1  . oxyCODONE-acetaminophen (ROXICET) 5-325 MG tablet Take 1-2 tablets by mouth every 4 (four) hours as needed. (Patient not taking: Reported on 10/06/2015) 30 tablet 0  . Oxymetazoline HCl (NASAL SPRAY NA) Place into the nose.    . Polyethylene Glycol 3350 (MIRALAX PO) Take by mouth.    . prochlorperazine (COMPAZINE) 10 MG tablet Take 1 tablet (10 mg total) by mouth every 6 (six) hours as needed (Nausea or vomiting). 30 tablet 1  . Simethicone (GAS-X PO) Take by mouth.    . triamcinolone (NASACORT) 55 MCG/ACT AERO nasal inhaler Place into the nose.     No current facility-administered medications for this visit.    Facility-Administered Medications Ordered in Other Visits  Medication Dose Route Frequency Provider Last Rate Last Dose  . atropine injection 0.4 mg  0.4 mg Intravenous Once Nicholas Lose, MD        PHYSICAL EXAMINATION: ECOG PERFORMANCE STATUS: 1 - Symptomatic but completely ambulatory  Vitals:   11/07/15 1050  BP: 139/90  Pulse: (!) 105  Resp: 18  Temp: 98.1 F (36.7 C)   Filed Weights   11/07/15 1050  Weight: 158 lb 4.8 oz (71.8 kg)    GENERAL:alert, no distress and comfortable SKIN: skin color, texture, turgor are normal, no rashes or significant lesions EYES: normal, Conjunctiva are pink and non-injected, sclera clear OROPHARYNX:no exudate, no erythema and lips, buccal mucosa,  and tongue normal  NECK: supple, thyroid normal size, non-tender, without nodularity LYMPH:  no palpable lymphadenopathy in the cervical, axillary or inguinal LUNGS: clear to auscultation and percussion with normal breathing effort HEART: regular rate & rhythm and no murmurs and no lower extremity edema ABDOMEN: Abdominal pain and tenderness MUSCULOSKELETAL:no cyanosis of digits and no clubbing  NEURO: alert & oriented x 3 with fluent speech, no focal motor/sensory deficits EXTREMITIES: No lower extremity edema  LABORATORY DATA:  I have reviewed the data as listed   Chemistry      Component Value Date/Time   NA 138 11/07/2015 1016   K 4.3 11/07/2015 1016   CL 102 08/12/2015 0959   CO2 24 11/07/2015 1016   BUN 9.6 11/07/2015 1016   CREATININE 0.8 11/07/2015 1016      Component Value Date/Time   CALCIUM 9.8 11/07/2015 1016   ALKPHOS 132 11/07/2015 1016   AST 32 11/07/2015 1016   ALT 51 11/07/2015 1016   BILITOT 0.29 11/07/2015 1016       Lab Results  Component Value Date   WBC  23.1 (H) 11/07/2015   HGB 14.6 11/07/2015   HCT 45.3 11/07/2015   MCV 89.3 11/07/2015   PLT 227 11/07/2015   NEUTROABS 15.8 (H) 11/07/2015     ASSESSMENT & PLAN:  Breast cancer of lower-outer quadrant of left female breast ([place]) Left lumpectomy 09/25/2015: IDC with DCIS, 1.2 cm, margins negative, 0/4 lymph nodes negative, grade 3, ER 70%, PR 5%, HER-2 positive ratio 3.45, Ki-67 10%, T1 CN 0 stage IA  Treatment plan: 1. Adjuvant chemotherapy with TCH 6 cycles followed by Herceptin every 3 weeks for 1 year 2. Followed by adjuvant radiation 3. Followed by adjuvant antiestrogen therapy ---------------------------------------------------------------------------------------------------------------------------------- Current Treatment: TCH cycle 1 day 8 Echocardiogram 10/06/2015: EF 55-60%  Chemotherapy toxicities: 1. Nausea and vomiting grade 2 2. fatigue grade 1 3. Diarrhea grade 2 4. Sinus  drainage and dizziness: Treated with amoxicillin for 1 week 5. Dehydration: She will need IV fluids  Leukocytosis secondary to Neulasta.  We will send her to the infusion room for IV fluids with Zofran 8 mg, Ativan 1 mg, atropine 0.5 mg along with normal saline. If her symptoms improve in a couple of hours, we can discharge her home. With instructions to come back to the hospital if her symptoms relapse. I instructed her that she needs to take Zofran around the clock every 8 hours along with Imodium as recommended. I will also prescribe her Bentyl to be taken at home for abdominal cramps.  Closely monitoring for chemotherapy toxicities. Return to clinic in 2 weeks for cycle 2   No orders of the defined types were placed in this encounter.  The patient has a good understanding of the overall plan. she agrees with it. she will call with any problems that may develop before the next visit here.   Rulon Eisenmenger, MD 11/07/15

## 2015-11-10 ENCOUNTER — Other Ambulatory Visit: Payer: Self-pay

## 2015-11-10 DIAGNOSIS — Z170 Estrogen receptor positive status [ER+]: Principal | ICD-10-CM

## 2015-11-10 DIAGNOSIS — C50512 Malignant neoplasm of lower-outer quadrant of left female breast: Secondary | ICD-10-CM

## 2015-11-10 MED ORDER — LIDOCAINE-PRILOCAINE 2.5-2.5 % EX CREA: TOPICAL_CREAM | CUTANEOUS | 3 refills | Status: DC

## 2015-11-18 ENCOUNTER — Encounter: Payer: Self-pay | Admitting: Nurse Practitioner

## 2015-11-20 NOTE — Assessment & Plan Note (Signed)
negative, grade 3, ER 70%, PR 5%, HER-2 positive ratio 3.45, Ki-67 10%, T1 CN 0 stage IA  Treatment plan: 1. Adjuvant chemotherapy with [place] 6 cyclesfollowed by Herceptin every 3 weeks for 1 year 2. Followed by adjuvant radiation 3. Followed by adjuvant antiestrogen therapy ---------------------------------------------------------------------------------------------------------------------------------- Current Treatment: TCH cycle 2 day 1 Echocardiogram 10/06/2015: EF 55-60%  Chemotherapy toxicities: 1. Nausea and vomiting grade 2 2. fatigue grade 1 3. Diarrhea grade 2 4. Sinus drainage and dizziness: Treated with amoxicillin for 1 week 5. Dehydration: She will need IV fluids  Leukocytosis secondary to Neulasta.  Abd Cramps: Bentyl to be taken at home for abdominal cramps.  Closely monitoring for chemotherapy toxicities. Return to clinic in 3 weeks for cycle 3

## 2015-11-21 ENCOUNTER — Encounter: Payer: Self-pay | Admitting: Hematology and Oncology

## 2015-11-21 ENCOUNTER — Ambulatory Visit (HOSPITAL_BASED_OUTPATIENT_CLINIC_OR_DEPARTMENT_OTHER): Payer: BLUE CROSS/BLUE SHIELD | Admitting: Hematology and Oncology

## 2015-11-21 ENCOUNTER — Other Ambulatory Visit (HOSPITAL_BASED_OUTPATIENT_CLINIC_OR_DEPARTMENT_OTHER): Payer: BLUE CROSS/BLUE SHIELD

## 2015-11-21 ENCOUNTER — Ambulatory Visit (HOSPITAL_BASED_OUTPATIENT_CLINIC_OR_DEPARTMENT_OTHER): Payer: BLUE CROSS/BLUE SHIELD

## 2015-11-21 ENCOUNTER — Encounter: Payer: Self-pay | Admitting: *Deleted

## 2015-11-21 VITALS — BP 137/80 | HR 88 | Temp 98.1°F | Resp 18 | Ht 62.0 in | Wt 164.5 lb

## 2015-11-21 DIAGNOSIS — Z5112 Encounter for antineoplastic immunotherapy: Secondary | ICD-10-CM

## 2015-11-21 DIAGNOSIS — Z170 Estrogen receptor positive status [ER+]: Principal | ICD-10-CM

## 2015-11-21 DIAGNOSIS — C50512 Malignant neoplasm of lower-outer quadrant of left female breast: Secondary | ICD-10-CM

## 2015-11-21 DIAGNOSIS — Z5111 Encounter for antineoplastic chemotherapy: Secondary | ICD-10-CM

## 2015-11-21 DIAGNOSIS — E860 Dehydration: Secondary | ICD-10-CM | POA: Diagnosis not present

## 2015-11-21 DIAGNOSIS — Z5189 Encounter for other specified aftercare: Secondary | ICD-10-CM | POA: Diagnosis not present

## 2015-11-21 DIAGNOSIS — D72829 Elevated white blood cell count, unspecified: Secondary | ICD-10-CM | POA: Diagnosis not present

## 2015-11-21 LAB — COMPREHENSIVE METABOLIC PANEL
ALT: 25 U/L (ref 0–55)
AST: 18 U/L (ref 5–34)
Albumin: 3.4 g/dL — ABNORMAL LOW (ref 3.5–5.0)
Alkaline Phosphatase: 81 U/L (ref 40–150)
Anion Gap: 13 mEq/L — ABNORMAL HIGH (ref 3–11)
BUN: 12.5 mg/dL (ref 7.0–26.0)
CALCIUM: 10 mg/dL (ref 8.4–10.4)
CHLORIDE: 106 meq/L (ref 98–109)
CO2: 25 mEq/L (ref 22–29)
Creatinine: 0.7 mg/dL (ref 0.6–1.1)
Glucose: 110 mg/dl (ref 70–140)
POTASSIUM: 4.7 meq/L (ref 3.5–5.1)
Sodium: 144 mEq/L (ref 136–145)
Total Bilirubin: 0.38 mg/dL (ref 0.20–1.20)
Total Protein: 7.3 g/dL (ref 6.4–8.3)

## 2015-11-21 LAB — CBC WITH DIFFERENTIAL/PLATELET
BASO%: 0.1 % (ref 0.0–2.0)
BASOS ABS: 0 10*3/uL (ref 0.0–0.1)
EOS%: 0 % (ref 0.0–7.0)
Eosinophils Absolute: 0 10*3/uL (ref 0.0–0.5)
HEMATOCRIT: 36.3 % (ref 34.8–46.6)
HGB: 11.9 g/dL (ref 11.6–15.9)
LYMPH%: 9 % — ABNORMAL LOW (ref 14.0–49.7)
MCH: 29.4 pg (ref 25.1–34.0)
MCHC: 32.8 g/dL (ref 31.5–36.0)
MCV: 89.5 fL (ref 79.5–101.0)
MONO#: 0.5 10*3/uL (ref 0.1–0.9)
MONO%: 4.7 % (ref 0.0–14.0)
NEUT#: 9.1 10*3/uL — ABNORMAL HIGH (ref 1.5–6.5)
NEUT%: 86.2 % — AB (ref 38.4–76.8)
Platelets: 440 10*3/uL — ABNORMAL HIGH (ref 145–400)
RBC: 4.05 10*6/uL (ref 3.70–5.45)
RDW: 13.1 % (ref 11.2–14.5)
WBC: 10.6 10*3/uL — ABNORMAL HIGH (ref 3.9–10.3)
lymph#: 1 10*3/uL (ref 0.9–3.3)

## 2015-11-21 MED ORDER — SODIUM CHLORIDE 0.9 % IV SOLN
Freq: Once | INTRAVENOUS | Status: AC
  Administered 2015-11-21: 10:00:00 via INTRAVENOUS

## 2015-11-21 MED ORDER — PALONOSETRON HCL INJECTION 0.25 MG/5ML
0.2500 mg | Freq: Once | INTRAVENOUS | Status: AC
  Administered 2015-11-21: 0.25 mg via INTRAVENOUS

## 2015-11-21 MED ORDER — SODIUM CHLORIDE 0.9 % IV SOLN
604.0000 mg | Freq: Once | INTRAVENOUS | Status: AC
  Administered 2015-11-21: 600 mg via INTRAVENOUS
  Filled 2015-11-21: qty 60

## 2015-11-21 MED ORDER — SODIUM CHLORIDE 0.9 % IV SOLN
Freq: Once | INTRAVENOUS | Status: AC
  Administered 2015-11-21: 10:00:00 via INTRAVENOUS
  Filled 2015-11-21: qty 5

## 2015-11-21 MED ORDER — DIPHENHYDRAMINE HCL 25 MG PO CAPS
ORAL_CAPSULE | ORAL | Status: AC
  Filled 2015-11-21: qty 2

## 2015-11-21 MED ORDER — ACETAMINOPHEN 325 MG PO TABS
ORAL_TABLET | ORAL | Status: AC
  Filled 2015-11-21: qty 2

## 2015-11-21 MED ORDER — PALONOSETRON HCL INJECTION 0.25 MG/5ML
INTRAVENOUS | Status: AC
  Filled 2015-11-21: qty 5

## 2015-11-21 MED ORDER — DOCETAXEL CHEMO INJECTION 160 MG/16ML
75.0000 mg/m2 | Freq: Once | INTRAVENOUS | Status: AC
  Administered 2015-11-21: 140 mg via INTRAVENOUS
  Filled 2015-11-21: qty 14

## 2015-11-21 MED ORDER — SODIUM CHLORIDE 0.9% FLUSH
10.0000 mL | INTRAVENOUS | Status: DC | PRN
  Administered 2015-11-21: 10 mL
  Filled 2015-11-21: qty 10

## 2015-11-21 MED ORDER — PEGFILGRASTIM 6 MG/0.6ML ~~LOC~~ PSKT
6.0000 mg | PREFILLED_SYRINGE | Freq: Once | SUBCUTANEOUS | Status: AC
  Administered 2015-11-21: 6 mg via SUBCUTANEOUS
  Filled 2015-11-21: qty 0.6

## 2015-11-21 MED ORDER — HEPARIN SOD (PORK) LOCK FLUSH 100 UNIT/ML IV SOLN
500.0000 [IU] | Freq: Once | INTRAVENOUS | Status: AC | PRN
  Administered 2015-11-21: 500 [IU]
  Filled 2015-11-21: qty 5

## 2015-11-21 MED ORDER — ACETAMINOPHEN 325 MG PO TABS
650.0000 mg | ORAL_TABLET | Freq: Once | ORAL | Status: AC
  Administered 2015-11-21: 650 mg via ORAL

## 2015-11-21 MED ORDER — DIPHENHYDRAMINE HCL 25 MG PO CAPS
50.0000 mg | ORAL_CAPSULE | Freq: Once | ORAL | Status: AC
  Administered 2015-11-21: 50 mg via ORAL

## 2015-11-21 MED ORDER — TRASTUZUMAB CHEMO 150 MG IV SOLR
6.0000 mg/kg | Freq: Once | INTRAVENOUS | Status: AC
  Administered 2015-11-21: 441 mg via INTRAVENOUS
  Filled 2015-11-21: qty 21

## 2015-11-21 NOTE — Progress Notes (Signed)
Patient states that she forgot to mention to Dr. Lindi Adie this AM about her continued vertigo.  She states that it is unchanged, but would like to know if there is any medication she should be taking for it.  Dr. Lindi Adie notified and states that he will discuss vertigo with patient.  Patient notified of MD instructions.

## 2015-11-21 NOTE — Progress Notes (Signed)
Patient Care Team: Donella Stade, PA-C as PCP - General (Family Medicine) Erroll Luna, MD as Consulting Physician (General Surgery) Nicholas Lose, MD as Consulting Physician (Hematology and Oncology) Gery Pray, MD as Consulting Physician (Radiation Oncology)  DIAGNOSIS:  Encounter Diagnosis  Name Primary?  . Malignant neoplasm of lower-outer quadrant of left breast of female, estrogen receptor positive ([place]) Yes    SUMMARY OF ONCOLOGIC HISTORY:   Breast cancer of lower-outer quadrant of left female breast ([place])   09/10/2015 Initial Diagnosis    Screening detected left breast asymmetry: 2 adjacent masses 4:00: 8 mm and 3:30: 9 mm; axilla negative, biopsy grade 2-3 IDC, ER 70%, PR 5%, Ki-67 10%, HER-2 positive ratio 3.41, T1BN0 stage IA      09/25/2015 Surgery    Left lumpectomy: IDC with DCIS, 1.2 cm, margins negative, 0/4 lymph nodes negative, grade 3, ER 70%, PR 5%, HER-2 positive ratio 3.45, Ki-67 10%, T1 CN 0 stage IA      10/31/2015 -  Chemotherapy    Adjuvant chemotherapy with TCH 6 cycles followed by Herceptin maintenance for 1 year       CHIEF COMPLIANT: Cycle 2 TCH  INTERVAL HISTORY: Jenna [name] is a 55 year old with above-mentioned history left breast cancer currently on adjuvant chemotherapy and today's cycle 2 of TCH. After cycle 1 she had abdominal cramps nausea and diarrhea for 1 day. This is all resolved. She stopped taking Linzess. She does feel fatigued but otherwise she is doing reasonably well. She had abdominal cramps.  She did not have any further nausea and vomiting. She is eating much better.  REVIEW OF SYSTEMS:   Constitutional: Denies fevers, chills or abnormal weight loss Eyes: Denies blurriness of vision Ears, nose, mouth, throat, and face: Denies mucositis or sore throat Respiratory: Denies cough, dyspnea or wheezes Cardiovascular: Denies palpitation, chest discomfort Gastrointestinal:  No the patient is constipated and still has tender  abdomen Skin: Denies abnormal skin rashes Lymphatics: Denies new lymphadenopathy or easy bruising Neurological:Denies numbness, tingling or new weaknesses Behavioral/Psych: Mood is stable, no new changes  Extremities: No lower extremity edema  All other systems were reviewed with the patient and are negative.  I have reviewed the past medical history, past surgical history, social history and family history with the patient and they are unchanged from previous note.  ALLERGIES:  is allergic to sulfa antibiotics.  MEDICATIONS:  Current Outpatient Prescriptions  Medication Sig Dispense Refill  . Albuterol Sulfate (PROAIR RESPICLICK) 045 (90 BASE) MCG/ACT AEPB Inhale 2 puffs into the lungs every 6 (six) hours. (Patient not taking: Reported on 10/06/2015) 3 each 3  . amoxicillin (AMOXIL) 500 MG capsule Take 1 capsule (500 mg total) by mouth 2 (two) times daily. 14 capsule 0  . aspirin EC 81 MG tablet Take 81 mg by mouth daily.    Marland Kitchen dexamethasone (DECADRON) 4 MG tablet Take 1 tablet (4 mg total) by mouth 2 (two) times daily. Start the day before Taxotere. Then again the day after chemo for 1 day 30 tablet 1  . dicyclomine (BENTYL) 20 MG tablet Take 1 tablet (20 mg total) by mouth every 6 (six) hours. As needed for cramps in abdomen 30 tablet 0  . DULoxetine (CYMBALTA) 30 MG capsule Take 1 capsule (30 mg total) by mouth daily. 90 capsule 1  . Lactobacillus (DIGESTIVE HEALTH PROBIOTIC PO) Take by mouth.    . levothyroxine (SYNTHROID, LEVOTHROID) 50 MCG tablet TAKE 1 TABLET (50 MCG TOTAL) BY MOUTH DAILY. 90 tablet 1  .  lidocaine-prilocaine (EMLA) cream Apply to affected area once 30 g 3  . linaclotide (LINZESS) 290 MCG CAPS capsule Take 1 capsule (290 mcg total) by mouth daily. 90 capsule 1  . liothyronine (CYTOMEL) 25 MCG tablet Take 1 tablet (25 mcg total) by mouth daily. 90 tablet 0  . LORazepam (ATIVAN) 0.5 MG tablet Take 1 tablet (0.5 mg total) by mouth at bedtime. 30 tablet 0  . omeprazole  (PRILOSEC) 20 MG capsule Take 1 capsule (20 mg total) by mouth 2 (two) times daily before a meal. 180 capsule 4  . ondansetron (ZOFRAN) 8 MG tablet Take 1 tablet (8 mg total) by mouth 2 (two) times daily as needed for refractory nausea / vomiting. Start on day 3 after chemo. 30 tablet 1  . oxyCODONE-acetaminophen (ROXICET) 5-325 MG tablet Take 1-2 tablets by mouth every 4 (four) hours as needed. (Patient not taking: Reported on 10/06/2015) 30 tablet 0  . Oxymetazoline HCl (NASAL SPRAY NA) Place into the nose.    . Polyethylene Glycol 3350 (MIRALAX PO) Take by mouth.    . prochlorperazine (COMPAZINE) 10 MG tablet Take 1 tablet (10 mg total) by mouth every 6 (six) hours as needed (Nausea or vomiting). 30 tablet 1  . Simethicone (GAS-X PO) Take by mouth.    . triamcinolone (NASACORT) 55 MCG/ACT AERO nasal inhaler Place into the nose.     No current facility-administered medications for this visit.     PHYSICAL EXAMINATION: ECOG PERFORMANCE STATUS: 1 - Symptomatic but completely ambulatory  Vitals:   11/21/15 0829  BP: 137/80  Pulse: 88  Resp: 18  Temp: 98.1 F (36.7 C)   Filed Weights   11/21/15 0829  Weight: 164 lb 8 oz (74.6 kg)    GENERAL:alert, no distress and comfortable SKIN: skin color, texture, turgor are normal, no rashes or significant lesions EYES: normal, Conjunctiva are pink and non-injected, sclera clear OROPHARYNX:no exudate, no erythema and lips, buccal mucosa, and tongue normal  NECK: supple, thyroid normal size, non-tender, without nodularity LYMPH:  no palpable lymphadenopathy in the cervical, axillary or inguinal LUNGS: clear to auscultation and percussion with normal breathing effort HEART: regular rate & rhythm and no murmurs and no lower extremity edema ABDOMENAbdominal tenderness  MUSCULOSKELETAL:no cyanosis of digits and no clubbing  NEURO: alert & oriented x 3 with fluent speech, no focal motor/sensory deficits EXTREMITIES: No lower extremity  edema  LABORATORY DATA:  I have reviewed the data as listed   Chemistry      Component Value Date/Time   NA 138 11/07/2015 1016   K 4.3 11/07/2015 1016   CL 102 08/12/2015 0959   CO2 24 11/07/2015 1016   BUN 9.6 11/07/2015 1016   CREATININE 0.8 11/07/2015 1016      Component Value Date/Time   CALCIUM 9.8 11/07/2015 1016   ALKPHOS 132 11/07/2015 1016   AST 32 11/07/2015 1016   ALT 51 11/07/2015 1016   BILITOT 0.29 11/07/2015 1016       Lab Results  Component Value Date   WBC 10.6 (H) 11/21/2015   HGB 11.9 11/21/2015   HCT 36.3 11/21/2015   MCV 89.5 11/21/2015   PLT 440 (H) 11/21/2015   NEUTROABS 9.1 (H) 11/21/2015     ASSESSMENT & PLAN:  Breast cancer of lower-outer quadrant of left female breast (H[facility]) negative, grade 3, ER 70%, PR 5%, HER-2 positive ratio 3.45, Ki-67 10%, T1 CN 0 stage IA  Treatment plan: 1. Adjuvant chemotherapy with [place] 6 cyclesfollowed by Herceptin every 3  weeks for 1 year 2. Followed by adjuvant radiation 3. Followed by adjuvant antiestrogen therapy ---------------------------------------------------------------------------------------------------------------------------------- Current Treatment: TCH cycle 2 day 1 Echocardiogram 10/06/2015: EF 55-60%  Chemotherapy toxicities: 1. Nausea and vomiting grade 2 2. fatigue grade 1 3. Diarrhea grade 2 4. Sinus drainage and dizziness: Treated with amoxicillin for 1 week 5. Dehydration: She will need IV fluids 6. Bone pain due to Neulasta: Instructed her to take Claritin  Leukocytosis secondary to Neulasta.  Closely monitoring for chemotherapy toxicities. Return to clinic in 3 weeks for cycle 3   No orders of the defined types were placed in this encounter.  The patient has a good understanding of the overall plan. she agrees with it. she will call with any problems that may develop before the next visit here.   Rulon Eisenmenger, MD 11/21/15

## 2015-11-21 NOTE — Patient Instructions (Signed)
[facility] Discharge Instructions for Patients Receiving Chemotherapy  Today you received the following chemotherapy agents Herceptin, Taxotere and Carboplatin.  To help prevent nausea and vomiting after your treatment, we encourage you to take your nausea medication.   If you develop nausea and vomiting that is not controlled by your nausea medication, call the clinic.   BELOW ARE SYMPTOMS THAT SHOULD BE REPORTED IMMEDIATELY:  *FEVER GREATER THAN 100.5 F  *CHILLS WITH OR WITHOUT FEVER  NAUSEA AND VOMITING THAT IS NOT CONTROLLED WITH YOUR NAUSEA MEDICATION  *UNUSUAL SHORTNESS OF BREATH  *UNUSUAL BRUISING OR BLEEDING  TENDERNESS IN MOUTH AND THROAT WITH OR WITHOUT PRESENCE OF ULCERS  *URINARY PROBLEMS  *BOWEL PROBLEMS  UNUSUAL RASH Items with * indicate a potential emergency and should be followed up as soon as possible.  Feel free to call the clinic you have any questions or concerns. The clinic phone number is [phone].  Please show the CHEMO ALERT CARD at check-in to the Emergency Department and triage nurse.   

## 2015-11-22 ENCOUNTER — Encounter: Payer: Self-pay | Admitting: Hematology and Oncology

## 2015-12-05 NOTE — Progress Notes (Addendum)
Location of Breast Cancer: lower-outer quadrant of left breast    Histology per Pathology Report:   09/25/15 Diagnosis 1. Breast, lumpectomy, Left - INVASIVE AND IN SITU DUCTAL CARCINOMA, 1.2 CM. - MARGINS NOT INVOLVED. - INVASIVE CARCINOMA FOCALLY 0.3 CM FROM ANTERIOR MARGIN. - DUCTAL CARCINOMA IN SITU FOCALLY 0.2 CM FROM ANTERIOR MARGIN. - PREVIOUS BIOPSY SITE. 2. Lymph node, sentinel, biopsy, Left axillary - ONE BENIGN LYMPH NODE (0/1). 3. Lymph node, sentinel, biopsy - ONE BENIGN LYMPH NODE (0/1). 4. Lymph node, sentinel, biopsy - ONE BENIGN LYMPH NODE (0/1). 5. Lymph node, sentinel, biopsy - ONE BENIGN LYMPH NODE (0/1).  09/10/15 Diagnosis Breast, left, needle core biopsy, 3:30 o'clock INVASIVE DUCTAL CARCINOMA, GRADE 3  Receptor Status: ER(70%), PR (10%), Her2-neu (positive), Ki-(10%)  Did patient present with symptoms (if so, please note symptoms) or was this found on screening mammography?: routine screening   Past/Anticipated interventions by surgeon, if any: Procedure: 09/25/15 BREAST LUMPECTOMY WITH NEEDLE LOCALIZATION X'S 2 AND AXILLARY SENTINEL LYMPH NODE BX;  Surgeon: Erroll Luna, MD  Past/Anticipated interventions by medical oncology, if any: Adjuvant chemotherapy with [place] 6 cycles followed by Herceptin maintenance for 1 year  Lymphedema issues, if any: NO  Pain issues, if any:  no  SAFETY ISSUES:  Prior radiation? NO  Pacemaker/ICD? NO Possible current pregnancy? NO Is the patient on methotrexate? NO  Current Complaints / other details: Married,   HX Anxiety, Depression, difficulty sleeping,  menarache age  67 ,  G2P2 no HRT, oral contraceptives, former smoker, 1 pack a week for in college for 10-12 years, quit, no alcohol , no drug use, or smokeless tobacco HX Uterine ablation 21 years ago    BP 124/73 (BP Location: Right Arm, Patient Position: Sitting, Cuff Size: Normal)   Pulse 79   Temp 98.5 F (36.9 C) (Oral)   Resp 16   Ht '5\' 2"'$  (1.575 m)    Wt 166 lb 9.6 oz (75.6 kg)   BMI 30.47 kg/m  Jacqulyn Liner, RN 12/05/2015,10:51 AM

## 2015-12-10 ENCOUNTER — Ambulatory Visit
Admission: RE | Admit: 2015-12-10 | Discharge: 2015-12-10 | Disposition: A | Discharge status: Home / Self Care | Payer: BLUE CROSS/BLUE SHIELD | Source: Ambulatory Visit | Attending: Radiation Oncology | Admitting: Radiation Oncology

## 2015-12-10 ENCOUNTER — Encounter: Payer: Self-pay | Admitting: Radiation Oncology

## 2015-12-10 VITALS — BP 124/73 | HR 79 | Temp 98.5°F | Resp 16 | Ht 62.0 in | Wt 166.6 lb

## 2015-12-10 DIAGNOSIS — Z510 Encounter for antineoplastic radiation therapy: Secondary | ICD-10-CM | POA: Diagnosis not present

## 2015-12-10 DIAGNOSIS — Z87891 Personal history of nicotine dependence: Secondary | ICD-10-CM | POA: Diagnosis not present

## 2015-12-10 DIAGNOSIS — K589 Irritable bowel syndrome without diarrhea: Secondary | ICD-10-CM | POA: Diagnosis not present

## 2015-12-10 DIAGNOSIS — E063 Autoimmune thyroiditis: Secondary | ICD-10-CM | POA: Insufficient documentation

## 2015-12-10 DIAGNOSIS — J45909 Unspecified asthma, uncomplicated: Secondary | ICD-10-CM | POA: Insufficient documentation

## 2015-12-10 DIAGNOSIS — F419 Anxiety disorder, unspecified: Secondary | ICD-10-CM | POA: Diagnosis not present

## 2015-12-10 DIAGNOSIS — Z170 Estrogen receptor positive status [ER+]: Secondary | ICD-10-CM | POA: Diagnosis not present

## 2015-12-10 DIAGNOSIS — C50912 Malignant neoplasm of unspecified site of left female breast: Secondary | ICD-10-CM | POA: Diagnosis not present

## 2015-12-10 DIAGNOSIS — K219 Gastro-esophageal reflux disease without esophagitis: Secondary | ICD-10-CM | POA: Diagnosis not present

## 2015-12-10 DIAGNOSIS — C50512 Malignant neoplasm of lower-outer quadrant of left female breast: Secondary | ICD-10-CM

## 2015-12-10 DIAGNOSIS — Z853 Personal history of malignant neoplasm of breast: Secondary | ICD-10-CM | POA: Insufficient documentation

## 2015-12-10 DIAGNOSIS — Z9889 Other specified postprocedural states: Secondary | ICD-10-CM | POA: Diagnosis not present

## 2015-12-10 NOTE — Progress Notes (Signed)
Please see the Nurse Progress Note in the MD Initial Consult Encounter for this patient. 

## 2015-12-10 NOTE — Progress Notes (Signed)
Radiation Oncology         [phone] ________________________________  Re-Evaluation Note  Name: Jenna [name] MRN: [id number]  Date: 12/10/2015  DOB: 1960-11-06  WS:[facility] [place], PA-C  Nicholas Lose, MD   REFERRING PHYSICIAN: Nicholas Lose, MD  DIAGNOSIS: Stage IA (pT1c, pN0) grade 3 invasive ductal carcinoma of the left breast (triple positive)  HISTORY OF PRESENT ILLNESS::Jenna [name] is a 55 y.o. female who presented to multidisciplinary breast clinic on 09/05/15.  The patient had a screening mammogram on 08/22/15 revealing a possible asymmetry in the left breast.  Diagnostic left breast mammogram on 09/09/15 showed an irregular mass in the lower outer quadrant measuring 2.0 x 0.8 cm with associated distortion. On physical exam, no abnormality was palpated. US showed two adjacent irregular hypoechoic masses with mild acoustic shadowing. The first is in the 4:00 position 7 cm from the nipple measuring 0.8 x 0.6 x 0.5 cm. The second is in the 3:30 position 6 cm from the nipple measuring 0.9 x 0.6 x 0.7 cm with internal vascularity. The axilla was negative for adenopathy.  Biopsy of the mass at the 3:30 position on 09/10/15 revealed grade 3 IDC (ER 70% +, PR 5% +, HER2 +, Ki67 10%).  The patient had surgery on 09/25/15 consisting of a left lumpectomy and sentinel lymph node biopsy. This revealed grade 3 invasive and high grade in situ ductal carcinoma spanning 1.2 cm with clear margins. 0/4 left axillary sentinel lymph nodes were negative.  The patient started [facility] x 6 cycles followed by 1 year of Herceptin maintenance on 10/31/15 under the care of Dr. Lindi Adie. The patient had her 2nd cycle of chemotherapy on 11/21/15.  The patient presents today to discuss the role of radiation for the management of her disease.    Breast cancer of lower-outer quadrant of left female breast ([place])   09/10/2015 Initial Diagnosis    Screening detected left breast asymmetry: 2 adjacent masses 4:00: 8 mm and  3:30: 9 mm; axilla negative, biopsy grade 2-3 IDC, ER 70%, PR 5%, Ki-67 10%, HER-2 positive ratio 3.41, T1BN0 stage IA      09/25/2015 Surgery    Left lumpectomy: IDC with DCIS, 1.2 cm, margins negative, 0/4 lymph nodes negative, grade 3, ER 70%, PR 5%, HER-2 positive ratio 3.45, Ki-67 10%, T1 CN 0 stage IA      10/31/2015 -  Chemotherapy    Adjuvant chemotherapy with TCH 6 cycles followed by Herceptin maintenance for 1 year       PREVIOUS RADIATION THERAPY: No  PAST MEDICAL HISTORY:  has a past medical history of Anxiety; Asthma; Breast cancer of lower-outer quadrant of left female breast ([place]) (09/12/2015); Complication of anesthesia; GERD (gastroesophageal reflux disease); Hashimoto's disease; Hypothyroidism; Irritable bowel; and Uterine displacement.    PAST SURGICAL HISTORY: Past Surgical History:  Procedure Laterality Date  . BREAST LUMPECTOMY WITH NEEDLE LOCALIZATION AND AXILLARY SENTINEL LYMPH NODE BX Left 09/25/2015   Procedure: BREAST LUMPECTOMY WITH NEEDLE LOCALIZATION X'S 2 AND AXILLARY SENTINEL LYMPH NODE BX;  Surgeon: Erroll Luna, MD;  Location: [place];  Service: General;  Laterality: Left;  . CHOLECYSTECTOMY OPEN    . disk fusion    . ENDOMETRIAL ABLATION    . neck fusion    . PORTACATH PLACEMENT Right 09/25/2015   Procedure: INSERTION PORT-A-CATH;  Surgeon: Erroll Luna, MD;  Location: [place];  Service: General;  Laterality: Right;    FAMILY HISTORY: family history includes Cancer in her maternal grandfather; Diabetes  in her mother; Hyperlipidemia in her mother; Hypertension in her mother; Kidney disease in her mother.  SOCIAL HISTORY:  reports that she has quit smoking. She has never used smokeless tobacco. She reports that she drinks about 0.6 oz of alcohol per week . She reports that she does not use drugs.  ALLERGIES: Sulfa antibiotics  MEDICATIONS:  Current Outpatient Prescriptions  Medication Sig Dispense Refill  .  dexamethasone (DECADRON) 4 MG tablet Take 1 tablet (4 mg total) by mouth 2 (two) times daily. Start the day before Taxotere. Then again the day after chemo for 1 day 30 tablet 1  . DULoxetine (CYMBALTA) 30 MG capsule Take 1 capsule (30 mg total) by mouth daily. 90 capsule 1  . levothyroxine (SYNTHROID, LEVOTHROID) 50 MCG tablet TAKE 1 TABLET (50 MCG TOTAL) BY MOUTH DAILY. 90 tablet 1  . lidocaine-prilocaine (EMLA) cream Apply to affected area once 30 g 3  . liothyronine (CYTOMEL) 25 MCG tablet Take 1 tablet (25 mcg total) by mouth daily. 90 tablet 0  . omeprazole (PRILOSEC) 20 MG capsule Take 1 capsule (20 mg total) by mouth 2 (two) times daily before a meal. 180 capsule 4  . Albuterol Sulfate (PROAIR RESPICLICK) 163 (90 BASE) MCG/ACT AEPB Inhale 2 puffs into the lungs every 6 (six) hours. (Patient not taking: Reported on 10/06/2015) 3 each 3  . amoxicillin (AMOXIL) 500 MG capsule Take 1 capsule (500 mg total) by mouth 2 (two) times daily. (Patient not taking: Reported on 12/10/2015) 14 capsule 0  . aspirin EC 81 MG tablet Take 81 mg by mouth daily.    Marland Kitchen dicyclomine (BENTYL) 20 MG tablet Take 1 tablet (20 mg total) by mouth every 6 (six) hours. As needed for cramps in abdomen (Patient not taking: Reported on 12/10/2015) 30 tablet 0  . Lactobacillus (DIGESTIVE HEALTH PROBIOTIC PO) Take by mouth.    . linaclotide (LINZESS) 290 MCG CAPS capsule Take 1 capsule (290 mcg total) by mouth daily. (Patient not taking: Reported on 12/10/2015) 90 capsule 1  . LORazepam (ATIVAN) 0.5 MG tablet Take 1 tablet (0.5 mg total) by mouth at bedtime. (Patient not taking: Reported on 12/10/2015) 30 tablet 0  . ondansetron (ZOFRAN) 8 MG tablet Take 1 tablet (8 mg total) by mouth 2 (two) times daily as needed for refractory nausea / vomiting. Start on day 3 after chemo. (Patient not taking: Reported on 12/10/2015) 30 tablet 1  . oxyCODONE-acetaminophen (ROXICET) 5-325 MG tablet Take 1-2 tablets by mouth every 4 (four) hours as  needed. (Patient not taking: Reported on 12/10/2015) 30 tablet 0  . Oxymetazoline HCl (NASAL SPRAY NA) Place into the nose.    . Polyethylene Glycol 3350 (MIRALAX PO) Take by mouth.    . prochlorperazine (COMPAZINE) 10 MG tablet Take 1 tablet (10 mg total) by mouth every 6 (six) hours as needed (Nausea or vomiting). (Patient not taking: Reported on 12/10/2015) 30 tablet 1  . Simethicone (GAS-X PO) Take by mouth.    . triamcinolone (NASACORT) 55 MCG/ACT AERO nasal inhaler Place into the nose.     No current facility-administered medications for this encounter.     REVIEW OF SYSTEMS:  A 15 point review of systems is documented in the electronic medical record. This was obtained by the nursing staff. However, I reviewed this with the patient to discuss relevant findings and make appropriate changes.  Pertinent items noted in HPI and remainder of comprehensive ROS otherwise negative.   The patient denies lymphedema or pain.   PHYSICAL  EXAM:  height is _0  (1.575 m) and weight is 166 lb 9.6 oz (75.6 kg). Her oral temperature is 98.5 F (36.9 C). Her blood pressure is 124/73 and her pulse is 79. Her respiration is 16.   General: Alert and oriented, in no acute distress Heart: Regular in rate and rhythm with no murmurs, rubs, or gallops. Chest: Clear to auscultation bilaterally, with no rhonchi, wheezes, or rales. Breast: Right breast somewhat large and pendulous with no palpable mass or nipple discharge. Left breast large and pendulous, lumpectomy scar in approximately the 4:00 position, well healed, no palpable or visible sign of recurrence.   LABORATORY DATA:  Lab Results  Component Value Date   WBC 10.6 (H) 11/21/2015   HGB 11.9 11/21/2015   HCT 36.3 11/21/2015   MCV 89.5 11/21/2015   PLT 440 (H) 11/21/2015   NEUTROABS 9.1 (H) 11/21/2015   Lab Results  Component Value Date   NA 144 11/21/2015   K 4.7 11/21/2015   CL 102 08/12/2015   CO2 25 11/21/2015   GLUCOSE 110 11/21/2015    CREATININE 0.7 11/21/2015   CALCIUM 10.0 11/21/2015      RADIOGRAPHY: No results found.    IMPRESSION: Stage IA (pT1c, pN0) grade 3 invasive ductal carcinoma of the left breast (triple positive)  The patient would be a good candidate for adjuvant radiation therapy as part of her breast conservation treatment.  I spoke to the patient today regarding her diagnosis and options for treatment. We discussed the equivalence in terms of survival and local failure between mastectomy and breast conservation. We discussed the role of radiation in decreasing local failures in patients who undergo lumpectomy. We discussed the process of simulation and the placement tattoos. We discussed 4-6 weeks of treatment as an outpatient. We discussed the possibility of asymptomatic lung damage. We discussed the low likelihood of secondary malignancies. We discussed the possible side effects including but not limited to skin redness, fatigue, permanent skin darkening, and breast swelling.  We discussed the use of cardiac sparing with deep inspiration breath hold if needed.  PLAN: The patient is tentatively scheduled to complete her 6th cycle of adjuvant chemotherapy on 02/12/16. The patient signed a consent form and a copy was placed in her medical chart. CT simulation has been scheduled on 03/02/16 at 10AM and radiation therapy to begin approximately a week afterwards.  Marland Kitchen   ------------------------------------------------  Blair Promise, PhD, MD  This document serves as a record of services personally performed by Gery Pray, MD. It was created on his behalf by Darcus Austin, a trained medical scribe. The creation of this record is based on the scribe's personal observations and the provider's statements to them. This document has been checked and approved by the attending provider.

## 2015-12-11 ENCOUNTER — Other Ambulatory Visit: Payer: Self-pay | Admitting: Hematology and Oncology

## 2015-12-11 DIAGNOSIS — C50512 Malignant neoplasm of lower-outer quadrant of left female breast: Secondary | ICD-10-CM

## 2015-12-11 DIAGNOSIS — Z170 Estrogen receptor positive status [ER+]: Principal | ICD-10-CM

## 2015-12-11 NOTE — Assessment & Plan Note (Signed)
Left lumpectomy 09/25/2015: IDC with DCIS, 1.2 cm, margins negative, 0/4 lymph nodes negative, grade 3, ER 70%, PR 5%, HER-2 positive ratio 3.45, Ki-67 10%, T1 CN 0 stage IA  Pathology counseling: I discussed the final pathology report of the patient provided  a copy of this report. I discussed the margins as well as lymph node surgeries. We also discussed the final staging along with previously performed ER/PR and HER-2/neu testing.  Treatment plan: 1. Adjuvant chemotherapy with TCH 6 cycles followed by Herceptin every 3 weeks for 1 year 2. Followed by adjuvant radiation 3. Followed by adjuvant antiestrogen therapy ---------------------------------------------------------------------------------------------------------------------------------- Current Treatment: TCH cycle 3 day 1 Echocardiogram 10/06/2015: EF 55-60%  Chemotherapy toxicities: 1. Nauseaand vomiting grade 2 2. fatigue grade 1 3. Diarrhea grade 2 4. Sinus drainage and dizziness: Treated with amoxicillin for 1 week 5. Dehydration: She will need IV fluids 6. Bone pain due to Neulasta: Instructed her to take Claritin  Leukocytosis secondary to Neulasta.  Closely monitoring for chemotherapy toxicities. Return to clinic in 3weeksfor cycle 4

## 2015-12-12 ENCOUNTER — Other Ambulatory Visit (HOSPITAL_BASED_OUTPATIENT_CLINIC_OR_DEPARTMENT_OTHER): Payer: BLUE CROSS/BLUE SHIELD

## 2015-12-12 ENCOUNTER — Ambulatory Visit (HOSPITAL_BASED_OUTPATIENT_CLINIC_OR_DEPARTMENT_OTHER): Payer: BLUE CROSS/BLUE SHIELD

## 2015-12-12 ENCOUNTER — Ambulatory Visit (HOSPITAL_BASED_OUTPATIENT_CLINIC_OR_DEPARTMENT_OTHER): Payer: BLUE CROSS/BLUE SHIELD | Admitting: Hematology and Oncology

## 2015-12-12 ENCOUNTER — Encounter: Payer: Self-pay | Admitting: Hematology and Oncology

## 2015-12-12 ENCOUNTER — Encounter: Payer: Self-pay | Admitting: *Deleted

## 2015-12-12 VITALS — BP 135/72 | HR 100 | Temp 98.0°F | Resp 18 | Wt 165.8 lb

## 2015-12-12 VITALS — HR 84

## 2015-12-12 DIAGNOSIS — D6481 Anemia due to antineoplastic chemotherapy: Secondary | ICD-10-CM

## 2015-12-12 DIAGNOSIS — C50512 Malignant neoplasm of lower-outer quadrant of left female breast: Secondary | ICD-10-CM

## 2015-12-12 DIAGNOSIS — Z170 Estrogen receptor positive status [ER+]: Principal | ICD-10-CM

## 2015-12-12 DIAGNOSIS — E860 Dehydration: Secondary | ICD-10-CM

## 2015-12-12 DIAGNOSIS — F418 Other specified anxiety disorders: Secondary | ICD-10-CM

## 2015-12-12 DIAGNOSIS — Z5112 Encounter for antineoplastic immunotherapy: Secondary | ICD-10-CM | POA: Diagnosis not present

## 2015-12-12 DIAGNOSIS — Z23 Encounter for immunization: Secondary | ICD-10-CM | POA: Diagnosis not present

## 2015-12-12 DIAGNOSIS — F329 Major depressive disorder, single episode, unspecified: Secondary | ICD-10-CM

## 2015-12-12 DIAGNOSIS — Z5111 Encounter for antineoplastic chemotherapy: Secondary | ICD-10-CM | POA: Diagnosis not present

## 2015-12-12 DIAGNOSIS — F419 Anxiety disorder, unspecified: Principal | ICD-10-CM

## 2015-12-12 DIAGNOSIS — T451X5A Adverse effect of antineoplastic and immunosuppressive drugs, initial encounter: Secondary | ICD-10-CM

## 2015-12-12 LAB — CBC WITH DIFFERENTIAL/PLATELET
BASO%: 0.1 % (ref 0.0–2.0)
BASOS ABS: 0 10*3/uL (ref 0.0–0.1)
EOS ABS: 0 10*3/uL (ref 0.0–0.5)
EOS%: 0 % (ref 0.0–7.0)
HCT: 32.1 % — ABNORMAL LOW (ref 34.8–46.6)
HGB: 10.4 g/dL — ABNORMAL LOW (ref 11.6–15.9)
LYMPH%: 12 % — AB (ref 14.0–49.7)
MCH: 29.4 pg (ref 25.1–34.0)
MCHC: 32.3 g/dL (ref 31.5–36.0)
MCV: 90.8 fL (ref 79.5–101.0)
MONO#: 0.4 10*3/uL (ref 0.1–0.9)
MONO%: 5.6 % (ref 0.0–14.0)
NEUT#: 5.5 10*3/uL (ref 1.5–6.5)
NEUT%: 82.3 % — ABNORMAL HIGH (ref 38.4–76.8)
Platelets: 283 10*3/uL (ref 145–400)
RBC: 3.53 10*6/uL — AB (ref 3.70–5.45)
RDW: 15.7 % — ABNORMAL HIGH (ref 11.2–14.5)
WBC: 6.7 10*3/uL (ref 3.9–10.3)
lymph#: 0.8 10*3/uL — ABNORMAL LOW (ref 0.9–3.3)

## 2015-12-12 LAB — COMPREHENSIVE METABOLIC PANEL
ALT: 17 U/L (ref 0–55)
AST: 14 U/L (ref 5–34)
Albumin: 3.3 g/dL — ABNORMAL LOW (ref 3.5–5.0)
Alkaline Phosphatase: 78 U/L (ref 40–150)
Anion Gap: 9 mEq/L (ref 3–11)
BUN: 7.9 mg/dL (ref 7.0–26.0)
CO2: 26 meq/L (ref 22–29)
Calcium: 9.5 mg/dL (ref 8.4–10.4)
Chloride: 108 mEq/L (ref 98–109)
Creatinine: 0.6 mg/dL (ref 0.6–1.1)
GLUCOSE: 111 mg/dL (ref 70–140)
POTASSIUM: 4.6 meq/L (ref 3.5–5.1)
SODIUM: 142 meq/L (ref 136–145)
Total Bilirubin: 0.32 mg/dL (ref 0.20–1.20)
Total Protein: 6.9 g/dL (ref 6.4–8.3)

## 2015-12-12 MED ORDER — ACETAMINOPHEN 325 MG PO TABS
650.0000 mg | ORAL_TABLET | Freq: Once | ORAL | Status: AC
  Administered 2015-12-12: 650 mg via ORAL

## 2015-12-12 MED ORDER — DULOXETINE HCL 60 MG PO CPEP: 60.0000 mg | ORAL_CAPSULE | Freq: Every day | ORAL | 3 refills | Status: DC

## 2015-12-12 MED ORDER — SODIUM CHLORIDE 0.9% FLUSH
10.0000 mL | INTRAVENOUS | Status: DC | PRN
  Administered 2015-12-12: 10 mL
  Filled 2015-12-12: qty 10

## 2015-12-12 MED ORDER — PALONOSETRON HCL INJECTION 0.25 MG/5ML
0.2500 mg | Freq: Once | INTRAVENOUS | Status: AC
  Administered 2015-12-12: 0.25 mg via INTRAVENOUS

## 2015-12-12 MED ORDER — DIPHENHYDRAMINE HCL 25 MG PO CAPS
50.0000 mg | ORAL_CAPSULE | Freq: Once | ORAL | Status: AC
  Administered 2015-12-12: 50 mg via ORAL

## 2015-12-12 MED ORDER — SODIUM CHLORIDE 0.9 % IV SOLN
Freq: Once | INTRAVENOUS | Status: AC
  Administered 2015-12-12: 12:00:00 via INTRAVENOUS
  Filled 2015-12-12: qty 5

## 2015-12-12 MED ORDER — INFLUENZA VAC SPLIT QUAD 0.5 ML IM SUSY
0.5000 mL | PREFILLED_SYRINGE | Freq: Once | INTRAMUSCULAR | Status: AC
  Administered 2015-12-12: 0.5 mL via INTRAMUSCULAR
  Filled 2015-12-12: qty 0.5

## 2015-12-12 MED ORDER — LORAZEPAM 0.5 MG PO TABS: 0.5000 mg | ORAL_TABLET | Freq: Every day | ORAL | 0 refills | Status: DC

## 2015-12-12 MED ORDER — HEPARIN SOD (PORK) LOCK FLUSH 100 UNIT/ML IV SOLN
500.0000 [IU] | Freq: Once | INTRAVENOUS | Status: AC | PRN
  Administered 2015-12-12: 500 [IU]
  Filled 2015-12-12: qty 5

## 2015-12-12 MED ORDER — ACETAMINOPHEN 325 MG PO TABS
ORAL_TABLET | ORAL | Status: AC
  Filled 2015-12-12: qty 2

## 2015-12-12 MED ORDER — PEGFILGRASTIM 6 MG/0.6ML ~~LOC~~ PSKT
6.0000 mg | PREFILLED_SYRINGE | Freq: Once | SUBCUTANEOUS | Status: AC
  Administered 2015-12-12: 6 mg via SUBCUTANEOUS
  Filled 2015-12-12: qty 0.6

## 2015-12-12 MED ORDER — DIPHENHYDRAMINE HCL 25 MG PO CAPS
ORAL_CAPSULE | ORAL | Status: AC
  Filled 2015-12-12: qty 2

## 2015-12-12 MED ORDER — DOCETAXEL CHEMO INJECTION 160 MG/16ML
65.0000 mg/m2 | Freq: Once | INTRAVENOUS | Status: AC
  Administered 2015-12-12: 120 mg via INTRAVENOUS
  Filled 2015-12-12: qty 12

## 2015-12-12 MED ORDER — ONDANSETRON HCL 8 MG PO TABS: 8.0000 mg | ORAL_TABLET | Freq: Two times a day (BID) | ORAL | 1 refills | Status: DC | PRN

## 2015-12-12 MED ORDER — SODIUM CHLORIDE 0.9 % IV SOLN
Freq: Once | INTRAVENOUS | Status: AC
  Administered 2015-12-12: 12:00:00 via INTRAVENOUS

## 2015-12-12 MED ORDER — CARBOPLATIN CHEMO INJECTION 600 MG/60ML
604.0000 mg | Freq: Once | INTRAVENOUS | Status: AC
  Administered 2015-12-12: 600 mg via INTRAVENOUS
  Filled 2015-12-12: qty 60

## 2015-12-12 MED ORDER — PALONOSETRON HCL INJECTION 0.25 MG/5ML
INTRAVENOUS | Status: AC
  Filled 2015-12-12: qty 5

## 2015-12-12 MED ORDER — TRASTUZUMAB CHEMO 150 MG IV SOLR
6.0000 mg/kg | Freq: Once | INTRAVENOUS | Status: AC
  Administered 2015-12-12: 441 mg via INTRAVENOUS
  Filled 2015-12-12: qty 21

## 2015-12-12 NOTE — Progress Notes (Signed)
Patient Care Team: Donella Stade, PA-C as PCP - General (Family Medicine) Erroll Luna, MD as Consulting Physician (General Surgery) Nicholas Lose, MD as Consulting Physician (Hematology and Oncology) Gery Pray, MD as Consulting Physician (Radiation Oncology)  DIAGNOSIS:  Encounter Diagnosis  Name Primary?  . Malignant neoplasm of lower-outer quadrant of left breast of female, estrogen receptor positive ([facility])     SUMMARY OF ONCOLOGIC HISTORY:   Breast cancer of lower-outer quadrant of left female breast ([place])   09/10/2015 Initial Diagnosis    Screening detected left breast asymmetry: 2 adjacent masses 4:00: 8 mm and 3:30: 9 mm; axilla negative, biopsy grade 2-3 IDC, ER 70%, PR 5%, Ki-67 10%, HER-2 positive ratio 3.41, T1BN0 stage IA      09/25/2015 Surgery    Left lumpectomy: IDC with DCIS, 1.2 cm, margins negative, 0/4 lymph nodes negative, grade 3, ER 70%, PR 5%, HER-2 positive ratio 3.45, Ki-67 10%, T1 CN 0 stage IA      10/31/2015 -  Chemotherapy    Adjuvant chemotherapy with TCH 6 cycles followed by Herceptin maintenance for 1 year      CHIEF COMPLIANT: Cycle 3 TCH  INTERVAL HISTORY: Jenna [name] is a 55 year old with above-mentioned history of left breast cancer currently on adjuvant chemotherapy and today's cycle 3 of TCH. She had fatigue, diarrhea, nausea with chemotherapy. These symptoms have improved today. She did not have Neulasta related bone pain. She had some teeth discomfort. She needed refills on Ativan and Zofran. She was having increasing depression symptoms and tearfulness and crying episodes.  REVIEW OF SYSTEMS:   Constitutional: Denies fevers, chills or abnormal weight loss Eyes: Denies blurriness of vision Ears, nose, mouth, throat, and face: Denies mucositis or sore throat Respiratory: Denies cough, dyspnea or wheezes Cardiovascular: Denies palpitation, chest discomfort Gastrointestinal:  Denies nausea, heartburn or change in bowel habits Skin:  Denies abnormal skin rashes Lymphatics: Denies new lymphadenopathy or easy bruising Neurological:Denies numbness, tingling or new weaknesses Behavioral/Psych: Worsening depression Extremities: No lower extremity edema Breast:  denies any pain or lumps or nodules in either breasts All other systems were reviewed with the patient and are negative.  I have reviewed the past medical history, past surgical history, social history and family history with the patient and they are unchanged from previous note.  ALLERGIES:  is allergic to sulfa antibiotics.  MEDICATIONS:  Current Outpatient Prescriptions  Medication Sig Dispense Refill  . Albuterol Sulfate (PROAIR RESPICLICK) 144 (90 BASE) MCG/ACT AEPB Inhale 2 puffs into the lungs every 6 (six) hours. (Patient not taking: Reported on 10/06/2015) 3 each 3  . amoxicillin (AMOXIL) 500 MG capsule Take 1 capsule (500 mg total) by mouth 2 (two) times daily. (Patient not taking: Reported on 12/10/2015) 14 capsule 0  . aspirin EC 81 MG tablet Take 81 mg by mouth daily.    Marland Kitchen dexamethasone (DECADRON) 4 MG tablet Take 1 tablet (4 mg total) by mouth 2 (two) times daily. Start the day before Taxotere. Then again the day after chemo for 1 day 30 tablet 1  . dicyclomine (BENTYL) 20 MG tablet Take 1 tablet (20 mg total) by mouth every 6 (six) hours. As needed for cramps in abdomen (Patient not taking: Reported on 12/10/2015) 30 tablet 0  . DULoxetine (CYMBALTA) 60 MG capsule Take 1 capsule (60 mg total) by mouth daily. 90 capsule 3  . Lactobacillus (DIGESTIVE HEALTH PROBIOTIC PO) Take by mouth.    . levothyroxine (SYNTHROID, LEVOTHROID) 50 MCG tablet TAKE 1 TABLET (50  MCG TOTAL) BY MOUTH DAILY. 90 tablet 1  . lidocaine-prilocaine (EMLA) cream Apply to affected area once 30 g 3  . linaclotide (LINZESS) 290 MCG CAPS capsule Take 1 capsule (290 mcg total) by mouth daily. (Patient not taking: Reported on 12/10/2015) 90 capsule 1  . liothyronine (CYTOMEL) 25 MCG tablet Take  1 tablet (25 mcg total) by mouth daily. 90 tablet 0  . LORazepam (ATIVAN) 0.5 MG tablet Take 1 tablet (0.5 mg total) by mouth at bedtime. 30 tablet 0  . omeprazole (PRILOSEC) 20 MG capsule Take 1 capsule (20 mg total) by mouth 2 (two) times daily before a meal. 180 capsule 4  . ondansetron (ZOFRAN) 8 MG tablet Take 1 tablet (8 mg total) by mouth 2 (two) times daily as needed for refractory nausea / vomiting. Start on day 3 after chemo. 30 tablet 1  . oxyCODONE-acetaminophen (ROXICET) 5-325 MG tablet Take 1-2 tablets by mouth every 4 (four) hours as needed. (Patient not taking: Reported on 12/10/2015) 30 tablet 0  . Oxymetazoline HCl (NASAL SPRAY NA) Place into the nose.    . Polyethylene Glycol 3350 (MIRALAX PO) Take by mouth.    . prochlorperazine (COMPAZINE) 10 MG tablet Take 1 tablet (10 mg total) by mouth every 6 (six) hours as needed (Nausea or vomiting). (Patient not taking: Reported on 12/10/2015) 30 tablet 1  . Simethicone (GAS-X PO) Take by mouth.    . triamcinolone (NASACORT) 55 MCG/ACT AERO nasal inhaler Place into the nose.     No current facility-administered medications for this visit.     PHYSICAL EXAMINATION: ECOG PERFORMANCE STATUS: 1 - Symptomatic but completely ambulatory  Vitals:   12/12/15 0948  BP: 135/72  Pulse: 100  Resp: 18  Temp: 98 F (36.7 C)   Filed Weights   12/12/15 0948  Weight: 165 lb 12.8 oz (75.2 kg)    GENERAL:alert, no distress and comfortable SKIN: skin color, texture, turgor are normal, no rashes or significant lesions EYES: normal, Conjunctiva are pink and non-injected, sclera clear OROPHARYNX:no exudate, no erythema and lips, buccal mucosa, and tongue normal  NECK: supple, thyroid normal size, non-tender, without nodularity LYMPH:  no palpable lymphadenopathy in the cervical, axillary or inguinal LUNGS: clear to auscultation and percussion with normal breathing effort HEART: regular rate & rhythm and no murmurs and no lower extremity  edema ABDOMEN:abdomen soft, non-tender and normal bowel sounds MUSCULOSKELETAL:no cyanosis of digits and no clubbing  NEURO: alert & oriented x 3 with fluent speech, no focal motor/sensory deficits EXTREMITIES: No lower extremity edema  LABORATORY DATA:  I have reviewed the data as listed   Chemistry      Component Value Date/Time   NA 144 11/21/2015 0817   K 4.7 11/21/2015 0817   CL 102 08/12/2015 0959   CO2 25 11/21/2015 0817   BUN 12.5 11/21/2015 0817   CREATININE 0.7 11/21/2015 0817      Component Value Date/Time   CALCIUM 10.0 11/21/2015 0817   ALKPHOS 81 11/21/2015 0817   AST 18 11/21/2015 0817   ALT 25 11/21/2015 0817   BILITOT 0.38 11/21/2015 0817       Lab Results  Component Value Date   WBC 6.7 12/12/2015   HGB 10.4 (L) 12/12/2015   HCT 32.1 (L) 12/12/2015   MCV 90.8 12/12/2015   PLT 283 12/12/2015   NEUTROABS 5.5 12/12/2015    ASSESSMENT & PLAN:  Breast cancer of lower-outer quadrant of left female breast ([place]) Left lumpectomy 09/25/2015: IDC with DCIS, 1.2 cm,  margins negative, 0/4 lymph nodes negative, grade 3, ER 70%, PR 5%, HER-2 positive ratio 3.45, Ki-67 10%, T1 CN 0 stage IA  Pathology counseling: I discussed the final pathology report of the patient provided  a copy of this report. I discussed the margins as well as lymph node surgeries. We also discussed the final staging along with previously performed ER/PR and HER-2/neu testing.  Treatment plan: 1. Adjuvant chemotherapy with TCH 6 cycles followed by Herceptin every 3 weeks for 1 year 2. Followed by adjuvant radiation 3. Followed by adjuvant antiestrogen therapy ---------------------------------------------------------------------------------------------------------------------------------- Current Treatment: TCH cycle 3 day 1 Echocardiogram 10/06/2015: EF 55-60%  Chemotherapy toxicities: 1. Nauseaand vomiting grade 2 2. fatigue grade 2: First week after chemotherapy fatigue is  really 3. Diarrhea grade 2: Improved 4. Sinus drainage and dizziness: Previously treated with amoxicillin for 1 week 5. Dehydration: She previously required IV fluids 6. Bone pain due to Neulasta: Improved with Claritin 7. Chemotherapy-induced anemia: I decreased the dosage of cycle 3 Taxotere to 65 mg/m  Depression: I encouraged her to double the dosage of Cymbalta to 60 mg a day and I sent a new prescription. Patient's primary care physician also suggested that to her. Leukocytosis secondary to Neulasta.  Closely monitoring for chemotherapy toxicities. Return to clinic in 3weeksfor cycle 4   No orders of the defined types were placed in this encounter.  The patient has a good understanding of the overall plan. she agrees with it. she will call with any problems that may develop before the next visit here.   Rulon Eisenmenger, MD 12/12/15

## 2015-12-12 NOTE — Patient Instructions (Addendum)
[facility] Discharge Instructions for Patients Receiving Chemotherapy  Today you received the following chemotherapy agents Herceptin, Taxotere and Carboplatin.  To help prevent nausea and vomiting after your treatment, we encourage you to take your nausea medication.   If you develop nausea and vomiting that is not controlled by your nausea medication, call the clinic.   BELOW ARE SYMPTOMS THAT SHOULD BE REPORTED IMMEDIATELY:  *FEVER GREATER THAN 100.5 F  *CHILLS WITH OR WITHOUT FEVER  NAUSEA AND VOMITING THAT IS NOT CONTROLLED WITH YOUR NAUSEA MEDICATION  *UNUSUAL SHORTNESS OF BREATH  *UNUSUAL BRUISING OR BLEEDING  TENDERNESS IN MOUTH AND THROAT WITH OR WITHOUT PRESENCE OF ULCERS  *URINARY PROBLEMS  *BOWEL PROBLEMS  UNUSUAL RASH Items with * indicate a potential emergency and should be followed up as soon as possible.  Feel free to call the clinic you have any questions or concerns. The clinic phone number is [phone].  Please show the CHEMO ALERT CARD at check-in to the Emergency Department and triage nurse.   

## 2015-12-15 ENCOUNTER — Other Ambulatory Visit: Payer: Self-pay

## 2015-12-15 ENCOUNTER — Other Ambulatory Visit: Payer: Self-pay | Admitting: Physician Assistant

## 2015-12-15 NOTE — Progress Notes (Signed)
Pt emailed regarding request to schedule her echo to f/u for herceptin. Sent in basket msg to [employer] and pending reply for more details. Will call pt when I have more info.

## 2015-12-19 ENCOUNTER — Telehealth (HOSPITAL_COMMUNITY): Payer: Self-pay | Admitting: Vascular Surgery

## 2015-12-19 NOTE — Telephone Encounter (Signed)
Left pt message to make f/u appt w/ echo 

## 2015-12-22 ENCOUNTER — Other Ambulatory Visit: Payer: Self-pay | Admitting: Physician Assistant

## 2015-12-22 DIAGNOSIS — E063 Autoimmune thyroiditis: Secondary | ICD-10-CM

## 2015-12-25 ENCOUNTER — Other Ambulatory Visit: Payer: Self-pay | Admitting: Physician Assistant

## 2015-12-31 ENCOUNTER — Telehealth: Payer: Self-pay | Admitting: Physician Assistant

## 2015-12-31 NOTE — Telephone Encounter (Signed)
I called pt to let her know she is due for a f/u on on her Chemo and she states she will have to call back when she is finished with her Chemo

## 2016-01-01 ENCOUNTER — Encounter: Payer: Self-pay | Admitting: *Deleted

## 2016-01-01 NOTE — Progress Notes (Signed)
[facility] Psychosocial Distress Screening Clinical Social Work  Clinical Social Work was referred by distress screening protocol.  The patient scored a 7 on the Psychosocial Distress Thermometer which indicates severe distress. Clinical Social Worker phoned pt to assess for distress and other psychosocial needs. Pt reports her depression has improved since her medications were changed. She feels her anxiety is manageable currently with her medications and behavioral techniques. CSW reviewed additional options for support through [employer]. Pt reports to "not do well in social situations" so is not interested in support groups currently. She is interested in massages and CSW will bring her vouchers at her chemo tomorrow. Pt feels MD addressed her physical concerns at last appt. CSW to see pt tomorrow in chemo.   ONCBCN DISTRESS SCREENING 12/10/2015  Screening Type Initial Screening  Distress experienced in past week (1-10) 7  Practical problem type   Emotional problem type Depression;Nervousness/Anxiety  Spiritual/Religous concerns type   Information Concerns Type   Physical Problem type Sleep/insomnia;Skin dry/itchy  Physician notified of physical symptoms Yes  Referral to clinical psychology   Referral to clinical social work Yes  Referral to dietition   Referral to financial advocate   Referral to support programs   Referral to palliative care     Clinical Social Worker follow up needed: Yes.    If yes, follow up plan: See above Loren Racer, [place] Worker [place]  [facility] Phone: [phone] Fax: [phone]

## 2016-01-02 ENCOUNTER — Ambulatory Visit (HOSPITAL_BASED_OUTPATIENT_CLINIC_OR_DEPARTMENT_OTHER): Payer: BLUE CROSS/BLUE SHIELD | Admitting: Oncology

## 2016-01-02 ENCOUNTER — Ambulatory Visit (HOSPITAL_BASED_OUTPATIENT_CLINIC_OR_DEPARTMENT_OTHER): Payer: BLUE CROSS/BLUE SHIELD

## 2016-01-02 ENCOUNTER — Other Ambulatory Visit (HOSPITAL_BASED_OUTPATIENT_CLINIC_OR_DEPARTMENT_OTHER): Payer: BLUE CROSS/BLUE SHIELD

## 2016-01-02 ENCOUNTER — Encounter: Payer: Self-pay | Admitting: *Deleted

## 2016-01-02 VITALS — BP 146/75 | HR 85 | Temp 97.8°F | Resp 18 | Ht 62.0 in | Wt 167.0 lb

## 2016-01-02 DIAGNOSIS — Z170 Estrogen receptor positive status [ER+]: Secondary | ICD-10-CM | POA: Diagnosis not present

## 2016-01-02 DIAGNOSIS — Z5111 Encounter for antineoplastic chemotherapy: Secondary | ICD-10-CM

## 2016-01-02 DIAGNOSIS — C50512 Malignant neoplasm of lower-outer quadrant of left female breast: Secondary | ICD-10-CM

## 2016-01-02 DIAGNOSIS — E860 Dehydration: Secondary | ICD-10-CM

## 2016-01-02 DIAGNOSIS — D6481 Anemia due to antineoplastic chemotherapy: Secondary | ICD-10-CM

## 2016-01-02 DIAGNOSIS — Z5112 Encounter for antineoplastic immunotherapy: Secondary | ICD-10-CM

## 2016-01-02 LAB — COMPREHENSIVE METABOLIC PANEL
ALBUMIN: 3.5 g/dL (ref 3.5–5.0)
ALK PHOS: 79 U/L (ref 40–150)
ALT: 28 U/L (ref 0–55)
ANION GAP: 10 meq/L (ref 3–11)
AST: 18 U/L (ref 5–34)
BILIRUBIN TOTAL: 0.35 mg/dL (ref 0.20–1.20)
BUN: 14.4 mg/dL (ref 7.0–26.0)
CALCIUM: 9.6 mg/dL (ref 8.4–10.4)
CHLORIDE: 107 meq/L (ref 98–109)
CO2: 26 mEq/L (ref 22–29)
Creatinine: 0.7 mg/dL (ref 0.6–1.1)
EGFR: >90 mL/min/{1.73_m2} (ref >90)
Glucose: 107 mg/dl (ref 70–140)
Potassium: 4.7 mEq/L (ref 3.5–5.1)
Sodium: 143 mEq/L (ref 136–145)
Total Protein: 6.8 g/dL (ref 6.4–8.3)

## 2016-01-02 LAB — CBC WITH DIFFERENTIAL/PLATELET
BASO%: 0.1 % (ref 0.0–2.0)
BASOS ABS: 0 10*3/uL (ref 0.0–0.1)
EOS%: 0 % (ref 0.0–7.0)
Eosinophils Absolute: 0 10*3/uL (ref 0.0–0.5)
HEMATOCRIT: 30.8 % — AB (ref 34.8–46.6)
HEMOGLOBIN: 10.4 g/dL — AB (ref 11.6–15.9)
LYMPH#: 0.8 10*3/uL — AB (ref 0.9–3.3)
LYMPH%: 10.2 % — ABNORMAL LOW (ref 14.0–49.7)
MCH: 31.7 pg (ref 25.1–34.0)
MCHC: 33.7 g/dL (ref 31.5–36.0)
MCV: 93.9 fL (ref 79.5–101.0)
MONO#: 0.3 10*3/uL (ref 0.1–0.9)
MONO%: 3.5 % (ref 0.0–14.0)
NEUT#: 6.5 10*3/uL (ref 1.5–6.5)
NEUT%: 86.2 % — AB (ref 38.4–76.8)
PLATELETS: 208 10*3/uL (ref 145–400)
RBC: 3.28 10*6/uL — ABNORMAL LOW (ref 3.70–5.45)
RDW: 19.1 % — AB (ref 11.2–14.5)
WBC: 7.5 10*3/uL (ref 3.9–10.3)

## 2016-01-02 MED ORDER — DIPHENHYDRAMINE HCL 25 MG PO CAPS
50.0000 mg | ORAL_CAPSULE | Freq: Once | ORAL | Status: AC
  Administered 2016-01-02: 50 mg via ORAL

## 2016-01-02 MED ORDER — SODIUM CHLORIDE 0.9 % IV SOLN
Freq: Once | INTRAVENOUS | Status: AC
  Administered 2016-01-02: 11:00:00 via INTRAVENOUS
  Filled 2016-01-02: qty 5

## 2016-01-02 MED ORDER — HEPARIN SOD (PORK) LOCK FLUSH 100 UNIT/ML IV SOLN
500.0000 [IU] | Freq: Once | INTRAVENOUS | Status: AC | PRN
  Administered 2016-01-02: 500 [IU]
  Filled 2016-01-02: qty 5

## 2016-01-02 MED ORDER — TRASTUZUMAB CHEMO 150 MG IV SOLR
6.0000 mg/kg | Freq: Once | INTRAVENOUS | Status: AC
  Administered 2016-01-02: 441 mg via INTRAVENOUS
  Filled 2016-01-02: qty 21

## 2016-01-02 MED ORDER — ACETAMINOPHEN 325 MG PO TABS
ORAL_TABLET | ORAL | Status: AC
  Filled 2016-01-02: qty 2

## 2016-01-02 MED ORDER — PEGFILGRASTIM 6 MG/0.6ML ~~LOC~~ PSKT
6.0000 mg | PREFILLED_SYRINGE | Freq: Once | SUBCUTANEOUS | Status: AC
  Administered 2016-01-02: 6 mg via SUBCUTANEOUS
  Filled 2016-01-02: qty 0.6

## 2016-01-02 MED ORDER — DEXTROSE 5 % IV SOLN
65.0000 mg/m2 | Freq: Once | INTRAVENOUS | Status: AC
  Administered 2016-01-02: 120 mg via INTRAVENOUS
  Filled 2016-01-02: qty 12

## 2016-01-02 MED ORDER — SODIUM CHLORIDE 0.9 % IV SOLN: Freq: Once | INTRAVENOUS | Status: AC

## 2016-01-02 MED ORDER — PALONOSETRON HCL INJECTION 0.25 MG/5ML
INTRAVENOUS | Status: AC
  Filled 2016-01-02: qty 5

## 2016-01-02 MED ORDER — SODIUM CHLORIDE 0.9 % IV SOLN
600.0000 mg | Freq: Once | INTRAVENOUS | Status: AC
  Administered 2016-01-02: 600 mg via INTRAVENOUS
  Filled 2016-01-02: qty 60

## 2016-01-02 MED ORDER — ACETAMINOPHEN 325 MG PO TABS
650.0000 mg | ORAL_TABLET | Freq: Once | ORAL | Status: AC
  Administered 2016-01-02: 650 mg via ORAL

## 2016-01-02 MED ORDER — SODIUM CHLORIDE 0.9% FLUSH
10.0000 mL | INTRAVENOUS | Status: DC | PRN
  Administered 2016-01-02: 10 mL
  Filled 2016-01-02: qty 10

## 2016-01-02 MED ORDER — DIPHENHYDRAMINE HCL 25 MG PO CAPS
ORAL_CAPSULE | ORAL | Status: AC
  Filled 2016-01-02: qty 2

## 2016-01-02 MED ORDER — PALONOSETRON HCL INJECTION 0.25 MG/5ML
0.2500 mg | Freq: Once | INTRAVENOUS | Status: AC
  Administered 2016-01-02: 0.25 mg via INTRAVENOUS

## 2016-01-02 MED ORDER — SODIUM CHLORIDE 0.9 % IV SOLN
Freq: Once | INTRAVENOUS | Status: AC
  Administered 2016-01-02: 10:00:00 via INTRAVENOUS

## 2016-01-02 NOTE — Progress Notes (Signed)
[place] Work  Clinical Social Work was referred by need for CSW follow up. Clinical Social Worker met with patient at [facility] during treatment to offer support and assess for needs. CSW provided massage vouchers for pt and reviewed additional support resources again with pt in person. Pt appears open to some of the more physical options for support and may try the yoga classes. Pt in good spirits today and was thankful for massage vouchers. Pt agrees to reach out as needed.   Clinical Social Work interventions:  Supportive Psychiatric nurse education and referral  Loren Racer, [place] Worker [place]  [place] Phone: [phone] Fax: [phone]

## 2016-01-02 NOTE — Progress Notes (Signed)
Patient Care Team: Donella Stade, PA-C as PCP - General (Family Medicine) Erroll Luna, MD as Consulting Physician (General Surgery) Nicholas Lose, MD as Consulting Physician (Hematology and Oncology) Gery Pray, MD as Consulting Physician (Radiation Oncology)  DIAGNOSIS:  Encounter Diagnosis  Name Primary?  . Malignant neoplasm of lower-outer quadrant of left breast of female, estrogen receptor positive ([place]) Yes    SUMMARY OF ONCOLOGIC HISTORY:   Breast cancer of lower-outer quadrant of left female breast (Jenna [name])   09/10/2015 Initial Diagnosis    Screening detected left breast asymmetry: 2 adjacent masses 4:00: 8 mm and 3:30: 9 mm; axilla negative, biopsy grade 2-3 IDC, ER 70%, PR 5%, Ki-67 10%, HER-2 positive ratio 3.41, T1BN0 stage IA      09/25/2015 Surgery    Left lumpectomy: IDC with DCIS, 1.2 cm, margins negative, 0/4 lymph nodes negative, grade 3, ER 70%, PR 5%, HER-2 positive ratio 3.45, Ki-67 10%, T1 CN 0 stage IA      10/31/2015 -  Chemotherapy    Adjuvant chemotherapy with TCH 6 cycles followed by Herceptin maintenance for 1 year      CHIEF COMPLIANT: Cycle 4 TCH  INTERVAL HISTORY: Jenna [name] is a 55 year old [place] woman with a history of triple positive stage IA breast cancer, being treated adjuvantly with trastuzumab, carboplatin, and docetaxel. She is here today for cycle #4.  Her most recent echo was 10/06/2015 and showed an ejection fraction of 55-60%.  REVIEW OF SYSTEMS:   Gen. felt very tired after her third cycle of chemotherapy. She is sleeping poorly. She is trying to exercise by taking walks, but the weather is not helping. She has pain in her surgical breast and the left medial upper arm, related to her prior surgery. She has a bit of a runny nose and some seasonal allergy symptoms. She has mild epistaxis. She has heartburn, fairly well controlled on omeprazole. She complains of easy bruising, anxiety, and depression. She had no significant  nausea, no mouth sores, no change in bladder or bowel habits with her recent treatment and she tells me her port is working well. A detailed review of systems today was otherwise stable.  I have reviewed the past medical history, past surgical history, social history and family history with the patient and they are unchanged from previous note.  ALLERGIES:  is allergic to sulfa antibiotics.  MEDICATIONS:  Current Outpatient Prescriptions  Medication Sig Dispense Refill  . Albuterol Sulfate (PROAIR RESPICLICK) 026 (90 BASE) MCG/ACT AEPB Inhale 2 puffs into the lungs every 6 (six) hours. (Patient not taking: Reported on 10/06/2015) 3 each 3  . amoxicillin (AMOXIL) 500 MG capsule Take 1 capsule (500 mg total) by mouth 2 (two) times daily. (Patient not taking: Reported on 12/10/2015) 14 capsule 0  . aspirin EC 81 MG tablet Take 81 mg by mouth daily.    Marland Kitchen dexamethasone (DECADRON) 4 MG tablet Take 1 tablet (4 mg total) by mouth 2 (two) times daily. Start the day before Taxotere. Then again the day after chemo for 1 day 30 tablet 1  . dicyclomine (BENTYL) 20 MG tablet Take 1 tablet (20 mg total) by mouth every 6 (six) hours. As needed for cramps in abdomen (Patient not taking: Reported on 12/10/2015) 30 tablet 0  . DULoxetine (CYMBALTA) 60 MG capsule Take 1 capsule (60 mg total) by mouth daily. 90 capsule 3  . Lactobacillus (DIGESTIVE HEALTH PROBIOTIC PO) Take by mouth.    . levothyroxine (SYNTHROID, LEVOTHROID) 50 MCG tablet  Take 1 tablet (50 mcg total) by mouth daily. Needs lab work 30 tablet 0  . lidocaine-prilocaine (EMLA) cream Apply to affected area once 30 g 3  . linaclotide (LINZESS) 290 MCG CAPS capsule Take 1 capsule (290 mcg total) by mouth daily. (Patient not taking: Reported on 12/10/2015) 90 capsule 1  . liothyronine (CYTOMEL) 25 MCG tablet TAKE 1 TABLET (25 MCG TOTAL) BY MOUTH DAILY. 30 tablet 0  . LORazepam (ATIVAN) 0.5 MG tablet Take 1 tablet (0.5 mg total) by mouth at bedtime. 30 tablet 0    . omeprazole (PRILOSEC) 20 MG capsule Take 1 capsule (20 mg total) by mouth 2 (two) times daily before a meal. 180 capsule 4  . ondansetron (ZOFRAN) 8 MG tablet Take 1 tablet (8 mg total) by mouth 2 (two) times daily as needed for refractory nausea / vomiting. Start on day 3 after chemo. 30 tablet 1  . oxyCODONE-acetaminophen (ROXICET) 5-325 MG tablet Take 1-2 tablets by mouth every 4 (four) hours as needed. (Patient not taking: Reported on 12/10/2015) 30 tablet 0  . Oxymetazoline HCl (NASAL SPRAY NA) Place into the nose.    . Polyethylene Glycol 3350 (MIRALAX PO) Take by mouth.    . prochlorperazine (COMPAZINE) 10 MG tablet Take 1 tablet (10 mg total) by mouth every 6 (six) hours as needed (Nausea or vomiting). (Patient not taking: Reported on 12/10/2015) 30 tablet 1  . Simethicone (GAS-X PO) Take by mouth.    . triamcinolone (NASACORT) 55 MCG/ACT AERO nasal inhaler Place into the nose.     No current facility-administered medications for this visit.     PHYSICAL EXAMINATION: ECOG PERFORMANCE STATUS: 1 - Symptomatic but completely ambulatory  Vitals:   01/02/16 0917  BP: (!) 146/75  Pulse: 85  Resp: 18  Temp: 97.8 F (36.6 C)   Filed Weights   01/02/16 0917  Weight: 167 lb (75.8 kg)    Sclerae unicteric, pupils round and equal Oropharynx clear and moist-- no thrush or other lesions No cervical or supraclavicular adenopathy Lungs no rales or rhonchi Heart regular rate and rhythm Abd soft, nontender, positive bowel sounds MSK no focal spinal tenderness, no upper extremity lymphedema Neuro: nonfocal, well oriented, appropriate affect Breasts: The right breast is unremarkable. The left breast is status post prior lumpectomy. The cosmetic result is good. There is no evidence of local recurrence. The left axilla is benign.    LABORATORY DATA:  I have reviewed the data as listed   Chemistry      Component Value Date/Time   NA 143 01/02/2016 0852   K 4.7 01/02/2016 0852   CL  102 08/12/2015 0959   CO2 26 01/02/2016 0852   BUN 14.4 01/02/2016 0852   CREATININE 0.7 01/02/2016 0852      Component Value Date/Time   CALCIUM 9.6 01/02/2016 0852   ALKPHOS 79 01/02/2016 0852   AST 18 01/02/2016 0852   ALT 28 01/02/2016 0852   BILITOT 0.35 01/02/2016 0852       Lab Results  Component Value Date   WBC 7.5 01/02/2016   HGB 10.4 (L) 01/02/2016   HCT 30.8 (L) 01/02/2016   MCV 93.9 01/02/2016   PLT 208 01/02/2016   NEUTROABS 6.5 01/02/2016    ASSESSMENT & PLAN:  Breast cancer of lower-outer quadrant of left female breast ([place]) Left lumpectomy 09/25/2015: IDC with DCIS, 1.2 cm, margins negative, 0/4 lymph nodes negative, grade 3, ER 70%, PR 5%, HER-2 positive ratio 3.45, Ki-67 10%, T1 CN 0 stage  IA  Pathology counseling: Dr Lindi Adie has discussed the final pathology report of the patient provided  a copy of this report. I discussed the margins as well as lymph node surgeries. We also discussed the final staging along with previously performed ER/PR and HER-2/neu testing.  Treatment plan: 1. Adjuvant chemotherapy with TCH 6 cycles followed by Herceptin every 3 weeks for 1 year 2. Followed by adjuvant radiation 3. Followed by adjuvant antiestrogen therapy ---------------------------------------------------------------------------------------------------------------------------------- Current Treatment: TCH cycle 4 day 1 Echocardiogram 10/06/2015: EF 55-60%  Chemotherapy toxicities: 1. Nauseaand vomiting grade 2 2. fatigue grade 2: First week after chemotherapy fatigue is really 3. Diarrhea grade 2: Improved 4. Sinus drainage and dizziness: Previously treated with amoxicillin for 1 week 5. Dehydration: She previously required IV fluids 6. Bone pain due to Neulasta: Improved with Claritin 7. Chemotherapy-induced anemia: I decreased the dosage of cycle 3 Taxotere to 65 mg/m  [name] is tolerating her treatment generally well. Aside from anxiety and  depression the biggest problem she is having are related fatigue. The best treatment for that is exercise and I have strongly encouraged her to do what she can in terms of walking, which is she tells me her only option. She is not a member of local gym.  We are proceeding to treatment today. She really has an appointment with Dr. Sonny Dandy on day 1 of her fifth cycle, but she knows to call us before then if any problems develop.  No orders of the defined types were placed in this encounter.  The patient has a good understanding of the overall plan. she agrees with it. she will call with any problems that may develop before the next visit here.   Chauncey Cruel, MD 01/03/16

## 2016-01-02 NOTE — Patient Instructions (Addendum)
[place] [facility] Discharge Instructions for Patients Receiving Chemotherapy  Today you received the following chemotherapy agents: herceptin, taxotere, carboplatin  To help prevent nausea and vomiting after your treatment, we encourage you to take your nausea medication.  Take it as often as prescribed.     If you develop nausea and vomiting that is not controlled by your nausea medication, call the clinic. If it is after clinic hours your family physician or the after hours number for the clinic or go to the Emergency Department.   BELOW ARE SYMPTOMS THAT SHOULD BE REPORTED IMMEDIATELY:  *FEVER GREATER THAN 100.5 F  *CHILLS WITH OR WITHOUT FEVER  NAUSEA AND VOMITING THAT IS NOT CONTROLLED WITH YOUR NAUSEA MEDICATION  *UNUSUAL SHORTNESS OF BREATH  *UNUSUAL BRUISING OR BLEEDING  TENDERNESS IN MOUTH AND THROAT WITH OR WITHOUT PRESENCE OF ULCERS  *URINARY PROBLEMS  *BOWEL PROBLEMS  UNUSUAL RASH Items with * indicate a potential emergency and should be followed up as soon as possible.  One of the nurses will contact you 24 hours after your treatment. Please let the nurse know about any problems that you may have experienced. Feel free to call the clinic you have any questions or concerns. The clinic phone number is [phone].   I have been informed and understand all the instructions given to me. I know to contact the clinic, my physician, or go to the Emergency Department if any problems should occur. I do not have any questions at this time, but understand that I may call the clinic during office hours   should I have any questions or need assistance in obtaining follow up care.    __________________________________________  _____________  __________ Signature of Patient or Authorized Representative            Date                   Time    __________________________________________ Nurse's Signature

## 2016-01-05 DIAGNOSIS — Z923 Personal history of irradiation: Secondary | ICD-10-CM

## 2016-01-05 HISTORY — PX: REDUCTION MAMMAPLASTY: SUR839

## 2016-01-05 HISTORY — DX: Personal history of irradiation: Z92.3

## 2016-01-06 ENCOUNTER — Encounter: Payer: Self-pay | Admitting: Physician Assistant

## 2016-01-09 ENCOUNTER — Telehealth: Payer: Self-pay | Admitting: *Deleted

## 2016-01-09 NOTE — Telephone Encounter (Signed)
Spouse called at 3:20 pm.  "Jenna [name] has been asleep for 18 hours.  I wake her up for sips of water.  Has had low activity and Neulasta effects.  No other symptoms.  Temp = 98.6.  Yesterday she ate and drank 64 oz water.  This morning she took lorazepam to ward off nausea.  She takes zofran and ativan together because she does not like the effect of the compazine and the two get rid of the nausea all together.  Has not vomited.  She also took dicyclomine and duloxetine anti-depressant and has not been eating or drinking as much." Spoke with Jenna [name] who denies dizziness.  Reports "My body just feels tired so I'm lying in bed.  I have no other symptoms except stabbing left jaw pain, internally under the ear that comes and goes.  Usually happens at night."  Denies sleeping on the left side but on her stomach.   Instructed not to take these medicines at the same time. If hasn't vomiting do not take ativan as this is for vomiting uncontrolled by the other two anti-emetics.  Get up slowly to get her bearings before she moves.  Make a trail in her home and walk three to four times in the house.  Get out of bed, sit at a table and play a game of cards to engage her mind.  Eat and drink to stay hydrated.

## 2016-01-15 ENCOUNTER — Telehealth: Payer: Self-pay | Admitting: Emergency Medicine

## 2016-01-15 NOTE — Telephone Encounter (Signed)
Called patient and left a message; attempt to follow up on patient's status and inquire about how she is feeling. Advised patient to call for any questions or concerns. Instructed patient to follow up as scheduled.

## 2016-01-16 ENCOUNTER — Other Ambulatory Visit: Payer: Self-pay | Admitting: Physician Assistant

## 2016-01-19 ENCOUNTER — Other Ambulatory Visit: Payer: Self-pay | Admitting: Physician Assistant

## 2016-01-19 DIAGNOSIS — E063 Autoimmune thyroiditis: Secondary | ICD-10-CM

## 2016-01-21 ENCOUNTER — Ambulatory Visit (HOSPITAL_COMMUNITY)
Admission: RE | Admit: 2016-01-21 | Discharge: 2016-01-21 | Disposition: A | Discharge status: Home / Self Care | Payer: BLUE CROSS/BLUE SHIELD | Source: Ambulatory Visit | Attending: Internal Medicine | Admitting: Internal Medicine

## 2016-01-21 ENCOUNTER — Encounter (HOSPITAL_COMMUNITY): Payer: BLUE CROSS/BLUE SHIELD | Admitting: Internal Medicine

## 2016-01-21 DIAGNOSIS — C50512 Malignant neoplasm of lower-outer quadrant of left female breast: Secondary | ICD-10-CM

## 2016-01-22 NOTE — Assessment & Plan Note (Signed)
Left lumpectomy 09/25/2015: IDC with DCIS, 1.2 cm, margins negative, 0/4 lymph nodes negative, grade 3, ER 70%, PR 5%, HER-2 positive ratio 3.45, Ki-67 10%, T1 CN 0 stage IA  Pathology counseling: Dr Lindi Adie has discussed the final pathology report of the patient provided a copy of this report. I discussed the margins as well as lymph node surgeries. We also discussed the final staging along with previously performed ER/PR and HER-2/neu testing.  Treatment plan: 1. Adjuvant chemotherapy with [place] 6 cyclesfollowed by Herceptin every 3 weeks for 1 year 2. Followed by adjuvant radiation 3. Followed by adjuvant antiestrogen therapy ---------------------------------------------------------------------------------------------------------------------------------- Current Treatment: TCH cycle 5 day 1 Echocardiogram 10/06/2015: EF 55-60%  Chemotherapy toxicities: 1. Nauseaand vomiting grade 2 2. fatigue grade 2: First week after chemotherapy fatigue is really 3. Diarrhea grade 2: Improved 4. Sinus drainage and dizziness: Previously treated with amoxicillin for 1 week 5. Dehydration: She previously required IV fluids 6. Bone pain due to Neulasta: Improved with Claritin 7. Chemotherapy-induced anemia: I decreased the dosage of cycle 3 Taxotere to 65 mg/m  RTC with cycle 5

## 2016-01-23 ENCOUNTER — Other Ambulatory Visit (HOSPITAL_BASED_OUTPATIENT_CLINIC_OR_DEPARTMENT_OTHER): Payer: BLUE CROSS/BLUE SHIELD

## 2016-01-23 ENCOUNTER — Ambulatory Visit (HOSPITAL_BASED_OUTPATIENT_CLINIC_OR_DEPARTMENT_OTHER): Payer: BLUE CROSS/BLUE SHIELD

## 2016-01-23 ENCOUNTER — Ambulatory Visit (HOSPITAL_BASED_OUTPATIENT_CLINIC_OR_DEPARTMENT_OTHER): Payer: BLUE CROSS/BLUE SHIELD | Admitting: Hematology and Oncology

## 2016-01-23 ENCOUNTER — Encounter: Payer: Self-pay | Admitting: Hematology and Oncology

## 2016-01-23 ENCOUNTER — Other Ambulatory Visit: Payer: Self-pay | Admitting: Physician Assistant

## 2016-01-23 VITALS — HR 85

## 2016-01-23 DIAGNOSIS — Z5111 Encounter for antineoplastic chemotherapy: Secondary | ICD-10-CM

## 2016-01-23 DIAGNOSIS — C50512 Malignant neoplasm of lower-outer quadrant of left female breast: Secondary | ICD-10-CM

## 2016-01-23 DIAGNOSIS — Z170 Estrogen receptor positive status [ER+]: Principal | ICD-10-CM

## 2016-01-23 DIAGNOSIS — D6481 Anemia due to antineoplastic chemotherapy: Secondary | ICD-10-CM

## 2016-01-23 DIAGNOSIS — E860 Dehydration: Secondary | ICD-10-CM

## 2016-01-23 DIAGNOSIS — D6959 Other secondary thrombocytopenia: Secondary | ICD-10-CM | POA: Diagnosis not present

## 2016-01-23 DIAGNOSIS — Z5112 Encounter for antineoplastic immunotherapy: Secondary | ICD-10-CM | POA: Diagnosis not present

## 2016-01-23 DIAGNOSIS — T451X5A Adverse effect of antineoplastic and immunosuppressive drugs, initial encounter: Secondary | ICD-10-CM

## 2016-01-23 LAB — CBC WITH DIFFERENTIAL/PLATELET
BASO%: 0 % (ref 0.0–2.0)
Basophils Absolute: 0 10*3/uL (ref 0.0–0.1)
EOS ABS: 0 10*3/uL (ref 0.0–0.5)
EOS%: 0 % (ref 0.0–7.0)
HEMATOCRIT: 30.2 % — AB (ref 34.8–46.6)
HEMOGLOBIN: 10 g/dL — AB (ref 11.6–15.9)
LYMPH#: 0.7 10*3/uL — AB (ref 0.9–3.3)
LYMPH%: 13.1 % — ABNORMAL LOW (ref 14.0–49.7)
MCH: 32.5 pg (ref 25.1–34.0)
MCHC: 33.1 g/dL (ref 31.5–36.0)
MCV: 98.1 fL (ref 79.5–101.0)
MONO#: 0.3 10*3/uL (ref 0.1–0.9)
MONO%: 5 % (ref 0.0–14.0)
NEUT%: 81.9 % — ABNORMAL HIGH (ref 38.4–76.8)
NEUTROS ABS: 4.2 10*3/uL (ref 1.5–6.5)
Platelets: 93 10*3/uL — ABNORMAL LOW (ref 145–400)
RBC: 3.07 10*6/uL — ABNORMAL LOW (ref 3.70–5.45)
RDW: 20.3 % — AB (ref 11.2–14.5)
WBC: 5.2 10*3/uL (ref 3.9–10.3)

## 2016-01-23 LAB — COMPREHENSIVE METABOLIC PANEL
ALBUMIN: 3.6 g/dL (ref 3.5–5.0)
ALK PHOS: 73 U/L (ref 40–150)
ALT: 17 U/L (ref 0–55)
AST: 15 U/L (ref 5–34)
Anion Gap: 11 mEq/L (ref 3–11)
BILIRUBIN TOTAL: 0.51 mg/dL (ref 0.20–1.20)
BUN: 15.7 mg/dL (ref 7.0–26.0)
CALCIUM: 9.7 mg/dL (ref 8.4–10.4)
CO2: 24 mEq/L (ref 22–29)
Chloride: 108 mEq/L (ref 98–109)
Creatinine: 0.7 mg/dL (ref 0.6–1.1)
EGFR: >90 mL/min/{1.73_m2} (ref >90)
Glucose: 118 mg/dl (ref 70–140)
Potassium: 4.7 mEq/L (ref 3.5–5.1)
Sodium: 142 mEq/L (ref 136–145)
TOTAL PROTEIN: 6.7 g/dL (ref 6.4–8.3)

## 2016-01-23 MED ORDER — DIPHENHYDRAMINE HCL 25 MG PO CAPS
50.0000 mg | ORAL_CAPSULE | Freq: Once | ORAL | Status: AC
  Administered 2016-01-23: 50 mg via ORAL

## 2016-01-23 MED ORDER — SODIUM CHLORIDE 0.9 % IV SOLN
Freq: Once | INTRAVENOUS | Status: AC
  Administered 2016-01-23: 09:00:00 via INTRAVENOUS

## 2016-01-23 MED ORDER — SODIUM CHLORIDE 0.9% FLUSH
10.0000 mL | INTRAVENOUS | Status: DC | PRN
  Administered 2016-01-23: 10 mL
  Filled 2016-01-23: qty 10

## 2016-01-23 MED ORDER — DOCETAXEL CHEMO INJECTION 160 MG/16ML
65.0000 mg/m2 | Freq: Once | INTRAVENOUS | Status: AC
  Administered 2016-01-23: 120 mg via INTRAVENOUS
  Filled 2016-01-23: qty 12

## 2016-01-23 MED ORDER — TRASTUZUMAB CHEMO 150 MG IV SOLR
6.0000 mg/kg | Freq: Once | INTRAVENOUS | Status: AC
  Administered 2016-01-23: 441 mg via INTRAVENOUS
  Filled 2016-01-23: qty 21

## 2016-01-23 MED ORDER — PEGFILGRASTIM 6 MG/0.6ML ~~LOC~~ PSKT
6.0000 mg | PREFILLED_SYRINGE | Freq: Once | SUBCUTANEOUS | Status: AC
  Administered 2016-01-23: 6 mg via SUBCUTANEOUS
  Filled 2016-01-23: qty 0.6

## 2016-01-23 MED ORDER — HEPARIN SOD (PORK) LOCK FLUSH 100 UNIT/ML IV SOLN
500.0000 [IU] | Freq: Once | INTRAVENOUS | Status: AC | PRN
  Administered 2016-01-23: 500 [IU]
  Filled 2016-01-23: qty 5

## 2016-01-23 MED ORDER — ACETAMINOPHEN 325 MG PO TABS
650.0000 mg | ORAL_TABLET | Freq: Once | ORAL | Status: AC
  Administered 2016-01-23: 650 mg via ORAL

## 2016-01-23 MED ORDER — PALONOSETRON HCL INJECTION 0.25 MG/5ML
INTRAVENOUS | Status: AC
  Filled 2016-01-23: qty 5

## 2016-01-23 MED ORDER — SODIUM CHLORIDE 0.9 % IV SOLN
Freq: Once | INTRAVENOUS | Status: AC
  Administered 2016-01-23: 09:00:00 via INTRAVENOUS
  Filled 2016-01-23: qty 5

## 2016-01-23 MED ORDER — SODIUM CHLORIDE 0.9 % IV SOLN
478.8000 mg | Freq: Once | INTRAVENOUS | Status: AC
  Administered 2016-01-23: 480 mg via INTRAVENOUS
  Filled 2016-01-23: qty 48

## 2016-01-23 MED ORDER — ACETAMINOPHEN 325 MG PO TABS
ORAL_TABLET | ORAL | Status: AC
  Filled 2016-01-23: qty 2

## 2016-01-23 MED ORDER — SODIUM CHLORIDE 0.9 % IV SOLN
INTRAVENOUS | Status: AC
  Administered 2016-01-23: 09:00:00 via INTRAVENOUS

## 2016-01-23 MED ORDER — PALONOSETRON HCL INJECTION 0.25 MG/5ML
0.2500 mg | Freq: Once | INTRAVENOUS | Status: AC
  Administered 2016-01-23: 0.25 mg via INTRAVENOUS

## 2016-01-23 MED ORDER — DIPHENHYDRAMINE HCL 25 MG PO CAPS
ORAL_CAPSULE | ORAL | Status: AC
  Filled 2016-01-23: qty 2

## 2016-01-23 NOTE — Progress Notes (Signed)
Patient Care Team: Donella Stade, PA-C as PCP - General (Family Medicine) Erroll Luna, MD as Consulting Physician (General Surgery) Nicholas Lose, MD as Consulting Physician (Hematology and Oncology) Gery Pray, MD as Consulting Physician (Radiation Oncology)  DIAGNOSIS:  Encounter Diagnosis  Name Primary?  . Malignant neoplasm of lower-outer quadrant of left breast of female, estrogen receptor positive ([place])     SUMMARY OF ONCOLOGIC HISTORY:   Breast cancer of lower-outer quadrant of left female breast ([place])   09/10/2015 Initial Diagnosis    Screening detected left breast asymmetry: 2 adjacent masses 4:00: 8 mm and 3:30: 9 mm; axilla negative, biopsy grade 2-3 IDC, ER 70%, PR 5%, Ki-67 10%, HER-2 positive ratio 3.41, T1BN0 stage IA      09/25/2015 Surgery    Left lumpectomy: IDC with DCIS, 1.2 cm, margins negative, 0/4 lymph nodes negative, grade 3, ER 70%, PR 5%, HER-2 positive ratio 3.45, Ki-67 10%, T1 CN 0 stage IA      10/31/2015 -  Chemotherapy    Adjuvant chemotherapy with TCH 6 cycles followed by Herceptin maintenance for 1 year       CHIEF COMPLIANT: Cycle 5 TCH  INTERVAL HISTORY: Jenna [name] is a 56 year old with above-mentioned history of left breast cancer treated with lumpectomy and is currently on adjuvant chemotherapy. Today is cycle 5 of TCH. She complains of fatigue, nausea, diarrhea, dehydration related to chemotherapy. She was also found to be anemic during chemotherapy. She is also thrombocytopenic today. Her fatigue is extremely profound to the point that she sleeps most of the time. Her husband who is very worried that she is not eating or drinking when she is so tired and sleepy.  REVIEW OF SYSTEMS:   Constitutional: Denies fevers, chills or abnormal weight loss, complains of fatigue Eyes: Denies blurriness of vision Ears, nose, mouth, throat, and face: Denies mucositis or sore throat Respiratory: Denies cough, dyspnea or wheezes Cardiovascular:  Denies palpitation, chest discomfort Gastrointestinal:  Nausea, diarrhea Skin: Denies abnormal skin rashes Lymphatics: Denies new lymphadenopathy or easy bruising Neurological:Denies numbness, tingling or new weaknesses Behavioral/Psych: Mood is stable, no new changes  Extremities: No lower extremity edema Breast:  denies any pain or lumps or nodules in either breasts All other systems were reviewed with the patient and are negative.  I have reviewed the past medical history, past surgical history, social history and family history with the patient and they are unchanged from previous note.  ALLERGIES:  is allergic to sulfa antibiotics.  MEDICATIONS:  Current Outpatient Prescriptions  Medication Sig Dispense Refill  . Albuterol Sulfate (PROAIR RESPICLICK) 376 (90 BASE) MCG/ACT AEPB Inhale 2 puffs into the lungs every 6 (six) hours. (Patient not taking: Reported on 10/06/2015) 3 each 3  . amoxicillin (AMOXIL) 500 MG capsule Take 1 capsule (500 mg total) by mouth 2 (two) times daily. (Patient not taking: Reported on 12/10/2015) 14 capsule 0  . aspirin EC 81 MG tablet Take 81 mg by mouth daily.    Marland Kitchen dexamethasone (DECADRON) 4 MG tablet Take 1 tablet (4 mg total) by mouth 2 (two) times daily. Start the day before Taxotere. Then again the day after chemo for 1 day 30 tablet 1  . dicyclomine (BENTYL) 20 MG tablet Take 1 tablet (20 mg total) by mouth every 6 (six) hours. As needed for cramps in abdomen (Patient not taking: Reported on 12/10/2015) 30 tablet 0  . DULoxetine (CYMBALTA) 60 MG capsule Take 1 capsule (60 mg total) by mouth daily. 90 capsule 3  .  Lactobacillus (DIGESTIVE HEALTH PROBIOTIC PO) Take by mouth.    . levothyroxine (SYNTHROID, LEVOTHROID) 50 MCG tablet TAKE 1 TABLET (50 MCG TOTAL) BY MOUTH DAILY. NEEDS LAB WORK 15 tablet 0  . lidocaine-prilocaine (EMLA) cream Apply to affected area once 30 g 3  . linaclotide (LINZESS) 290 MCG CAPS capsule Take 1 capsule (290 mcg total) by mouth  daily. (Patient not taking: Reported on 12/10/2015) 90 capsule 1  . liothyronine (CYTOMEL) 25 MCG tablet TAKE 1 TABLET (25 MCG TOTAL) BY MOUTH DAILY. 30 tablet 0  . LORazepam (ATIVAN) 0.5 MG tablet Take 1 tablet (0.5 mg total) by mouth at bedtime. 30 tablet 0  . omeprazole (PRILOSEC) 20 MG capsule Take 1 capsule (20 mg total) by mouth 2 (two) times daily before a meal. 180 capsule 4  . ondansetron (ZOFRAN) 8 MG tablet Take 1 tablet (8 mg total) by mouth 2 (two) times daily as needed for refractory nausea / vomiting. Start on day 3 after chemo. 30 tablet 1  . oxyCODONE-acetaminophen (ROXICET) 5-325 MG tablet Take 1-2 tablets by mouth every 4 (four) hours as needed. (Patient not taking: Reported on 12/10/2015) 30 tablet 0  . Oxymetazoline HCl (NASAL SPRAY NA) Place into the nose.    . Polyethylene Glycol 3350 (MIRALAX PO) Take by mouth.    . prochlorperazine (COMPAZINE) 10 MG tablet Take 1 tablet (10 mg total) by mouth every 6 (six) hours as needed (Nausea or vomiting). (Patient not taking: Reported on 12/10/2015) 30 tablet 1  . Simethicone (GAS-X PO) Take by mouth.    . triamcinolone (NASACORT) 55 MCG/ACT AERO nasal inhaler Place into the nose.     No current facility-administered medications for this visit.     PHYSICAL EXAMINATION: ECOG PERFORMANCE STATUS: 1 - Symptomatic but completely ambulatory  Vitals:   01/23/16 0844  BP: (!) 146/76  Pulse: (!) 113  Resp: 18  Temp: 98.1 F (36.7 C)   Filed Weights   01/23/16 0844  Weight: 165 lb 12.8 oz (75.2 kg)    GENERAL:alert, no distress and comfortable SKIN: skin color, texture, turgor are normal, no rashes or significant lesions EYES: normal, Conjunctiva are pink and non-injected, sclera clear OROPHARYNX:no exudate, no erythema and lips, buccal mucosa, and tongue normal  NECK: supple, thyroid normal size, non-tender, without nodularity LYMPH:  no palpable lymphadenopathy in the cervical, axillary or inguinal LUNGS: clear to auscultation  and percussion with normal breathing effort HEART: regular rate & rhythm and no murmurs and no lower extremity edema ABDOMEN:abdomen soft, non-tender and normal bowel sounds MUSCULOSKELETAL:no cyanosis of digits and no clubbing  NEURO: alert & oriented x 3 with fluent speech, no focal motor/sensory deficits EXTREMITIES: No lower extremity edema  LABORATORY DATA:  I have reviewed the data as listed   Chemistry      Component Value Date/Time   NA 142 01/23/2016 0806   K 4.7 01/23/2016 0806   CL 102 08/12/2015 0959   CO2 24 01/23/2016 0806   BUN 15.7 01/23/2016 0806   CREATININE 0.7 01/23/2016 0806      Component Value Date/Time   CALCIUM 9.7 01/23/2016 0806   ALKPHOS 73 01/23/2016 0806   AST 15 01/23/2016 0806   ALT 17 01/23/2016 0806   BILITOT 0.51 01/23/2016 0806       Lab Results  Component Value Date   WBC 5.2 01/23/2016   HGB 10.0 (L) 01/23/2016   HCT 30.2 (L) 01/23/2016   MCV 98.1 01/23/2016   PLT 93 (L) 01/23/2016  NEUTROABS 4.2 01/23/2016    ASSESSMENT & PLAN:  Breast cancer of lower-outer quadrant of left female breast ([place]) Left lumpectomy 09/25/2015: IDC with DCIS, 1.2 cm, margins negative, 0/4 lymph nodes negative, grade 3, ER 70%, PR 5%, HER-2 positive ratio 3.45, Ki-67 10%, T1 CN 0 stage IA  Treatment plan: 1. Adjuvant chemotherapy with [place] 6 cyclesfollowed by Herceptin every 3 weeks for 1 year 2. Followed by adjuvant radiation 3. Followed by adjuvant antiestrogen therapy ---------------------------------------------------------------------------------------------------------------------------------- Current Treatment: TCH cycle 5 day 1 Echocardiogram 10/06/2015: EF 55-60%  Chemotherapy toxicities: 1. Nauseaand vomiting grade 2 2. fatigue grade 2: First week after chemotherapy fatigue is really 3. Diarrhea grade 2: Improved 4. Sinus drainage and dizziness: Previously treated with amoxicillin for 1 week 5. Dehydration: She previously required IV  fluids 6. Bone pain due to Neulasta: Improved with Claritin 7. Chemotherapy-induced anemia: I decreased the dosage of cycle 3 Taxotere to 65 mg/m 8. Chemotherapy-induced cytopenia: I reduced the dosage of carboplatin to AUC 4 We will have her come in for 500 mL of fluids next week. RTC with cycle 6 and after that she'll be scheduled for every 3 week Herceptin.  I spent 25 minutes talking to the patient of which more than half was spent in counseling and coordination of care.  No orders of the defined types were placed in this encounter.  The patient has a good understanding of the overall plan. she agrees with it. she will call with any problems that may develop before the next visit here.   Rulon Eisenmenger, MD 01/23/16

## 2016-01-23 NOTE — Progress Notes (Signed)
Patient was scheduled to have repeat echo on 01/21/2016. Due to inclement weather, appt was canceled. Patient is awaiting a new appt.

## 2016-01-23 NOTE — Progress Notes (Signed)
Pt platelets today 93. Per Dr.Gudena, okay to treat today. Carbo dose reduced and additional ivf to be given today. Notified infusion nurse and is aware of plan.

## 2016-01-23 NOTE — Patient Instructions (Addendum)
May remove Neulasta On-Pro tomorrow, 01/24/2016, between 5:30pm and 6:00pm.  [facility] Discharge Instructions for Patients Receiving Chemotherapy  Today you received the following chemotherapy agents:  Herceptin, Taxotere, and Carboplatin.  To help prevent nausea and vomiting after your treatment, we encourage you to take your nausea medication as directed.   If you develop nausea and vomiting that is not controlled by your nausea medication, call the clinic.   BELOW ARE SYMPTOMS THAT SHOULD BE REPORTED IMMEDIATELY:  *FEVER GREATER THAN 100.5 F  *CHILLS WITH OR WITHOUT FEVER  NAUSEA AND VOMITING THAT IS NOT CONTROLLED WITH YOUR NAUSEA MEDICATION  *UNUSUAL SHORTNESS OF BREATH  *UNUSUAL BRUISING OR BLEEDING  TENDERNESS IN MOUTH AND THROAT WITH OR WITHOUT PRESENCE OF ULCERS  *URINARY PROBLEMS  *BOWEL PROBLEMS  UNUSUAL RASH Items with * indicate a potential emergency and should be followed up as soon as possible.  Feel free to call the clinic you have any questions or concerns. The clinic phone number is [phone].  Please show the [place] at check-in to the Emergency Department and triage nurse.

## 2016-01-25 ENCOUNTER — Other Ambulatory Visit: Payer: Self-pay | Admitting: Physician Assistant

## 2016-01-26 MED ORDER — LIOTHYRONINE SODIUM 25 MCG PO TABS: 25.0000 ug | ORAL_TABLET | Freq: Every day | ORAL | 0 refills | Status: DC

## 2016-01-27 ENCOUNTER — Encounter: Payer: Self-pay | Admitting: Hematology and Oncology

## 2016-01-30 ENCOUNTER — Ambulatory Visit (HOSPITAL_BASED_OUTPATIENT_CLINIC_OR_DEPARTMENT_OTHER): Payer: BLUE CROSS/BLUE SHIELD

## 2016-01-30 VITALS — BP 126/77 | HR 103 | Temp 98.7°F | Resp 17

## 2016-01-30 DIAGNOSIS — E860 Dehydration: Secondary | ICD-10-CM

## 2016-01-30 DIAGNOSIS — Z170 Estrogen receptor positive status [ER+]: Principal | ICD-10-CM

## 2016-01-30 DIAGNOSIS — C50512 Malignant neoplasm of lower-outer quadrant of left female breast: Secondary | ICD-10-CM | POA: Diagnosis not present

## 2016-01-30 MED ORDER — SODIUM CHLORIDE 0.9% FLUSH
10.0000 mL | INTRAVENOUS | Status: AC | PRN
  Administered 2016-01-30: 10 mL
  Filled 2016-01-30: qty 10

## 2016-01-30 MED ORDER — SODIUM CHLORIDE 0.9 % IV SOLN
Freq: Once | INTRAVENOUS | Status: AC
Start: 2016-01-30 — End: 2016-01-30
  Administered 2016-01-30: 15:00:00 via INTRAVENOUS

## 2016-01-30 MED ORDER — HEPARIN SOD (PORK) LOCK FLUSH 100 UNIT/ML IV SOLN
500.0000 [IU] | INTRAVENOUS | Status: AC | PRN
  Administered 2016-01-30: 500 [IU]
  Filled 2016-01-30: qty 5

## 2016-01-30 NOTE — Patient Instructions (Signed)
Dehydration, Adult Dehydration is a condition in which there is not enough fluid or water in the body. This happens when you lose more fluids than you take in. Important organs, such as the kidneys, brain, and heart, cannot function without a proper amount of fluids. Any loss of fluids from the body can lead to dehydration. Dehydration can range from mild to severe. This condition should be treated right away to prevent it from becoming severe. What are the causes? This condition may be caused by:  Vomiting.  Diarrhea.  Excessive sweating, such as from heat exposure or exercise.  Not drinking enough fluid, especially:  When ill.  While doing activity that requires a lot of energy.  Excessive urination.  Fever.  Infection.  Certain medicines, such as medicines that cause the body to lose excess fluid (diuretics).  Inability to access safe drinking water.  Reduced physical ability to get adequate water and food. What increases the risk? This condition is more likely to develop in people:  Who have a poorly controlled long-term (chronic) illness, such as diabetes, heart disease, or kidney disease.  Who are age 65 or older.  Who are disabled.  Who live in a place with high altitude.  Who play endurance sports. What are the signs or symptoms? Symptoms of mild dehydration may include:   Thirst.  Dry lips.  Slightly dry mouth.  Dry, warm skin.  Dizziness. Symptoms of moderate dehydration may include:   Very dry mouth.  Muscle cramps.  Dark urine. Urine may be the color of tea.  Decreased urine production.  Decreased tear production.  Heartbeat that is irregular or faster than normal (palpitations).  Headache.  Light-headedness, especially when you stand up from a sitting position.  Fainting (syncope). Symptoms of severe dehydration may include:   Changes in skin, such as:  Cold and clammy skin.  Blotchy (mottled) or pale skin.  Skin that does  not quickly return to normal after being lightly pinched and released (poor skin turgor).  Changes in body fluids, such as:  Extreme thirst.  No tear production.  Inability to sweat when body temperature is high, such as in hot weather.  Very little urine production.  Changes in vital signs, such as:  Weak pulse.  Pulse that is more than 100 beats a minute when sitting still.  Rapid breathing.  Low blood pressure.  Other changes, such as:  Sunken eyes.  Cold hands and feet.  Confusion.  Lack of energy (lethargy).  Difficulty waking up from sleep.  Short-term weight loss.  Unconsciousness. How is this diagnosed? This condition is diagnosed based on your symptoms and a physical exam. Blood and urine tests may be done to help confirm the diagnosis. How is this treated? Treatment for this condition depends on the severity. Mild or moderate dehydration can often be treated at home. Treatment should be started right away. Do not wait until dehydration becomes severe. Severe dehydration is an emergency and it needs to be treated in a hospital. Treatment for mild dehydration may include:   Drinking more fluids.  Replacing salts and minerals in your blood (electrolytes) that you may have lost. Treatment for moderate dehydration may include:   Drinking an oral rehydration solution (ORS). This is a drink that helps you replace fluids and electrolytes (rehydrate). It can be found at pharmacies and retail stores. Treatment for severe dehydration may include:   Receiving fluids through an IV tube.  Receiving an electrolyte solution through a feeding tube that is   passed through your nose and into your stomach (nasogastric tube, or NG tube).  Correcting any abnormalities in electrolytes.  Treating the underlying cause of dehydration. Follow these instructions at home:  If directed by your health care provider, drink an ORS:  Make an ORS by following instructions on the  package.  Start by drinking small amounts, about  cup (120 mL) every 5-10 minutes.  Slowly increase how much you drink until you have taken the amount recommended by your health care provider.  Drink enough clear fluid to keep your urine clear or pale yellow. If you were told to drink an ORS, finish the ORS first, then start slowly drinking other clear fluids. Drink fluids such as:  Water. Do not drink only water. Doing that can lead to having too little salt (sodium) in the body (hyponatremia).  Ice chips.  Fruit juice that you have added water to (diluted fruit juice).  Low-calorie sports drinks.  Avoid:  Alcohol.  Drinks that contain a lot of sugar. These include high-calorie sports drinks, fruit juice that is not diluted, and soda.  Caffeine.  Foods that are greasy or contain a lot of fat or sugar.  Take over-the-counter and prescription medicines only as told by your health care provider.  Do not take sodium tablets. This can lead to having too much sodium in the body (hypernatremia).  Eat foods that contain a healthy balance of electrolytes, such as bananas, oranges, potatoes, tomatoes, and spinach.  Keep all follow-up visits as told by your health care provider. This is important. Contact a health care provider if:  You have abdominal pain that:  Gets worse.  Stays in one area (localizes).  You have a rash.  You have a stiff neck.  You are more irritable than usual.  You are sleepier or more difficult to wake up than usual.  You feel weak or dizzy.  You feel very thirsty.  You have urinated only a small amount of very dark urine over 6-8 hours. Get help right away if:  You have symptoms of severe dehydration.  You cannot drink fluids without vomiting.  Your symptoms get worse with treatment.  You have a fever.  You have a severe headache.  You have vomiting or diarrhea that:  Gets worse.  Does not go away.  You have blood or green matter  (bile) in your vomit.  You have blood in your stool. This may cause stool to look black and tarry.  You have not urinated in 6-8 hours.  You faint.  Your heart rate while sitting still is over 100 beats a minute.  You have trouble breathing. This information is not intended to replace advice given to you by your health care provider. Make sure you discuss any questions you have with your health care provider. Document Released: 12/21/2004 Document Revised: 07/18/2015 Document Reviewed: 02/14/2015 Elsevier Interactive Patient Education  2017 [employer]  

## 2016-02-02 ENCOUNTER — Other Ambulatory Visit (HOSPITAL_COMMUNITY): Payer: BLUE CROSS/BLUE SHIELD

## 2016-02-03 ENCOUNTER — Other Ambulatory Visit: Payer: Self-pay | Admitting: Hematology and Oncology

## 2016-02-03 DIAGNOSIS — C50512 Malignant neoplasm of lower-outer quadrant of left female breast: Secondary | ICD-10-CM

## 2016-02-03 DIAGNOSIS — Z170 Estrogen receptor positive status [ER+]: Principal | ICD-10-CM

## 2016-02-10 ENCOUNTER — Other Ambulatory Visit: Payer: Self-pay | Admitting: *Deleted

## 2016-02-10 ENCOUNTER — Ambulatory Visit (HOSPITAL_BASED_OUTPATIENT_CLINIC_OR_DEPARTMENT_OTHER)
Admission: RE | Admit: 2016-02-10 | Discharge: 2016-02-10 | Disposition: A | Discharge status: Home / Self Care | Payer: BLUE CROSS/BLUE SHIELD | Source: Ambulatory Visit | Attending: Internal Medicine | Admitting: Internal Medicine

## 2016-02-10 ENCOUNTER — Encounter: Payer: Self-pay | Admitting: Hematology and Oncology

## 2016-02-10 ENCOUNTER — Encounter (HOSPITAL_COMMUNITY): Payer: Self-pay | Admitting: Internal Medicine

## 2016-02-10 ENCOUNTER — Other Ambulatory Visit: Payer: Self-pay

## 2016-02-10 ENCOUNTER — Ambulatory Visit (HOSPITAL_COMMUNITY)
Admission: RE | Admit: 2016-02-10 | Discharge: 2016-02-10 | Disposition: A | Discharge status: Home / Self Care | Payer: BLUE CROSS/BLUE SHIELD | Source: Ambulatory Visit | Attending: Cardiology | Admitting: Cardiology

## 2016-02-10 VITALS — BP 122/86 | HR 78 | Wt 168.5 lb

## 2016-02-10 DIAGNOSIS — Z170 Estrogen receptor positive status [ER+]: Secondary | ICD-10-CM | POA: Insufficient documentation

## 2016-02-10 DIAGNOSIS — Z882 Allergy status to sulfonamides status: Secondary | ICD-10-CM | POA: Insufficient documentation

## 2016-02-10 DIAGNOSIS — Z79899 Other long term (current) drug therapy: Secondary | ICD-10-CM | POA: Insufficient documentation

## 2016-02-10 DIAGNOSIS — Z841 Family history of disorders of kidney and ureter: Secondary | ICD-10-CM | POA: Insufficient documentation

## 2016-02-10 DIAGNOSIS — Z9221 Personal history of antineoplastic chemotherapy: Secondary | ICD-10-CM | POA: Diagnosis not present

## 2016-02-10 DIAGNOSIS — K589 Irritable bowel syndrome without diarrhea: Secondary | ICD-10-CM | POA: Insufficient documentation

## 2016-02-10 DIAGNOSIS — Z87891 Personal history of nicotine dependence: Secondary | ICD-10-CM | POA: Diagnosis not present

## 2016-02-10 DIAGNOSIS — R002 Palpitations: Secondary | ICD-10-CM

## 2016-02-10 DIAGNOSIS — Z5111 Encounter for antineoplastic chemotherapy: Secondary | ICD-10-CM | POA: Diagnosis not present

## 2016-02-10 DIAGNOSIS — Z833 Family history of diabetes mellitus: Secondary | ICD-10-CM | POA: Diagnosis not present

## 2016-02-10 DIAGNOSIS — Z923 Personal history of irradiation: Secondary | ICD-10-CM | POA: Insufficient documentation

## 2016-02-10 DIAGNOSIS — Z8249 Family history of ischemic heart disease and other diseases of the circulatory system: Secondary | ICD-10-CM | POA: Diagnosis not present

## 2016-02-10 DIAGNOSIS — C50512 Malignant neoplasm of lower-outer quadrant of left female breast: Secondary | ICD-10-CM | POA: Insufficient documentation

## 2016-02-10 DIAGNOSIS — K219 Gastro-esophageal reflux disease without esophagitis: Secondary | ICD-10-CM | POA: Diagnosis not present

## 2016-02-10 DIAGNOSIS — J45909 Unspecified asthma, uncomplicated: Secondary | ICD-10-CM | POA: Insufficient documentation

## 2016-02-10 DIAGNOSIS — E039 Hypothyroidism, unspecified: Secondary | ICD-10-CM | POA: Insufficient documentation

## 2016-02-10 MED ORDER — OMEPRAZOLE 20 MG PO CPDR: 20.0000 mg | DELAYED_RELEASE_CAPSULE | Freq: Two times a day (BID) | ORAL | 4 refills | Status: DC

## 2016-02-10 NOTE — Addendum Note (Signed)
Encounter addended by: Harvie Junior, CMA on: 02/10/2016 12:42 PM<BR>    Actions taken: Order list changed, Diagnosis association updated, Sign clinical note

## 2016-02-10 NOTE — Patient Instructions (Signed)
Your provider requests you have a 48 hour holter monitor.  Follow up and Echo with Dr.Bensimhon in 3 months.

## 2016-02-10 NOTE — Progress Notes (Signed)
Cardio-Oncology Clinic Note   Referring Physician: Dr. Lindi Adie Primary Care: Donella Stade, PA-C Heme/Onc: Dr. Lindi Adie Surgeon: Dr. Erroll Luna Rad/Onc: Dr. Gery Pray Primary Cardiologist: Dr. Haroldine Laws   HPI:  Jenna [name] is a 56 y.o. female with hypothyroidism, diagnosed with left breast CA in 9/17. ER+, PR+, HER-2 positive disease referred to Cardio-Oncology clinic for monitoring of cardiotoxicity while undergoing chemo.   Breast cancer of lower-outer quadrant of left female breast ([place])   09/10/2015 Initial Diagnosis    Screening detected left breast asymmetry: 2 adjacent masses 4:00: 8 mm and 3:30: 9 mm; axilla negative, biopsy grade 2-3 IDC, ER 70%, PR 5%, Ki-67 10%, HER-2 positive ratio 3.41, T1BN0 stage IA     09/25/2015 Surgery    Left lumpectomy: IDC with DCIS, 1.2 cm, margins negative, 0/4 lymph nodes negative, grade 3, ER 70%, PR 5%, HER-2 positive ratio 3.45, Ki-67 10%, T1 CN 0 stage IA   Treatment Plan 1. Adjuvant chemotherapy with TCH 6 cycles followed by Herceptin every 3 weeks for 1 year 2. Followed by adjuvant radiation 3. Followed by adjuvant antiestrogen therapy   Echo 10/06/15  EF 55-60% Lateral s' 10.14 GLS -21.2% Echo 02/10/16    EF 55%   Lateral s' 9.8 cm/s GLS -22.6%  She has finished 5/6 chemo treatments to date. Feels weak and rundown ("battered"). No edema. Mild orthopnea. + palpitations on lying down.    Past Medical History:  Diagnosis Date  . Anxiety   . Asthma   . Breast cancer of lower-outer quadrant of left female breast ([place]) 09/12/2015  . Complication of anesthesia    hard time waking up with gallbladder  . GERD (gastroesophageal reflux disease)   . Hashimoto's disease   . Hypothyroidism   . Irritable bowel   . Uterine displacement     Current Outpatient Prescriptions  Medication Sig Dispense Refill  . dexamethasone (DECADRON) 4 MG tablet Take 1 tablet (4 mg total) by mouth 2 (two) times daily. Start the day before  Taxotere. Then again the day after chemo for 1 day 30 tablet 1  . levothyroxine (SYNTHROID, LEVOTHROID) 50 MCG tablet TAKE 1 TABLET (50 MCG TOTAL) BY MOUTH DAILY. NEEDS LAB WORK 15 tablet 0  . lidocaine-prilocaine (EMLA) cream Apply to affected area once 30 g 3  . liothyronine (CYTOMEL) 25 MCG tablet Take 1 tablet (25 mcg total) by mouth daily. 30 tablet 0  . LORazepam (ATIVAN) 0.5 MG tablet Take 1 tablet (0.5 mg total) by mouth at bedtime. 30 tablet 0  . omeprazole (PRILOSEC) 20 MG capsule Take 1 capsule (20 mg total) by mouth 2 (two) times daily before a meal. 180 capsule 4  . ondansetron (ZOFRAN) 8 MG tablet TAKE 1 TABLET BY MOUTH TWICE A DAY AS NEEDED FOR NAUSEA/VOMITING. START 3 DAYS AFTER CHEMO 30 tablet 1  . prochlorperazine (COMPAZINE) 10 MG tablet Take 1 tablet (10 mg total) by mouth every 6 (six) hours as needed (Nausea or vomiting). 30 tablet 1  . Albuterol Sulfate (PROAIR RESPICLICK) 786 (90 BASE) MCG/ACT AEPB Inhale 2 puffs into the lungs every 6 (six) hours. (Patient not taking: Reported on 10/06/2015) 3 each 3  . dicyclomine (BENTYL) 20 MG tablet Take 1 tablet (20 mg total) by mouth every 6 (six) hours. As needed for cramps in abdomen (Patient not taking: Reported on 12/10/2015) 30 tablet 0  . oxyCODONE-acetaminophen (ROXICET) 5-325 MG tablet Take 1-2 tablets by mouth every 4 (four) hours as needed. (Patient not taking: Reported on 12/10/2015)  30 tablet 0   No current facility-administered medications for this encounter.     Allergies  Allergen Reactions  . Sulfa Antibiotics Rash      Social History   Social History  . Marital status: Married    Spouse name: N/A  . Number of children: N/A  . Years of education: N/A   Occupational History  . Not on file.   Social History Main Topics  . Smoking status: Former Research scientist (life sciences)  . Smokeless tobacco: Never Used  . Alcohol use 0.6 oz/week    1 Standard drinks or equivalent per week     Comment: social  . Drug use: No  . Sexual  activity: Yes    Partners: Male   Other Topics Concern  . Not on file   Social History Narrative  . No narrative on file      Family History  Problem Relation Age of Onset  . Diabetes Mother   . Hypertension Mother   . Hyperlipidemia Mother   . Kidney disease Mother   . Cancer Maternal Grandfather     Vitals:   02/10/16 1137  BP: 122/86  Pulse: 78  SpO2: 98%  Weight: 168 lb 8 oz (76.4 kg)    PHYSICAL EXAM: General:  Well appearing. No respiratory difficulty HEENT: normal x for alopecia Neck: supple. no JVD. R port-a-cath Carotids 2+ bilat; no bruits. No lymphadenopathy or thryomegaly appreciated. Cor: PMI nondisplaced. Regular rate & rhythm. No rubs, gallops or murmurs. Lungs: clear Abdomen: soft, nontender, nondistended. No hepatosplenomegaly. No bruits or masses. Good bowel sounds. Extremities: no cyanosis, clubbing, rash, edema Neuro: alert & oriented x 3, cranial nerves grossly intact. moves all 4 extremities w/o difficulty. Affect pleasant.   ASSESSMENT & PLAN:  1. Left breast Cancer, triple + positive --s/p left lumpectomy and LN dissection 9/17 treatment plan will be: --. Adjuvant chemotherapy with TCH 6 cycles followed by Herceptin every 3 weeks for 1 year (start October 21) -- Now s/p 5/6 cycles. I reviewed echos personally. EF and Doppler parameters stable. No HF on exam. Continue Herceptin.  --Followed by adjuvant radiation --Followed by adjuvant antiestrogen therapy --Echo in 3 months 2. Nighttime palpitations --Will need 48 hour monitor  --Check ECG   Linda Grimmer,MD 12:32 PM

## 2016-02-10 NOTE — Progress Notes (Signed)
  Echocardiogram 2D Echocardiogram has been performed.  Jennette Dubin 02/10/2016, 10:56 AM

## 2016-02-13 ENCOUNTER — Other Ambulatory Visit (HOSPITAL_BASED_OUTPATIENT_CLINIC_OR_DEPARTMENT_OTHER): Payer: BLUE CROSS/BLUE SHIELD

## 2016-02-13 ENCOUNTER — Ambulatory Visit (INDEPENDENT_AMBULATORY_CARE_PROVIDER_SITE_OTHER): Payer: BLUE CROSS/BLUE SHIELD

## 2016-02-13 ENCOUNTER — Ambulatory Visit (HOSPITAL_BASED_OUTPATIENT_CLINIC_OR_DEPARTMENT_OTHER): Payer: BLUE CROSS/BLUE SHIELD

## 2016-02-13 ENCOUNTER — Ambulatory Visit (HOSPITAL_BASED_OUTPATIENT_CLINIC_OR_DEPARTMENT_OTHER): Payer: BLUE CROSS/BLUE SHIELD | Admitting: Hematology and Oncology

## 2016-02-13 ENCOUNTER — Encounter: Payer: Self-pay | Admitting: *Deleted

## 2016-02-13 DIAGNOSIS — E860 Dehydration: Secondary | ICD-10-CM

## 2016-02-13 DIAGNOSIS — C50512 Malignant neoplasm of lower-outer quadrant of left female breast: Secondary | ICD-10-CM

## 2016-02-13 DIAGNOSIS — Z170 Estrogen receptor positive status [ER+]: Secondary | ICD-10-CM | POA: Diagnosis not present

## 2016-02-13 DIAGNOSIS — D6481 Anemia due to antineoplastic chemotherapy: Secondary | ICD-10-CM | POA: Diagnosis not present

## 2016-02-13 DIAGNOSIS — R002 Palpitations: Secondary | ICD-10-CM

## 2016-02-13 DIAGNOSIS — Z5111 Encounter for antineoplastic chemotherapy: Secondary | ICD-10-CM | POA: Diagnosis not present

## 2016-02-13 DIAGNOSIS — Z5112 Encounter for antineoplastic immunotherapy: Secondary | ICD-10-CM | POA: Diagnosis not present

## 2016-02-13 LAB — CBC WITH DIFFERENTIAL/PLATELET
BASO%: 0.3 % (ref 0.0–2.0)
BASOS ABS: 0 10*3/uL (ref 0.0–0.1)
EOS%: 0 % (ref 0.0–7.0)
Eosinophils Absolute: 0 10*3/uL (ref 0.0–0.5)
HEMATOCRIT: 30.4 % — AB (ref 34.8–46.6)
HGB: 10.3 g/dL — ABNORMAL LOW (ref 11.6–15.9)
LYMPH#: 1.6 10*3/uL (ref 0.9–3.3)
LYMPH%: 34.1 % (ref 14.0–49.7)
MCH: 34.9 pg — ABNORMAL HIGH (ref 25.1–34.0)
MCHC: 33.7 g/dL (ref 31.5–36.0)
MCV: 103.5 fL — ABNORMAL HIGH (ref 79.5–101.0)
MONO#: 0.6 10*3/uL (ref 0.1–0.9)
MONO%: 12.4 % (ref 0.0–14.0)
NEUT#: 2.6 10*3/uL (ref 1.5–6.5)
NEUT%: 53.2 % (ref 38.4–76.8)
PLATELETS: 240 10*3/uL (ref 145–400)
RBC: 2.94 10*6/uL — ABNORMAL LOW (ref 3.70–5.45)
RDW: 17.9 % — AB (ref 11.2–14.5)
WBC: 4.8 10*3/uL (ref 3.9–10.3)

## 2016-02-13 LAB — COMPREHENSIVE METABOLIC PANEL
ALBUMIN: 3.6 g/dL (ref 3.5–5.0)
ALK PHOS: 75 U/L (ref 40–150)
ALT: 18 U/L (ref 0–55)
AST: 15 U/L (ref 5–34)
Anion Gap: 8 mEq/L (ref 3–11)
BUN: 10.5 mg/dL (ref 7.0–26.0)
CALCIUM: 9.6 mg/dL (ref 8.4–10.4)
CO2: 28 mEq/L (ref 22–29)
CREATININE: 0.7 mg/dL (ref 0.6–1.1)
Chloride: 107 mEq/L (ref 98–109)
EGFR: >90 mL/min/{1.73_m2} (ref >90)
Glucose: 92 mg/dl (ref 70–140)
Potassium: 4.9 mEq/L (ref 3.5–5.1)
Sodium: 143 mEq/L (ref 136–145)
Total Bilirubin: 0.37 mg/dL (ref 0.20–1.20)
Total Protein: 6.6 g/dL (ref 6.4–8.3)

## 2016-02-13 MED ORDER — SODIUM CHLORIDE 0.9 % IV SOLN
Freq: Once | INTRAVENOUS | Status: AC
  Administered 2016-02-13: 10:00:00 via INTRAVENOUS
  Filled 2016-02-13: qty 5

## 2016-02-13 MED ORDER — DOCETAXEL CHEMO INJECTION 160 MG/16ML
65.0000 mg/m2 | Freq: Once | INTRAVENOUS | Status: AC
  Administered 2016-02-13: 120 mg via INTRAVENOUS
  Filled 2016-02-13: qty 12

## 2016-02-13 MED ORDER — ACETAMINOPHEN 325 MG PO TABS
650.0000 mg | ORAL_TABLET | Freq: Once | ORAL | Status: AC
  Administered 2016-02-13: 650 mg via ORAL

## 2016-02-13 MED ORDER — DIPHENHYDRAMINE HCL 25 MG PO CAPS
50.0000 mg | ORAL_CAPSULE | Freq: Once | ORAL | Status: AC
Start: 2016-02-13 — End: 2016-02-13
  Administered 2016-02-13: 50 mg via ORAL

## 2016-02-13 MED ORDER — PALONOSETRON HCL INJECTION 0.25 MG/5ML
0.2500 mg | Freq: Once | INTRAVENOUS | Status: AC
  Administered 2016-02-13: 0.25 mg via INTRAVENOUS

## 2016-02-13 MED ORDER — ACETAMINOPHEN 325 MG PO TABS
ORAL_TABLET | ORAL | Status: AC
  Filled 2016-02-13: qty 2

## 2016-02-13 MED ORDER — HEPARIN SOD (PORK) LOCK FLUSH 100 UNIT/ML IV SOLN
500.0000 [IU] | Freq: Once | INTRAVENOUS | Status: AC | PRN
  Administered 2016-02-13: 500 [IU]
  Filled 2016-02-13: qty 5

## 2016-02-13 MED ORDER — TRASTUZUMAB CHEMO 150 MG IV SOLR
6.0000 mg/kg | Freq: Once | INTRAVENOUS | Status: AC
  Administered 2016-02-13: 441 mg via INTRAVENOUS
  Filled 2016-02-13: qty 21

## 2016-02-13 MED ORDER — CARBOPLATIN CHEMO INJECTION 600 MG/60ML
478.8000 mg | Freq: Once | INTRAVENOUS | Status: AC
  Administered 2016-02-13: 480 mg via INTRAVENOUS
  Filled 2016-02-13: qty 48

## 2016-02-13 MED ORDER — PEGFILGRASTIM 6 MG/0.6ML ~~LOC~~ PSKT
6.0000 mg | PREFILLED_SYRINGE | Freq: Once | SUBCUTANEOUS | Status: AC
  Administered 2016-02-13: 6 mg via SUBCUTANEOUS
  Filled 2016-02-13: qty 0.6

## 2016-02-13 MED ORDER — PALONOSETRON HCL INJECTION 0.25 MG/5ML
INTRAVENOUS | Status: AC
  Filled 2016-02-13: qty 5

## 2016-02-13 MED ORDER — SODIUM CHLORIDE 0.9% FLUSH
10.0000 mL | INTRAVENOUS | Status: DC | PRN
  Administered 2016-02-13: 10 mL
  Filled 2016-02-13: qty 10

## 2016-02-13 MED ORDER — SODIUM CHLORIDE 0.9 % IV SOLN
Freq: Once | INTRAVENOUS | Status: AC
  Administered 2016-02-13: 10:00:00 via INTRAVENOUS

## 2016-02-13 MED ORDER — DIPHENHYDRAMINE HCL 25 MG PO CAPS
ORAL_CAPSULE | ORAL | Status: AC
  Filled 2016-02-13: qty 2

## 2016-02-13 NOTE — Assessment & Plan Note (Signed)
Left lumpectomy 09/25/2015: IDC with DCIS, 1.2 cm, margins negative, 0/4 lymph nodes negative, grade 3, ER 70%, PR 5%, HER-2 positive ratio 3.45, Ki-67 10%, T1 CN 0 stage IA  Treatment plan: 1. Adjuvant chemotherapy with [place] 6 cyclesfollowed by Herceptin every 3 weeks for 1 year 2. Followed by adjuvant radiation 3. Followed by adjuvant antiestrogen therapy ---------------------------------------------------------------------------------------------------------------------------------- Current Treatment: TCH cycle 6day 1 Echocardiogram 10/06/2015: EF 55-60%  Chemotherapy toxicities: 1. Nauseaand vomiting grade 2 2. fatigue grade 2: First week after chemotherapy fatigue is really 3. Diarrhea grade 2: Improved 4. Sinus drainage and dizziness: Previously treated with amoxicillin for 1 week 5. Dehydration: She previously required IV fluids 6. Bone pain due to Neulasta: Improved with Claritin 7. Chemotherapy-induced anemia: I decreased the dosage of cycle 3 Taxotere to 65 mg/m 8. Chemotherapy-induced cytopenia: I reduced the dosage of carboplatin to AUC 4 We will have her come in for 500 mL of fluids next week.  Plan: Patient will start adjuvant radiation therapy. She will continue Herceptin every 3 weeks Return to clinic in 6 weeks for follow-up on Herceptin.

## 2016-02-13 NOTE — Patient Instructions (Signed)
[facility] Discharge Instructions for Patients Receiving Chemotherapy  Today you received the following chemotherapy agents Herceptin, Taxotere and Carboplatin   To help prevent nausea and vomiting after your treatment, we encourage you to take your nausea medication as directed. No Zofran for 3 days. Take Compazine instead.    If you develop nausea and vomiting that is not controlled by your nausea medication, call the clinic.   BELOW ARE SYMPTOMS THAT SHOULD BE REPORTED IMMEDIATELY:  *FEVER GREATER THAN 100.5 F  *CHILLS WITH OR WITHOUT FEVER  NAUSEA AND VOMITING THAT IS NOT CONTROLLED WITH YOUR NAUSEA MEDICATION  *UNUSUAL SHORTNESS OF BREATH  *UNUSUAL BRUISING OR BLEEDING  TENDERNESS IN MOUTH AND THROAT WITH OR WITHOUT PRESENCE OF ULCERS  *URINARY PROBLEMS  *BOWEL PROBLEMS  UNUSUAL RASH Items with * indicate a potential emergency and should be followed up as soon as possible.  Feel free to call the clinic you have any questions or concerns. The clinic phone number is [phone].  Please show the CHEMO ALERT CARD at check-in to the Emergency Department and triage nurse.   

## 2016-02-13 NOTE — Progress Notes (Signed)
Patient Care Team: Donella Stade, PA-C as PCP - General (Family Medicine) Erroll Luna, MD as Consulting Physician (General Surgery) Nicholas Lose, MD as Consulting Physician (Hematology and Oncology) Gery Pray, MD as Consulting Physician (Radiation Oncology)  DIAGNOSIS:  Encounter Diagnosis  Name Primary?  . Malignant neoplasm of lower-outer quadrant of left breast of female, estrogen receptor positive ([place])     SUMMARY OF ONCOLOGIC HISTORY:   Breast cancer of lower-outer quadrant of left female breast ([place])   09/10/2015 Initial Diagnosis    Screening detected left breast asymmetry: 2 adjacent masses 4:00: 8 mm and 3:30: 9 mm; axilla negative, biopsy grade 2-3 IDC, ER 70%, PR 5%, Ki-67 10%, HER-2 positive ratio 3.41, T1BN0 stage IA      09/25/2015 Surgery    Left lumpectomy: IDC with DCIS, 1.2 cm, margins negative, 0/4 lymph nodes negative, grade 3, ER 70%, PR 5%, HER-2 positive ratio 3.45, Ki-67 10%, T1 CN 0 stage IA      10/31/2015 - 02/13/2016 Chemotherapy    Adjuvant chemotherapy with TCH 6 cycles followed by Herceptin maintenance for 1 year       CHIEF COMPLIANT: Cycle 6 TCH  INTERVAL HISTORY: Tehila Sokolow is a 56 year old with above-mentioned history left breast cancer treated with lumpectomy and is currently on adjuvant chemotherapy. Today is cycle 6 of [place]. As of the last cycle of treatment. She is very excited about this. This last chemotherapy she tolerated it very well. She did not have any major limitation of her activity. She had been drinking enough water. Denied any diarrhea. Slight change in taste after chemotherapy.  REVIEW OF SYSTEMS:   Constitutional: Denies fevers, chills or abnormal weight loss Eyes: Denies blurriness of vision Ears, nose, mouth, throat, and face: Denies mucositis or sore throat Respiratory: Denies cough, dyspnea or wheezes Cardiovascular: Denies palpitation, chest discomfort Gastrointestinal:  Denies nausea, heartburn or change in  bowel habits Skin: Denies abnormal skin rashes Lymphatics: Denies new lymphadenopathy or easy bruising Neurological:Denies numbness, tingling or new weaknesses Behavioral/Psych: Mood is stable, no new changes  Extremities: No lower extremity edema  All other systems were reviewed with the patient and are negative.  I have reviewed the past medical history, past surgical history, social history and family history with the patient and they are unchanged from previous note.  ALLERGIES:  is allergic to sulfa antibiotics.  MEDICATIONS:  Current Outpatient Prescriptions  Medication Sig Dispense Refill  . Albuterol Sulfate (PROAIR RESPICLICK) 884 (90 BASE) MCG/ACT AEPB Inhale 2 puffs into the lungs every 6 (six) hours. (Patient not taking: Reported on 10/06/2015) 3 each 3  . dexamethasone (DECADRON) 4 MG tablet Take 1 tablet (4 mg total) by mouth 2 (two) times daily. Start the day before Taxotere. Then again the day after chemo for 1 day 30 tablet 1  . dicyclomine (BENTYL) 20 MG tablet Take 1 tablet (20 mg total) by mouth every 6 (six) hours. As needed for cramps in abdomen (Patient not taking: Reported on 12/10/2015) 30 tablet 0  . levothyroxine (SYNTHROID, LEVOTHROID) 50 MCG tablet TAKE 1 TABLET (50 MCG TOTAL) BY MOUTH DAILY. NEEDS LAB WORK 15 tablet 0  . lidocaine-prilocaine (EMLA) cream Apply to affected area once 30 g 3  . liothyronine (CYTOMEL) 25 MCG tablet Take 1 tablet (25 mcg total) by mouth daily. 30 tablet 0  . LORazepam (ATIVAN) 0.5 MG tablet Take 1 tablet (0.5 mg total) by mouth at bedtime. 30 tablet 0  . omeprazole (PRILOSEC) 20 MG capsule Take 1  capsule (20 mg total) by mouth 2 (two) times daily before a meal. 180 capsule 4  . ondansetron (ZOFRAN) 8 MG tablet TAKE 1 TABLET BY MOUTH TWICE A DAY AS NEEDED FOR NAUSEA/VOMITING. START 3 DAYS AFTER CHEMO 30 tablet 1  . oxyCODONE-acetaminophen (ROXICET) 5-325 MG tablet Take 1-2 tablets by mouth every 4 (four) hours as needed. (Patient not  taking: Reported on 12/10/2015) 30 tablet 0  . prochlorperazine (COMPAZINE) 10 MG tablet Take 1 tablet (10 mg total) by mouth every 6 (six) hours as needed (Nausea or vomiting). 30 tablet 1   No current facility-administered medications for this visit.     PHYSICAL EXAMINATION: ECOG PERFORMANCE STATUS: 1 - Symptomatic but completely ambulatory  Vitals:   02/13/16 0858  BP: (!) 143/77  Pulse: 91  Resp: 18  Temp: 98 F (36.7 C)   Filed Weights   02/13/16 0858  Weight: 168 lb 14.4 oz (76.6 kg)    GENERAL:alert, no distress and comfortable SKIN: skin color, texture, turgor are normal, no rashes or significant lesions EYES: normal, Conjunctiva are pink and non-injected, sclera clear OROPHARYNX:no exudate, no erythema and lips, buccal mucosa, and tongue normal  NECK: supple, thyroid normal size, non-tender, without nodularity LYMPH:  no palpable lymphadenopathy in the cervical, axillary or inguinal LUNGS: clear to auscultation and percussion with normal breathing effort HEART: regular rate & rhythm and no murmurs and no lower extremity edema ABDOMEN:abdomen soft, non-tender and normal bowel sounds MUSCULOSKELETAL:no cyanosis of digits and no clubbing  NEURO: alert & oriented x 3 with fluent speech, no focal motor/sensory deficits EXTREMITIES: No lower extremity edema  LABORATORY DATA:  I have reviewed the data as listed   Chemistry      Component Value Date/Time   NA 142 01/23/2016 0806   K 4.7 01/23/2016 0806   CL 102 08/12/2015 0959   CO2 24 01/23/2016 0806   BUN 15.7 01/23/2016 0806   CREATININE 0.7 01/23/2016 0806      Component Value Date/Time   CALCIUM 9.7 01/23/2016 0806   ALKPHOS 73 01/23/2016 0806   AST 15 01/23/2016 0806   ALT 17 01/23/2016 0806   BILITOT 0.51 01/23/2016 0806       Lab Results  Component Value Date   WBC 4.8 02/13/2016   HGB 10.3 (L) 02/13/2016   HCT 30.4 (L) 02/13/2016   MCV 103.5 (H) 02/13/2016   PLT 240 02/13/2016   NEUTROABS 2.6  02/13/2016    ASSESSMENT & PLAN:  Breast cancer of lower-outer quadrant of left female breast ([place]) Left lumpectomy 09/25/2015: IDC with DCIS, 1.2 cm, margins negative, 0/4 lymph nodes negative, grade 3, ER 70%, PR 5%, HER-2 positive ratio 3.45, Ki-67 10%, T1 CN 0 stage IA  Treatment plan: 1. Adjuvant chemotherapy with [place] 6 cyclesfollowed by Herceptin every 3 weeks for 1 year 2. Followed by adjuvant radiation 3. Followed by adjuvant antiestrogen therapy ---------------------------------------------------------------------------------------------------------------------------------- Current Treatment: TCH cycle 6day 1 Echocardiogram 10/06/2015: EF 55-60%  Chemotherapy toxicities: 1. Nauseaand vomiting grade 2 2. fatigue grade 2: First week after chemotherapy fatigue is really 3. Diarrhea grade 2: Improved 4. Sinus drainage and dizziness: Previously treated with amoxicillin for 1 week 5. Dehydration: She previously required IV fluids 6. Bone pain due to Neulasta: Improved with Claritin 7. Chemotherapy-induced anemia: I decreased the dosage of cycle 3 Taxotere to 65 mg/m 8. Chemotherapy-induced cytopenia: I reduced the dosage of carboplatin to AUC 4  Plan: Patient will start adjuvant radiation therapy. She will continue Herceptin every 3 weeks Return  to clinic in 6 weeks for follow-up on Herceptin.  I spent 25 minutes talking to the patient of which more than half was spent in counseling and coordination of care.  No orders of the defined types were placed in this encounter.  The patient has a good understanding of the overall plan. she agrees with it. she will call with any problems that may develop before the next visit here.   Rulon Eisenmenger, MD 02/13/16

## 2016-02-21 ENCOUNTER — Other Ambulatory Visit: Payer: Self-pay | Admitting: Physician Assistant

## 2016-02-21 ENCOUNTER — Encounter: Payer: Self-pay | Admitting: Physician Assistant

## 2016-02-21 DIAGNOSIS — E063 Autoimmune thyroiditis: Secondary | ICD-10-CM

## 2016-02-22 ENCOUNTER — Encounter: Payer: Self-pay | Admitting: Hematology and Oncology

## 2016-02-23 ENCOUNTER — Other Ambulatory Visit: Payer: Self-pay

## 2016-02-23 DIAGNOSIS — E063 Autoimmune thyroiditis: Secondary | ICD-10-CM

## 2016-02-24 ENCOUNTER — Other Ambulatory Visit: Payer: Self-pay

## 2016-02-24 MED ORDER — DULOXETINE HCL 60 MG PO CPEP: 60.0000 mg | ORAL_CAPSULE | Freq: Every day | ORAL | 3 refills | Status: DC

## 2016-02-24 NOTE — Progress Notes (Signed)
Pt emailed regarding requesting to have duloxetine back in her med list. Pt still takes medication and for some reason was discontinued and reported as not taken on 02/10/16. Pt confirms that she still takes medication. Will add on her medication list. Pt very appreciative.

## 2016-03-02 ENCOUNTER — Ambulatory Visit
Admission: RE | Admit: 2016-03-02 | Discharge: 2016-03-02 | Disposition: A | Discharge status: Home / Self Care | Payer: BLUE CROSS/BLUE SHIELD | Source: Ambulatory Visit | Attending: Radiation Oncology | Admitting: Radiation Oncology

## 2016-03-02 DIAGNOSIS — K219 Gastro-esophageal reflux disease without esophagitis: Secondary | ICD-10-CM | POA: Diagnosis not present

## 2016-03-02 DIAGNOSIS — J45909 Unspecified asthma, uncomplicated: Secondary | ICD-10-CM | POA: Diagnosis not present

## 2016-03-02 DIAGNOSIS — K589 Irritable bowel syndrome without diarrhea: Secondary | ICD-10-CM | POA: Diagnosis not present

## 2016-03-02 DIAGNOSIS — Z87891 Personal history of nicotine dependence: Secondary | ICD-10-CM | POA: Diagnosis not present

## 2016-03-02 DIAGNOSIS — E063 Autoimmune thyroiditis: Secondary | ICD-10-CM | POA: Diagnosis not present

## 2016-03-02 DIAGNOSIS — Z170 Estrogen receptor positive status [ER+]: Secondary | ICD-10-CM | POA: Diagnosis not present

## 2016-03-02 DIAGNOSIS — C50512 Malignant neoplasm of lower-outer quadrant of left female breast: Secondary | ICD-10-CM | POA: Diagnosis not present

## 2016-03-02 DIAGNOSIS — Z9889 Other specified postprocedural states: Secondary | ICD-10-CM | POA: Diagnosis not present

## 2016-03-02 DIAGNOSIS — F419 Anxiety disorder, unspecified: Secondary | ICD-10-CM | POA: Diagnosis not present

## 2016-03-02 DIAGNOSIS — Z853 Personal history of malignant neoplasm of breast: Secondary | ICD-10-CM | POA: Diagnosis not present

## 2016-03-02 DIAGNOSIS — Z510 Encounter for antineoplastic radiation therapy: Secondary | ICD-10-CM | POA: Diagnosis not present

## 2016-03-02 DIAGNOSIS — C50912 Malignant neoplasm of unspecified site of left female breast: Secondary | ICD-10-CM | POA: Diagnosis not present

## 2016-03-02 NOTE — Progress Notes (Signed)
  Radiation Oncology         [phone]) [phone] ________________________________  Name: Jenna [name] MRN: [id number]  Date: 03/02/2016  DOB: 06-Aug-1960  SIMULATION AND TREATMENT PLANNING NOTE   DIAGNOSIS: Stage IA (pT1c, pN0) grade 3 invasive ductal carcinoma of the left breast (triple positive)  NARRATIVE:  The patient was brought to the [facility].  Identity was confirmed.  All relevant records and images related to the planned course of therapy were reviewed.  The patient freely provided informed written consent to proceed with treatment after reviewing the details related to the planned course of therapy. The consent form was witnessed and verified by the simulation staff.  Then, the patient was set-up in a stable reproducible  supine position for radiation therapy.  CT images were obtained.  Surface markings were placed.  The CT images were loaded into the planning software.  Then the target and avoidance structures were contoured.  Treatment planning then occurred.  The radiation prescription was entered and confirmed.  Then, I designed and supervised the construction of a total of 3 medically necessary complex treatment devices.  I have requested : 3D Simulation  I have requested a DVH of the following structures: heart, lungs, lumpectomy cavity.  I have ordered:dose calc.   PLAN:  The patient will receive 50.4 Gy in 28 fractions followed by a boost to the lumpectomy cavity of 10 gray for a cumulative dose of 60.4 Gy.  -----------------------------------  Blair Promise, PhD, MD  This document serves as a record of services personally performed by Gery Pray, MD. It was created on his behalf by Darcus Austin, a trained medical scribe. The creation of this record is based on the scribe's personal observations and the provider's statements to them. This document has been checked and approved by the attending provider.

## 2016-03-05 ENCOUNTER — Ambulatory Visit (HOSPITAL_BASED_OUTPATIENT_CLINIC_OR_DEPARTMENT_OTHER): Payer: BLUE CROSS/BLUE SHIELD

## 2016-03-05 ENCOUNTER — Other Ambulatory Visit (HOSPITAL_BASED_OUTPATIENT_CLINIC_OR_DEPARTMENT_OTHER): Payer: BLUE CROSS/BLUE SHIELD

## 2016-03-05 VITALS — BP 142/85 | HR 84 | Temp 98.6°F | Resp 20

## 2016-03-05 DIAGNOSIS — C50512 Malignant neoplasm of lower-outer quadrant of left female breast: Secondary | ICD-10-CM

## 2016-03-05 DIAGNOSIS — Z5112 Encounter for antineoplastic immunotherapy: Secondary | ICD-10-CM

## 2016-03-05 DIAGNOSIS — Z170 Estrogen receptor positive status [ER+]: Principal | ICD-10-CM

## 2016-03-05 LAB — COMPREHENSIVE METABOLIC PANEL
ALBUMIN: 3.7 g/dL (ref 3.5–5.0)
ALK PHOS: 71 U/L (ref 40–150)
ALT: 13 U/L (ref 0–55)
AST: 13 U/L (ref 5–34)
Anion Gap: 8 mEq/L (ref 3–11)
BUN: 21 mg/dL (ref 7.0–26.0)
CO2: 27 mEq/L (ref 22–29)
Calcium: 9.5 mg/dL (ref 8.4–10.4)
Chloride: 107 mEq/L (ref 98–109)
Creatinine: 0.8 mg/dL (ref 0.6–1.1)
GLUCOSE: 90 mg/dL (ref 70–140)
POTASSIUM: 5.1 meq/L (ref 3.5–5.1)
Sodium: 142 mEq/L (ref 136–145)
Total Bilirubin: 0.36 mg/dL (ref 0.20–1.20)
Total Protein: 6.7 g/dL (ref 6.4–8.3)

## 2016-03-05 LAB — CBC WITH DIFFERENTIAL/PLATELET
BASO%: 0.2 % (ref 0.0–2.0)
BASOS ABS: 0 10*3/uL (ref 0.0–0.1)
EOS ABS: 0 10*3/uL (ref 0.0–0.5)
EOS%: 0 % (ref 0.0–7.0)
HCT: 34.5 % — ABNORMAL LOW (ref 34.8–46.6)
HGB: 11.3 g/dL — ABNORMAL LOW (ref 11.6–15.9)
LYMPH%: 28.1 % (ref 14.0–49.7)
MCH: 34.2 pg — AB (ref 25.1–34.0)
MCHC: 32.8 g/dL (ref 31.5–36.0)
MCV: 104.5 fL — AB (ref 79.5–101.0)
MONO#: 0.5 10*3/uL (ref 0.1–0.9)
MONO%: 13.2 % (ref 0.0–14.0)
NEUT#: 2.4 10*3/uL (ref 1.5–6.5)
NEUT%: 58.5 % (ref 38.4–76.8)
Platelets: 149 10*3/uL (ref 145–400)
RBC: 3.3 10*6/uL — AB (ref 3.70–5.45)
RDW: 14.3 % (ref 11.2–14.5)
WBC: 4.1 10*3/uL (ref 3.9–10.3)
lymph#: 1.2 10*3/uL (ref 0.9–3.3)

## 2016-03-05 MED ORDER — HEPARIN SOD (PORK) LOCK FLUSH 100 UNIT/ML IV SOLN
500.0000 [IU] | Freq: Once | INTRAVENOUS | Status: AC | PRN
  Administered 2016-03-05: 500 [IU]
  Filled 2016-03-05: qty 5

## 2016-03-05 MED ORDER — SODIUM CHLORIDE 0.9 % IV SOLN
6.0000 mg/kg | Freq: Once | INTRAVENOUS | Status: AC
  Administered 2016-03-05: 441 mg via INTRAVENOUS
  Filled 2016-03-05: qty 21

## 2016-03-05 MED ORDER — ACETAMINOPHEN 325 MG PO TABS
ORAL_TABLET | ORAL | Status: AC
  Filled 2016-03-05: qty 2

## 2016-03-05 MED ORDER — SODIUM CHLORIDE 0.9 % IV SOLN
Freq: Once | INTRAVENOUS | Status: AC
  Administered 2016-03-05: 10:00:00 via INTRAVENOUS

## 2016-03-05 MED ORDER — DIPHENHYDRAMINE HCL 25 MG PO CAPS
ORAL_CAPSULE | ORAL | Status: AC
  Filled 2016-03-05: qty 2

## 2016-03-05 MED ORDER — SODIUM CHLORIDE 0.9% FLUSH
10.0000 mL | INTRAVENOUS | Status: DC | PRN
  Administered 2016-03-05: 10 mL
  Filled 2016-03-05: qty 10

## 2016-03-05 MED ORDER — DIPHENHYDRAMINE HCL 25 MG PO CAPS
50.0000 mg | ORAL_CAPSULE | Freq: Once | ORAL | Status: AC
Start: 2016-03-05 — End: 2016-03-05
  Administered 2016-03-05: 50 mg via ORAL

## 2016-03-05 MED ORDER — ACETAMINOPHEN 325 MG PO TABS
650.0000 mg | ORAL_TABLET | Freq: Once | ORAL | Status: AC
  Administered 2016-03-05: 650 mg via ORAL

## 2016-03-05 NOTE — Patient Instructions (Signed)
[facility] Discharge Instructions for Patients Receiving Chemotherapy  Today you received the following chemotherapy agents: Herceptin   To help prevent nausea and vomiting after your treatment, we encourage you to take your nausea medication as directed.    If you develop nausea and vomiting that is not controlled by your nausea medication, call the clinic.   BELOW ARE SYMPTOMS THAT SHOULD BE REPORTED IMMEDIATELY:  *FEVER GREATER THAN 100.5 F  *CHILLS WITH OR WITHOUT FEVER  NAUSEA AND VOMITING THAT IS NOT CONTROLLED WITH YOUR NAUSEA MEDICATION  *UNUSUAL SHORTNESS OF BREATH  *UNUSUAL BRUISING OR BLEEDING  TENDERNESS IN MOUTH AND THROAT WITH OR WITHOUT PRESENCE OF ULCERS  *URINARY PROBLEMS  *BOWEL PROBLEMS  UNUSUAL RASH Items with * indicate a potential emergency and should be followed up as soon as possible.  Feel free to call the clinic you have any questions or concerns. The clinic phone number is [phone].  Please show the CHEMO ALERT CARD at check-in to the Emergency Department and triage nurse.   

## 2016-03-09 ENCOUNTER — Ambulatory Visit
Admission: RE | Admit: 2016-03-09 | Discharge: 2016-03-09 | Disposition: A | Discharge status: Home / Self Care | Payer: BLUE CROSS/BLUE SHIELD | Source: Ambulatory Visit | Attending: Radiation Oncology | Admitting: Radiation Oncology

## 2016-03-09 DIAGNOSIS — Z170 Estrogen receptor positive status [ER+]: Secondary | ICD-10-CM | POA: Diagnosis not present

## 2016-03-09 DIAGNOSIS — C50512 Malignant neoplasm of lower-outer quadrant of left female breast: Secondary | ICD-10-CM | POA: Diagnosis not present

## 2016-03-09 NOTE — Progress Notes (Signed)
  Radiation Oncology         [phone]) [phone] ________________________________  Name: Jenna [name] MRN: [id number]  Date: 03/09/2016  DOB: 1960-10-16  Simulation Verification Note    ICD-9-CM ICD-10-CM   1. Malignant neoplasm of lower-outer quadrant of left breast of female, estrogen receptor positive ([place]) 174.5 C50.512    V86.0 Z17.0     Status: outpatient  NARRATIVE: The patient was brought to the treatment unit and placed in the planned treatment position. The clinical setup was verified. Then port films were obtained and uploaded to the radiation oncology medical record software.  The treatment beams were carefully compared against the planned radiation fields. The position location and shape of the radiation fields was reviewed. They targeted volume of tissue appears to be appropriately covered by the radiation beams. Organs at risk appear to be excluded as planned.  Based on my personal review, I approved the simulation verification. The patient's treatment will proceed as planned.  -----------------------------------  Blair Promise, PhD, MD

## 2016-03-10 ENCOUNTER — Ambulatory Visit
Admission: RE | Admit: 2016-03-10 | Discharge: 2016-03-10 | Disposition: A | Discharge status: Home / Self Care | Payer: BLUE CROSS/BLUE SHIELD | Source: Ambulatory Visit | Attending: Radiation Oncology | Admitting: Radiation Oncology

## 2016-03-10 DIAGNOSIS — Z170 Estrogen receptor positive status [ER+]: Secondary | ICD-10-CM | POA: Diagnosis not present

## 2016-03-10 DIAGNOSIS — C50512 Malignant neoplasm of lower-outer quadrant of left female breast: Secondary | ICD-10-CM | POA: Diagnosis not present

## 2016-03-11 ENCOUNTER — Ambulatory Visit
Admission: RE | Admit: 2016-03-11 | Discharge: 2016-03-11 | Disposition: A | Discharge status: Home / Self Care | Payer: BLUE CROSS/BLUE SHIELD | Source: Ambulatory Visit | Attending: Radiation Oncology | Admitting: Radiation Oncology

## 2016-03-11 DIAGNOSIS — Z170 Estrogen receptor positive status [ER+]: Secondary | ICD-10-CM | POA: Diagnosis not present

## 2016-03-11 DIAGNOSIS — C50512 Malignant neoplasm of lower-outer quadrant of left female breast: Secondary | ICD-10-CM | POA: Diagnosis not present

## 2016-03-12 ENCOUNTER — Ambulatory Visit
Admission: RE | Admit: 2016-03-12 | Discharge: 2016-03-12 | Disposition: A | Discharge status: Home / Self Care | Payer: BLUE CROSS/BLUE SHIELD | Source: Ambulatory Visit | Attending: Radiation Oncology | Admitting: Radiation Oncology

## 2016-03-12 DIAGNOSIS — Z170 Estrogen receptor positive status [ER+]: Secondary | ICD-10-CM | POA: Diagnosis not present

## 2016-03-12 DIAGNOSIS — C50512 Malignant neoplasm of lower-outer quadrant of left female breast: Secondary | ICD-10-CM | POA: Diagnosis not present

## 2016-03-13 ENCOUNTER — Encounter: Payer: Self-pay | Admitting: Hematology and Oncology

## 2016-03-14 ENCOUNTER — Encounter: Payer: Self-pay | Admitting: Physician Assistant

## 2016-03-15 ENCOUNTER — Ambulatory Visit
Admission: RE | Admit: 2016-03-15 | Discharge: 2016-03-15 | Disposition: A | Discharge status: Home / Self Care | Payer: BLUE CROSS/BLUE SHIELD | Source: Ambulatory Visit | Attending: Radiation Oncology | Admitting: Radiation Oncology

## 2016-03-15 DIAGNOSIS — Z170 Estrogen receptor positive status [ER+]: Secondary | ICD-10-CM | POA: Diagnosis not present

## 2016-03-15 DIAGNOSIS — C50512 Malignant neoplasm of lower-outer quadrant of left female breast: Secondary | ICD-10-CM | POA: Diagnosis not present

## 2016-03-16 ENCOUNTER — Inpatient Hospital Stay
Admission: RE | Admit: 2016-03-16 | Discharge: 2016-03-16 | Disposition: A | Discharge status: Home / Self Care | Payer: BLUE CROSS/BLUE SHIELD | Source: Ambulatory Visit | Attending: Radiation Oncology | Admitting: Radiation Oncology

## 2016-03-16 ENCOUNTER — Ambulatory Visit
Admission: RE | Admit: 2016-03-16 | Discharge: 2016-03-16 | Disposition: A | Discharge status: Home / Self Care | Payer: BLUE CROSS/BLUE SHIELD | Source: Ambulatory Visit | Attending: Radiation Oncology | Admitting: Radiation Oncology

## 2016-03-16 ENCOUNTER — Encounter: Payer: Self-pay | Admitting: Radiation Oncology

## 2016-03-16 ENCOUNTER — Ambulatory Visit: Payer: BLUE CROSS/BLUE SHIELD

## 2016-03-16 ENCOUNTER — Inpatient Hospital Stay
Admission: RE | Admit: 2016-03-16 | Discharge: 2016-03-16 | Disposition: A | Discharge status: Home / Self Care | Payer: Self-pay | Source: Ambulatory Visit | Attending: Radiation Oncology | Admitting: Radiation Oncology

## 2016-03-16 VITALS — BP 142/84 | HR 86 | Temp 98.6°F | Ht 62.0 in | Wt 169.8 lb

## 2016-03-16 DIAGNOSIS — C50512 Malignant neoplasm of lower-outer quadrant of left female breast: Secondary | ICD-10-CM

## 2016-03-16 DIAGNOSIS — Z170 Estrogen receptor positive status [ER+]: Secondary | ICD-10-CM | POA: Diagnosis not present

## 2016-03-16 MED ORDER — RADIAPLEXRX EX GEL
Freq: Once | CUTANEOUS | Status: AC
  Administered 2016-03-16: 17:00:00 via TOPICAL

## 2016-03-16 NOTE — Progress Notes (Signed)
Pt here for patient teaching.  Pt given Radiation and You booklet, skin care instructions and Radiaplex gel.  Reviewed areas of pertinence such as fatigue, skin changes, breast tenderness and breast swelling . Pt able to give teach back of to pat skin and use unscented/gentle soap,apply Radiaplex bid and avoid applying anything to skin within 4 hours of treatment. Pt demonstrated understanding and verbalizes understanding of information given and will contact nursing with any questions or concerns.

## 2016-03-16 NOTE — Progress Notes (Signed)
  Radiation Oncology         [phone]) [phone] ________________________________  Name: Jenna [name] MRN: [id number]  Date: 03/16/2016  DOB: 05-25-1960  Weekly Radiation Therapy Management    ICD-9-CM ICD-10-CM   1. Malignant neoplasm of lower-outer quadrant of left breast of female, estrogen receptor positive ([place]) 174.5 C50.512    V86.0 Z17.0      Current Dose: 9 Gy     Planned Dose:  50.4 Gy  Narrative . . . . . . . . The patient presents for routine under treatment assessment.     The patient has completed 5 fractions to the left breast. She denies any pain except for occasional stinging pains in her breast. She denies an increase in fatigue. She asks if she could use lanolin cream to the skin in the treatment area.                                    The patient is without complaint.                                 Set-up films were reviewed.                                 The chart was checked. Physical Findings. . .  height is 5\' 2"  (1.575 m) and weight is 169 lb 12.8 oz (77 kg). Her oral temperature is 98.6 F (37 C). Her blood pressure is 142/84 (abnormal) and her pulse is 86. Her oxygen saturation is 98%.  Weight essentially stable.  No significant changes. Lungs are clear to auscultation bilaterally. Heart is regular in rate and rhythm. Slight hyperpigmentation changes. Impression . . . . . . . The patient is tolerating radiation. Plan . . . . . . . . . . . . Continue treatment as planned. I advised the patient to use Radiaplex provided by nursing, and to use lanolin following completion of treatment if she wished.  ________________________________   Blair Promise, PhD, MD  This document serves as a record of services personally performed by Gery Pray, MD. It was created on his behalf by Maryla Morrow, a trained medical scribe. The creation of this record is based on the scribe's personal observations and the provider's statements to them. This document has been checked  and approved by the attending provider.

## 2016-03-16 NOTE — Progress Notes (Addendum)
Jenna [name] has completed 5 fractions to her left breast.  She denies having any pain except for occasional stinging pains in her left breast.  She denies having an increase in fatigue.  The skin on her left breast is intact.  She is wondering if she should use lanolin cream.  BP (!) 142/84 (BP Location: Right Arm, Patient Position: Sitting)   Pulse 86   Temp 98.6 F (37 C) (Oral)   Ht 5\' 2"  (1.575 m)   Wt 169 lb 12.8 oz (77 kg)   SpO2 98%   BMI 31.06 kg/m    Wt Readings from Last 3 Encounters:  03/16/16 169 lb 12.8 oz (77 kg)  02/13/16 168 lb 14.4 oz (76.6 kg)  02/10/16 168 lb 8 oz (76.4 kg)

## 2016-03-17 ENCOUNTER — Encounter: Payer: BLUE CROSS/BLUE SHIELD | Admitting: Radiation Oncology

## 2016-03-17 ENCOUNTER — Ambulatory Visit
Admission: RE | Admit: 2016-03-17 | Discharge: 2016-03-17 | Disposition: A | Discharge status: Home / Self Care | Payer: BLUE CROSS/BLUE SHIELD | Source: Ambulatory Visit | Attending: Radiation Oncology | Admitting: Radiation Oncology

## 2016-03-17 DIAGNOSIS — C50512 Malignant neoplasm of lower-outer quadrant of left female breast: Secondary | ICD-10-CM | POA: Diagnosis not present

## 2016-03-17 DIAGNOSIS — Z170 Estrogen receptor positive status [ER+]: Secondary | ICD-10-CM | POA: Diagnosis not present

## 2016-03-17 MED ORDER — RADIAPLEXRX EX GEL
Freq: Once | CUTANEOUS | Status: AC
  Administered 2016-03-17: 13:00:00 via TOPICAL

## 2016-03-18 ENCOUNTER — Ambulatory Visit
Admission: RE | Admit: 2016-03-18 | Discharge: 2016-03-18 | Disposition: A | Discharge status: Home / Self Care | Payer: BLUE CROSS/BLUE SHIELD | Source: Ambulatory Visit | Attending: Radiation Oncology | Admitting: Radiation Oncology

## 2016-03-18 DIAGNOSIS — C50512 Malignant neoplasm of lower-outer quadrant of left female breast: Secondary | ICD-10-CM | POA: Diagnosis not present

## 2016-03-18 DIAGNOSIS — Z170 Estrogen receptor positive status [ER+]: Secondary | ICD-10-CM | POA: Diagnosis not present

## 2016-03-19 ENCOUNTER — Ambulatory Visit
Admission: RE | Admit: 2016-03-19 | Discharge: 2016-03-19 | Disposition: A | Discharge status: Home / Self Care | Payer: BLUE CROSS/BLUE SHIELD | Source: Ambulatory Visit | Attending: Radiation Oncology | Admitting: Radiation Oncology

## 2016-03-19 ENCOUNTER — Other Ambulatory Visit: Payer: Self-pay | Admitting: *Deleted

## 2016-03-19 DIAGNOSIS — Z170 Estrogen receptor positive status [ER+]: Secondary | ICD-10-CM | POA: Diagnosis not present

## 2016-03-19 DIAGNOSIS — C50512 Malignant neoplasm of lower-outer quadrant of left female breast: Secondary | ICD-10-CM | POA: Diagnosis not present

## 2016-03-19 DIAGNOSIS — E063 Autoimmune thyroiditis: Secondary | ICD-10-CM

## 2016-03-19 MED ORDER — LEVOTHYROXINE SODIUM 50 MCG PO TABS: 50.0000 ug | ORAL_TABLET | Freq: Every day | ORAL | 0 refills | Status: DC

## 2016-03-22 ENCOUNTER — Ambulatory Visit
Admission: RE | Admit: 2016-03-22 | Discharge: 2016-03-22 | Disposition: A | Discharge status: Home / Self Care | Payer: BLUE CROSS/BLUE SHIELD | Source: Ambulatory Visit | Attending: Radiation Oncology | Admitting: Radiation Oncology

## 2016-03-22 DIAGNOSIS — C50512 Malignant neoplasm of lower-outer quadrant of left female breast: Secondary | ICD-10-CM | POA: Diagnosis not present

## 2016-03-22 DIAGNOSIS — Z170 Estrogen receptor positive status [ER+]: Secondary | ICD-10-CM | POA: Diagnosis not present

## 2016-03-23 ENCOUNTER — Ambulatory Visit
Admission: RE | Admit: 2016-03-23 | Discharge: 2016-03-23 | Disposition: A | Discharge status: Home / Self Care | Payer: BLUE CROSS/BLUE SHIELD | Source: Ambulatory Visit | Attending: Radiation Oncology | Admitting: Radiation Oncology

## 2016-03-23 ENCOUNTER — Encounter: Payer: Self-pay | Admitting: Radiation Oncology

## 2016-03-23 VITALS — BP 133/81 | HR 80 | Temp 98.5°F | Ht 62.0 in | Wt 166.0 lb

## 2016-03-23 DIAGNOSIS — Z170 Estrogen receptor positive status [ER+]: Secondary | ICD-10-CM | POA: Diagnosis not present

## 2016-03-23 DIAGNOSIS — C50512 Malignant neoplasm of lower-outer quadrant of left female breast: Secondary | ICD-10-CM

## 2016-03-23 NOTE — Progress Notes (Signed)
Jenna [name] has completed 10 fractions to her left breast.  She reports having burning inside her left breast.  She said this started with radiation.  She reports her energy level is improving.  She is using radiaplex.  The skin on her left breast has slight hyperpigmentation.  BP 133/81 (BP Location: Right Arm, Patient Position: Sitting)   Pulse 80   Temp 98.5 F (36.9 C) (Oral)   Ht 5\' 2"  (1.575 m)   Wt 166 lb (75.3 kg)   SpO2 100%   BMI 30.36 kg/m    Wt Readings from Last 3 Encounters:  03/23/16 166 lb (75.3 kg)  03/16/16 169 lb 12.8 oz (77 kg)  02/13/16 168 lb 14.4 oz (76.6 kg)

## 2016-03-23 NOTE — Progress Notes (Signed)
  Radiation Oncology         [phone]) [phone] ________________________________  Name: Jenna [name] MRN: [id number]  Date: 03/23/2016  DOB: [birth year]  Weekly Radiation Therapy Management    ICD-9-CM ICD-10-CM   1. Malignant neoplasm of lower-outer quadrant of left breast of female, estrogen receptor positive (HCC) 174.5 C50.512    V86.0 Z17.0      Current Dose: 18 Gy     Planned Dose:  50.4 Gy  Narrative . . . . . . . . The patient presents for routine under treatment assessment.     The patient has completed 10 fractions to the left breast. She reports having burning inside her left breast but this does not keep her awake at night. She states this comes and goes and that the area is tender to touch.  She said this started with radiation.  She reports her energy level is improving.  Pt is using radiaplex.                                      The patient is without complaint.                                 Set-up films were reviewed.                                 The chart was checked. Physical Findings. . .  height is 5\' 2"  (1.575 m) and weight is 166 lb (75.3 kg). Her oral temperature is 98.5 F (36.9 C). Her blood pressure is 133/81 and her pulse is 80. Her oxygen saturation is 100%.  Weight essentially stable.  No significant changes. Lungs are clear to auscultation bilaterally. Heart is regular in rate and rhythm. Minimal erythema at left breast.  Impression . . . . . . . The patient is tolerating radiation. Plan . . . . . . . . . . . . Continue treatment as planned.  ________________________________   Blair Promise, PhD, MD  This document serves as a record of services personally performed by Gery Pray, MD. It was created on his behalf by Maryla Morrow, a trained medical scribe. The creation of this record is based on the scribe's personal observations and the provider's statements to them. This document has been checked and approved by the attending provider.

## 2016-03-24 ENCOUNTER — Ambulatory Visit
Admission: RE | Admit: 2016-03-24 | Discharge: 2016-03-24 | Disposition: A | Discharge status: Home / Self Care | Payer: BLUE CROSS/BLUE SHIELD | Source: Ambulatory Visit | Attending: Radiation Oncology | Admitting: Radiation Oncology

## 2016-03-24 DIAGNOSIS — Z170 Estrogen receptor positive status [ER+]: Secondary | ICD-10-CM | POA: Diagnosis not present

## 2016-03-24 DIAGNOSIS — C50512 Malignant neoplasm of lower-outer quadrant of left female breast: Secondary | ICD-10-CM | POA: Diagnosis not present

## 2016-03-25 ENCOUNTER — Ambulatory Visit
Admission: RE | Admit: 2016-03-25 | Discharge: 2016-03-25 | Disposition: A | Discharge status: Home / Self Care | Payer: BLUE CROSS/BLUE SHIELD | Source: Ambulatory Visit | Attending: Radiation Oncology | Admitting: Radiation Oncology

## 2016-03-25 DIAGNOSIS — Z170 Estrogen receptor positive status [ER+]: Secondary | ICD-10-CM | POA: Diagnosis not present

## 2016-03-25 DIAGNOSIS — C50512 Malignant neoplasm of lower-outer quadrant of left female breast: Secondary | ICD-10-CM | POA: Diagnosis not present

## 2016-03-26 ENCOUNTER — Other Ambulatory Visit: Payer: Self-pay | Admitting: *Deleted

## 2016-03-26 ENCOUNTER — Other Ambulatory Visit: Payer: BLUE CROSS/BLUE SHIELD

## 2016-03-26 ENCOUNTER — Ambulatory Visit
Admission: RE | Admit: 2016-03-26 | Discharge: 2016-03-26 | Disposition: A | Discharge status: Home / Self Care | Payer: BLUE CROSS/BLUE SHIELD | Source: Ambulatory Visit | Attending: Radiation Oncology | Admitting: Radiation Oncology

## 2016-03-26 ENCOUNTER — Other Ambulatory Visit: Payer: Self-pay | Admitting: Physician Assistant

## 2016-03-26 ENCOUNTER — Ambulatory Visit (HOSPITAL_BASED_OUTPATIENT_CLINIC_OR_DEPARTMENT_OTHER): Payer: BLUE CROSS/BLUE SHIELD

## 2016-03-26 VITALS — BP 133/72 | HR 97 | Temp 97.8°F | Resp 18

## 2016-03-26 DIAGNOSIS — Z5112 Encounter for antineoplastic immunotherapy: Secondary | ICD-10-CM

## 2016-03-26 DIAGNOSIS — C50512 Malignant neoplasm of lower-outer quadrant of left female breast: Secondary | ICD-10-CM

## 2016-03-26 DIAGNOSIS — Z170 Estrogen receptor positive status [ER+]: Principal | ICD-10-CM

## 2016-03-26 MED ORDER — SODIUM CHLORIDE 0.9% FLUSH
10.0000 mL | INTRAVENOUS | Status: DC | PRN
  Administered 2016-03-26: 10 mL
  Filled 2016-03-26: qty 10

## 2016-03-26 MED ORDER — SODIUM CHLORIDE 0.9 % IV SOLN: Freq: Once | INTRAVENOUS | Status: DC

## 2016-03-26 MED ORDER — SODIUM CHLORIDE 0.9 % IV SOLN
Freq: Once | INTRAVENOUS | Status: AC
  Administered 2016-03-26: 09:00:00 via INTRAVENOUS

## 2016-03-26 MED ORDER — ACETAMINOPHEN 325 MG PO TABS
ORAL_TABLET | ORAL | Status: AC
  Filled 2016-03-26: qty 2

## 2016-03-26 MED ORDER — DIPHENHYDRAMINE HCL 25 MG PO CAPS
50.0000 mg | ORAL_CAPSULE | Freq: Once | ORAL | Status: AC
  Administered 2016-03-26: 50 mg via ORAL

## 2016-03-26 MED ORDER — HEPARIN SOD (PORK) LOCK FLUSH 100 UNIT/ML IV SOLN
500.0000 [IU] | Freq: Once | INTRAVENOUS | Status: AC | PRN
  Administered 2016-03-26: 500 [IU]
  Filled 2016-03-26: qty 5

## 2016-03-26 MED ORDER — SODIUM CHLORIDE 0.9 % IV SOLN
6.0000 mg/kg | Freq: Once | INTRAVENOUS | Status: AC
  Administered 2016-03-26: 441 mg via INTRAVENOUS
  Filled 2016-03-26: qty 21

## 2016-03-26 MED ORDER — ACETAMINOPHEN 325 MG PO TABS
650.0000 mg | ORAL_TABLET | Freq: Once | ORAL | Status: AC
  Administered 2016-03-26: 650 mg via ORAL

## 2016-03-26 MED ORDER — SODIUM CHLORIDE 0.9 % IV SOLN
Freq: Once | INTRAVENOUS | Status: AC
  Administered 2016-03-26: 10:00:00 via INTRAVENOUS

## 2016-03-26 MED ORDER — DIPHENHYDRAMINE HCL 25 MG PO CAPS
ORAL_CAPSULE | ORAL | Status: AC
  Filled 2016-03-26: qty 2

## 2016-03-26 NOTE — Progress Notes (Signed)
Pt reports feeling "very Tired" and nauseous. Pt states that when she has received IV fluids in the past they have helped. Per Val RN per Dr. Jana Hakim okay to give 541ml of NS over one hour and can run at the same time as Herceptin. Pt aware.  Pt reports that nausea has improved after receiving IVFs. Pt and VS stable at discharge.

## 2016-03-26 NOTE — Patient Instructions (Signed)
[facility] Discharge Instructions for Patients Receiving Chemotherapy  Today you received the following chemotherapy agents: Herceptin   To help prevent nausea and vomiting after your treatment, we encourage you to take your nausea medication as directed.    If you develop nausea and vomiting that is not controlled by your nausea medication, call the clinic.   BELOW ARE SYMPTOMS THAT SHOULD BE REPORTED IMMEDIATELY:  *FEVER GREATER THAN 100.5 F  *CHILLS WITH OR WITHOUT FEVER  NAUSEA AND VOMITING THAT IS NOT CONTROLLED WITH YOUR NAUSEA MEDICATION  *UNUSUAL SHORTNESS OF BREATH  *UNUSUAL BRUISING OR BLEEDING  TENDERNESS IN MOUTH AND THROAT WITH OR WITHOUT PRESENCE OF ULCERS  *URINARY PROBLEMS  *BOWEL PROBLEMS  UNUSUAL RASH Items with * indicate a potential emergency and should be followed up as soon as possible.  Feel free to call the clinic you have any questions or concerns. The clinic phone number is [phone].  Please show the CHEMO ALERT CARD at check-in to the Emergency Department and triage nurse.   

## 2016-03-29 ENCOUNTER — Ambulatory Visit
Admission: RE | Admit: 2016-03-29 | Discharge: 2016-03-29 | Disposition: A | Discharge status: Home / Self Care | Payer: BLUE CROSS/BLUE SHIELD | Source: Ambulatory Visit | Attending: Radiation Oncology | Admitting: Radiation Oncology

## 2016-03-29 DIAGNOSIS — C50512 Malignant neoplasm of lower-outer quadrant of left female breast: Secondary | ICD-10-CM | POA: Diagnosis not present

## 2016-03-29 DIAGNOSIS — Z170 Estrogen receptor positive status [ER+]: Secondary | ICD-10-CM | POA: Diagnosis not present

## 2016-03-30 ENCOUNTER — Ambulatory Visit
Admission: RE | Admit: 2016-03-30 | Discharge: 2016-03-30 | Disposition: A | Discharge status: Home / Self Care | Payer: BLUE CROSS/BLUE SHIELD | Source: Ambulatory Visit | Attending: Radiation Oncology | Admitting: Radiation Oncology

## 2016-03-30 VITALS — BP 131/77 | HR 86 | Temp 98.4°F | Resp 18 | Wt 165.8 lb

## 2016-03-30 DIAGNOSIS — Z170 Estrogen receptor positive status [ER+]: Secondary | ICD-10-CM | POA: Insufficient documentation

## 2016-03-30 DIAGNOSIS — C50512 Malignant neoplasm of lower-outer quadrant of left female breast: Secondary | ICD-10-CM | POA: Insufficient documentation

## 2016-03-30 MED ORDER — RADIAPLEXRX EX GEL
Freq: Once | CUTANEOUS | Status: AC
  Administered 2016-03-30: 15:00:00 via TOPICAL

## 2016-03-30 NOTE — Progress Notes (Signed)
  Radiation Oncology         [phone]) [phone] ________________________________  Name: Jenna [name] MRN: [id number]  Date: 03/30/2016  DOB: 1960-05-10  Weekly Radiation Therapy Management    ICD-9-CM ICD-10-CM   1. Malignant neoplasm of lower-outer quadrant of left breast of female, estrogen receptor positive (HCC) 174.5 C50.512 hyaluronate sodium (RADIAPLEXRX) gel   V86.0 Z17.0      Current Dose: 27 Gy     Planned Dose:  50.4 Gy  Narrative . . . . . . . . The patient presents for routine under treatment assessment.     Jenna [name] completed 15th fraction to left breast today.  Patient complains of a tightness of the op of the left. Patient states the area around the nipple is painful to touch.  She occasionally has fatigue and a massage yesterday helped. Patient has a good appetite on some days and poor on others. The nurse notes skin to the left breast is clean, dry, and intact. She has some slight hyperpigmentation and pinkness to left breast. Patient states she is using radiaplex gel only once daily.                                   Set-up films were reviewed.                                 The chart was checked. Physical Findings. . .  weight is 165 lb 12.8 oz (75.2 kg). Her oral temperature is 98.4 F (36.9 C). Her blood pressure is 131/77 and her pulse is 86. Her respiration is 18 and oxygen saturation is 100%.  Weight essentially stable.  No significant changes. Lungs are clear to auscultation bilaterally. Heart is regular in rate and rhythm. Erythema and hyperpigmentation changes of the left breast.  Impression . . . . . . . The patient is tolerating radiation. Plan . . . . . . . . . . . . Continue treatment as planned.  ________________________________   Blair Promise, PhD, MD  This document serves as a record of services personally performed by Gery Pray, MD. It was created on his behalf by Darcus Austin, a trained medical scribe. The creation of this record is based on the  scribe's personal observations and the provider's statements to them. This document has been checked and approved by the attending provider.

## 2016-03-30 NOTE — Addendum Note (Signed)
Encounter addended by: Wilmon Arms, RN on: 03/30/2016  2:47 PM<BR>    Actions taken: [facility] administration accepted

## 2016-03-30 NOTE — Progress Notes (Signed)
Jenna [name] completed 15th fraction to left breast today.  Patient complains of a tightness to left breast on top of breast.  Patient states that area around the nipple is painful to touch.  Patient states that she occasionally has fatigue but yesterday got a massage here at the cancer center and that helped a lot. Patient states her appetite is good some days and others has no appetite.  Skin to left breast is clean, dry and intact.  She has some slight hyperpigmentation and pinkness to left breast.  Patient states she is using radiaplex gel only once daily.       Vitals:   03/30/16 1048  BP: 131/77  Pulse: 86  Resp: 18  Temp: 98.4 F (36.9 C)  TempSrc: Oral  SpO2: 100%  Weight: 165 lb 12.8 oz (75.2 kg)    Wt Readings from Last 3 Encounters:  03/30/16 165 lb 12.8 oz (75.2 kg)  03/23/16 166 lb (75.3 kg)  03/16/16 169 lb 12.8 oz (77 kg)

## 2016-03-31 ENCOUNTER — Ambulatory Visit
Admission: RE | Admit: 2016-03-31 | Discharge: 2016-03-31 | Disposition: A | Discharge status: Home / Self Care | Payer: BLUE CROSS/BLUE SHIELD | Source: Ambulatory Visit | Attending: Radiation Oncology | Admitting: Radiation Oncology

## 2016-03-31 DIAGNOSIS — Z170 Estrogen receptor positive status [ER+]: Secondary | ICD-10-CM | POA: Diagnosis not present

## 2016-03-31 DIAGNOSIS — C50512 Malignant neoplasm of lower-outer quadrant of left female breast: Secondary | ICD-10-CM | POA: Diagnosis not present

## 2016-04-01 ENCOUNTER — Ambulatory Visit
Admission: RE | Admit: 2016-04-01 | Discharge: 2016-04-01 | Disposition: A | Discharge status: Home / Self Care | Payer: BLUE CROSS/BLUE SHIELD | Source: Ambulatory Visit | Attending: Radiation Oncology | Admitting: Radiation Oncology

## 2016-04-01 DIAGNOSIS — C50512 Malignant neoplasm of lower-outer quadrant of left female breast: Secondary | ICD-10-CM | POA: Diagnosis not present

## 2016-04-01 DIAGNOSIS — Z170 Estrogen receptor positive status [ER+]: Secondary | ICD-10-CM | POA: Diagnosis not present

## 2016-04-02 ENCOUNTER — Ambulatory Visit
Admission: RE | Admit: 2016-04-02 | Discharge: 2016-04-02 | Disposition: A | Discharge status: Home / Self Care | Payer: BLUE CROSS/BLUE SHIELD | Source: Ambulatory Visit | Attending: Radiation Oncology | Admitting: Radiation Oncology

## 2016-04-02 DIAGNOSIS — C50512 Malignant neoplasm of lower-outer quadrant of left female breast: Secondary | ICD-10-CM | POA: Diagnosis not present

## 2016-04-02 DIAGNOSIS — Z170 Estrogen receptor positive status [ER+]: Secondary | ICD-10-CM | POA: Diagnosis not present

## 2016-04-05 ENCOUNTER — Ambulatory Visit
Admission: RE | Admit: 2016-04-05 | Discharge: 2016-04-05 | Disposition: A | Discharge status: Home / Self Care | Payer: BLUE CROSS/BLUE SHIELD | Source: Ambulatory Visit | Attending: Radiation Oncology | Admitting: Radiation Oncology

## 2016-04-05 DIAGNOSIS — Z170 Estrogen receptor positive status [ER+]: Secondary | ICD-10-CM | POA: Diagnosis not present

## 2016-04-05 DIAGNOSIS — C50512 Malignant neoplasm of lower-outer quadrant of left female breast: Secondary | ICD-10-CM | POA: Diagnosis not present

## 2016-04-06 ENCOUNTER — Ambulatory Visit
Admission: RE | Admit: 2016-04-06 | Discharge: 2016-04-06 | Disposition: A | Discharge status: Home / Self Care | Payer: BLUE CROSS/BLUE SHIELD | Source: Ambulatory Visit | Attending: Radiation Oncology | Admitting: Radiation Oncology

## 2016-04-06 VITALS — BP 130/81 | HR 73 | Temp 98.4°F | Ht 62.0 in | Wt 166.2 lb

## 2016-04-06 DIAGNOSIS — Z170 Estrogen receptor positive status [ER+]: Principal | ICD-10-CM

## 2016-04-06 DIAGNOSIS — C50512 Malignant neoplasm of lower-outer quadrant of left female breast: Secondary | ICD-10-CM

## 2016-04-06 MED ORDER — BIAFINE EX EMUL
Freq: Once | CUTANEOUS | Status: AC
  Administered 2016-04-06: 11:00:00 via TOPICAL

## 2016-04-06 NOTE — Progress Notes (Signed)
Jenna [name] has completed 20 fractions to her left breast.  She reports having pain in her left upper ribs that started last Tuesday.  She said the pain the pain is dull and constant and then she will get sharp jabs that last for 5-10 minutes.  She reports having fatigue.  She is using radiaplex as directed.  The skin on her left breast is red.  She has been given hydrogel pads to try.  BP 130/81 (BP Location: Left Arm, Patient Position: Sitting)   Pulse 73   Temp 98.4 F (36.9 C) (Oral)   Ht 5\' 2"  (1.575 m)   Wt 166 lb 3.2 oz (75.4 kg)   SpO2 100%   BMI 30.40 kg/m    Wt Readings from Last 3 Encounters:  04/06/16 166 lb 3.2 oz (75.4 kg)  03/30/16 165 lb 12.8 oz (75.2 kg)  03/23/16 166 lb (75.3 kg)

## 2016-04-06 NOTE — Progress Notes (Signed)
  Radiation Oncology         [phone]) [phone] ________________________________  Name: Jenna [name] MRN: [id number]  Date: 04/06/2016  DOB: August 04, 1960  Weekly Radiation Therapy Management    ICD-9-CM ICD-10-CM   1. Malignant neoplasm of lower-outer quadrant of left breast of female, estrogen receptor positive (HCC) 174.5 C50.512 topical emolient (BIAFINE) emulsion   V86.0 Z17.0      Current Dose: 36 Gy     Planned Dose:  50.4 Gy  Narrative . . . . . . . . The patient presents for routine under treatment assessment.     [name] has completed 20 fractions to her left breast.  She reports having pain in her left upper ribs that started last Tuesday.  She said the pain is dull and constant and then she will get sharp jabs that last for 5-10 minutes.  The patient also has itching of the treatment area.  She reports having fatigue.  She is using radiaplex as directed.  The skin on her left breast is red.                                  Set-up films were reviewed.                                 The chart was checked. Physical Findings. . .  height is 5\' 2"  (1.575 m) and weight is 166 lb 3.2 oz (75.4 kg). Her oral temperature is 98.4 F (36.9 C). Her blood pressure is 130/81 and her pulse is 73. Her oxygen saturation is 100%.  Weight essentially stable.  No significant changes. Lungs are clear to auscultation bilaterally. Heart is regular in rate and rhythm. Moderate rythema and mild hyperpigmentation changes of the left breast. Mild swellling along left lateral ribcage where patient is sore. Impression . . . . . . . The patient is tolerating radiation. Plan . . . . . . . . . . . . Continue treatment as planned. The patient was provided with Biafine to help with the itching and she has been provided with hydrogel pads for pain relief. ________________________________   Blair Promise, PhD, MD  This document serves as a record of services personally performed by Gery Pray, MD. It was created on  his behalf by Darcus Austin, a trained medical scribe. The creation of this record is based on the scribe's personal observations and the provider's statements to them. This document has been checked and approved by the attending provider.

## 2016-04-07 ENCOUNTER — Ambulatory Visit
Admission: RE | Admit: 2016-04-07 | Discharge: 2016-04-07 | Disposition: A | Discharge status: Home / Self Care | Payer: BLUE CROSS/BLUE SHIELD | Source: Ambulatory Visit | Attending: Radiation Oncology | Admitting: Radiation Oncology

## 2016-04-07 DIAGNOSIS — C50512 Malignant neoplasm of lower-outer quadrant of left female breast: Secondary | ICD-10-CM | POA: Diagnosis not present

## 2016-04-07 DIAGNOSIS — Z170 Estrogen receptor positive status [ER+]: Secondary | ICD-10-CM | POA: Diagnosis not present

## 2016-04-08 ENCOUNTER — Ambulatory Visit
Admission: RE | Admit: 2016-04-08 | Discharge: 2016-04-08 | Disposition: A | Discharge status: Home / Self Care | Payer: BLUE CROSS/BLUE SHIELD | Source: Ambulatory Visit | Attending: Radiation Oncology | Admitting: Radiation Oncology

## 2016-04-08 DIAGNOSIS — Z170 Estrogen receptor positive status [ER+]: Secondary | ICD-10-CM | POA: Diagnosis not present

## 2016-04-08 DIAGNOSIS — C50512 Malignant neoplasm of lower-outer quadrant of left female breast: Secondary | ICD-10-CM | POA: Diagnosis not present

## 2016-04-09 ENCOUNTER — Ambulatory Visit
Admission: RE | Admit: 2016-04-09 | Discharge: 2016-04-09 | Disposition: A | Discharge status: Home / Self Care | Payer: BLUE CROSS/BLUE SHIELD | Source: Ambulatory Visit | Attending: Radiation Oncology | Admitting: Radiation Oncology

## 2016-04-09 DIAGNOSIS — C50512 Malignant neoplasm of lower-outer quadrant of left female breast: Secondary | ICD-10-CM | POA: Diagnosis not present

## 2016-04-09 DIAGNOSIS — Z170 Estrogen receptor positive status [ER+]: Secondary | ICD-10-CM | POA: Diagnosis not present

## 2016-04-12 ENCOUNTER — Ambulatory Visit
Admission: RE | Admit: 2016-04-12 | Discharge: 2016-04-12 | Disposition: A | Discharge status: Home / Self Care | Payer: BLUE CROSS/BLUE SHIELD | Source: Ambulatory Visit | Attending: Radiation Oncology | Admitting: Radiation Oncology

## 2016-04-12 DIAGNOSIS — Z170 Estrogen receptor positive status [ER+]: Secondary | ICD-10-CM | POA: Diagnosis not present

## 2016-04-12 DIAGNOSIS — C50512 Malignant neoplasm of lower-outer quadrant of left female breast: Secondary | ICD-10-CM | POA: Diagnosis not present

## 2016-04-13 ENCOUNTER — Ambulatory Visit
Admission: RE | Admit: 2016-04-13 | Payer: BLUE CROSS/BLUE SHIELD | Source: Ambulatory Visit | Admitting: Radiation Oncology

## 2016-04-13 ENCOUNTER — Ambulatory Visit
Admission: RE | Admit: 2016-04-13 | Discharge: 2016-04-13 | Disposition: A | Discharge status: Home / Self Care | Payer: BLUE CROSS/BLUE SHIELD | Source: Ambulatory Visit | Attending: Radiation Oncology | Admitting: Radiation Oncology

## 2016-04-13 ENCOUNTER — Ambulatory Visit: Payer: BLUE CROSS/BLUE SHIELD | Admitting: Radiation Oncology

## 2016-04-13 DIAGNOSIS — C50512 Malignant neoplasm of lower-outer quadrant of left female breast: Secondary | ICD-10-CM | POA: Diagnosis not present

## 2016-04-13 DIAGNOSIS — Z170 Estrogen receptor positive status [ER+]: Principal | ICD-10-CM

## 2016-04-13 MED ORDER — RADIAPLEXRX EX GEL: Freq: Once | CUTANEOUS | Status: DC

## 2016-04-13 MED ORDER — BIAFINE EX EMUL: Freq: Once | CUTANEOUS | Status: DC

## 2016-04-14 ENCOUNTER — Ambulatory Visit
Admission: RE | Admit: 2016-04-14 | Discharge: 2016-04-14 | Disposition: A | Discharge status: Home / Self Care | Payer: BLUE CROSS/BLUE SHIELD | Source: Ambulatory Visit | Attending: Radiation Oncology | Admitting: Radiation Oncology

## 2016-04-14 DIAGNOSIS — Z170 Estrogen receptor positive status [ER+]: Secondary | ICD-10-CM | POA: Diagnosis not present

## 2016-04-14 DIAGNOSIS — C50512 Malignant neoplasm of lower-outer quadrant of left female breast: Secondary | ICD-10-CM | POA: Diagnosis not present

## 2016-04-15 ENCOUNTER — Ambulatory Visit
Admission: RE | Admit: 2016-04-15 | Discharge: 2016-04-15 | Disposition: A | Discharge status: Home / Self Care | Payer: BLUE CROSS/BLUE SHIELD | Source: Ambulatory Visit | Attending: Radiation Oncology | Admitting: Radiation Oncology

## 2016-04-15 DIAGNOSIS — Z170 Estrogen receptor positive status [ER+]: Secondary | ICD-10-CM | POA: Diagnosis not present

## 2016-04-15 DIAGNOSIS — C50512 Malignant neoplasm of lower-outer quadrant of left female breast: Secondary | ICD-10-CM | POA: Diagnosis not present

## 2016-04-16 ENCOUNTER — Other Ambulatory Visit: Payer: Self-pay

## 2016-04-16 ENCOUNTER — Encounter: Payer: Self-pay | Admitting: Hematology and Oncology

## 2016-04-16 ENCOUNTER — Other Ambulatory Visit (HOSPITAL_BASED_OUTPATIENT_CLINIC_OR_DEPARTMENT_OTHER): Payer: BLUE CROSS/BLUE SHIELD

## 2016-04-16 ENCOUNTER — Encounter: Payer: Self-pay | Admitting: Pharmacist

## 2016-04-16 ENCOUNTER — Ambulatory Visit: Payer: BLUE CROSS/BLUE SHIELD

## 2016-04-16 ENCOUNTER — Ambulatory Visit (HOSPITAL_BASED_OUTPATIENT_CLINIC_OR_DEPARTMENT_OTHER): Payer: BLUE CROSS/BLUE SHIELD | Admitting: Hematology and Oncology

## 2016-04-16 ENCOUNTER — Ambulatory Visit
Admission: RE | Admit: 2016-04-16 | Discharge: 2016-04-16 | Disposition: A | Discharge status: Home / Self Care | Payer: BLUE CROSS/BLUE SHIELD | Source: Ambulatory Visit | Attending: Radiation Oncology | Admitting: Radiation Oncology

## 2016-04-16 ENCOUNTER — Ambulatory Visit (HOSPITAL_BASED_OUTPATIENT_CLINIC_OR_DEPARTMENT_OTHER): Payer: BLUE CROSS/BLUE SHIELD

## 2016-04-16 DIAGNOSIS — C50512 Malignant neoplasm of lower-outer quadrant of left female breast: Secondary | ICD-10-CM | POA: Diagnosis not present

## 2016-04-16 DIAGNOSIS — Z170 Estrogen receptor positive status [ER+]: Principal | ICD-10-CM

## 2016-04-16 DIAGNOSIS — E875 Hyperkalemia: Secondary | ICD-10-CM

## 2016-04-16 DIAGNOSIS — Z5112 Encounter for antineoplastic immunotherapy: Secondary | ICD-10-CM

## 2016-04-16 LAB — COMPREHENSIVE METABOLIC PANEL
ALBUMIN: 3.6 g/dL (ref 3.5–5.0)
ALK PHOS: 70 U/L (ref 40–150)
ALT: 13 U/L (ref 0–55)
AST: 15 U/L (ref 5–34)
Anion Gap: 11 mEq/L (ref 3–11)
BUN: 20.9 mg/dL (ref 7.0–26.0)
CHLORIDE: 106 meq/L (ref 98–109)
CO2: 26 mEq/L (ref 22–29)
Calcium: 10 mg/dL (ref 8.4–10.4)
Creatinine: 0.8 mg/dL (ref 0.6–1.1)
EGFR: 89 mL/min/{1.73_m2} — AB (ref >90)
GLUCOSE: 100 mg/dL (ref 70–140)
SODIUM: 144 meq/L (ref 136–145)
Total Bilirubin: 0.43 mg/dL (ref 0.20–1.20)
Total Protein: 7.2 g/dL (ref 6.4–8.3)

## 2016-04-16 LAB — CBC WITH DIFFERENTIAL/PLATELET
BASO%: 0.3 % (ref 0.0–2.0)
BASOS ABS: 0 10*3/uL (ref 0.0–0.1)
EOS%: 1.8 % (ref 0.0–7.0)
Eosinophils Absolute: 0.1 10*3/uL (ref 0.0–0.5)
HCT: 38.3 % (ref 34.8–46.6)
HEMOGLOBIN: 12.6 g/dL (ref 11.6–15.9)
LYMPH%: 20.7 % (ref 14.0–49.7)
MCH: 32.8 pg (ref 25.1–34.0)
MCHC: 32.9 g/dL (ref 31.5–36.0)
MCV: 99.7 fL (ref 79.5–101.0)
MONO#: 0.4 10*3/uL (ref 0.1–0.9)
MONO%: 10.7 % (ref 0.0–14.0)
NEUT#: 2.2 10*3/uL (ref 1.5–6.5)
NEUT%: 66.5 % (ref 38.4–76.8)
Platelets: 216 10*3/uL (ref 145–400)
RBC: 3.84 10*6/uL (ref 3.70–5.45)
RDW: 11.8 % (ref 11.2–14.5)
WBC: 3.3 10*3/uL — ABNORMAL LOW (ref 3.9–10.3)
lymph#: 0.7 10*3/uL — ABNORMAL LOW (ref 0.9–3.3)
nRBC: 0 % (ref 0–0)

## 2016-04-16 LAB — POTASSIUM (CC13): Potassium: 3.9 mEq/L (ref 3.5–5.1)

## 2016-04-16 MED ORDER — ACETAMINOPHEN 325 MG PO TABS
ORAL_TABLET | ORAL | Status: AC
  Filled 2016-04-16: qty 2

## 2016-04-16 MED ORDER — HEPARIN SOD (PORK) LOCK FLUSH 100 UNIT/ML IV SOLN
500.0000 [IU] | Freq: Once | INTRAVENOUS | Status: AC | PRN
  Administered 2016-04-16: 500 [IU]
  Filled 2016-04-16: qty 5

## 2016-04-16 MED ORDER — SODIUM CHLORIDE 0.9% FLUSH
10.0000 mL | INTRAVENOUS | Status: DC | PRN
  Administered 2016-04-16: 10 mL
  Filled 2016-04-16: qty 10

## 2016-04-16 MED ORDER — DIPHENHYDRAMINE HCL 25 MG PO CAPS
ORAL_CAPSULE | ORAL | Status: AC
Start: 2016-04-16 — End: 2016-04-16
  Filled 2016-04-16: qty 2

## 2016-04-16 MED ORDER — DIPHENHYDRAMINE HCL 25 MG PO CAPS
50.0000 mg | ORAL_CAPSULE | Freq: Once | ORAL | Status: AC
  Administered 2016-04-16: 50 mg via ORAL

## 2016-04-16 MED ORDER — VENLAFAXINE HCL ER 37.5 MG PO CP24: 37.5000 mg | ORAL_CAPSULE | Freq: Every day | ORAL | 3 refills | Status: DC

## 2016-04-16 MED ORDER — SODIUM CHLORIDE 0.9 % IV SOLN
1000.0000 mL | Freq: Once | INTRAVENOUS | Status: AC
  Administered 2016-04-16: 1000 mL via INTRAVENOUS

## 2016-04-16 MED ORDER — TRASTUZUMAB CHEMO 150 MG IV SOLR
6.0000 mg/kg | Freq: Once | INTRAVENOUS | Status: AC
  Administered 2016-04-16: 441 mg via INTRAVENOUS
  Filled 2016-04-16: qty 21

## 2016-04-16 MED ORDER — ACETAMINOPHEN 325 MG PO TABS
650.0000 mg | ORAL_TABLET | Freq: Once | ORAL | Status: AC
  Administered 2016-04-16: 650 mg via ORAL

## 2016-04-16 NOTE — Progress Notes (Addendum)
Consent documentation  Study code: [id number]  Met with patient following the patients provider visit on 04/16/16 at 0915. Provided an overview of the "Pharmacogenetic analysis of toxicities related to administration of taxanes in breast cancer patients" study.  Consent form was reviewed with the patient (reviewed the study purpose, patient's role, possible side effects, cost, risk, information protection) All of the patient's questions were answered and she agreed to participate in the study. A signed copy of the consent form was given to the patient. All eligibility criteria have been met and patient has been enrolled in the study.  Study sample was collected via buccal swab after consent was obtained. Patient was informed that the sample would be sent to the lab for processing after samples were collected from 25 patients and that it would take approximately 2 weeks for the lab to process that sample.   Met with the patient for 15 minutes.  Darl Pikes, PharmD, [facility] Clinical Pharmacist- Oncology Pharmacy Resident

## 2016-04-16 NOTE — Patient Instructions (Signed)
[facility] Discharge Instructions for Patients Receiving Chemotherapy  Today you received the following chemotherapy agents: Herceptin   To help prevent nausea and vomiting after your treatment, we encourage you to take your nausea medication as directed.    If you develop nausea and vomiting that is not controlled by your nausea medication, call the clinic.   BELOW ARE SYMPTOMS THAT SHOULD BE REPORTED IMMEDIATELY:  *FEVER GREATER THAN 100.5 F  *CHILLS WITH OR WITHOUT FEVER  NAUSEA AND VOMITING THAT IS NOT CONTROLLED WITH YOUR NAUSEA MEDICATION  *UNUSUAL SHORTNESS OF BREATH  *UNUSUAL BRUISING OR BLEEDING  TENDERNESS IN MOUTH AND THROAT WITH OR WITHOUT PRESENCE OF ULCERS  *URINARY PROBLEMS  *BOWEL PROBLEMS  UNUSUAL RASH Items with * indicate a potential emergency and should be followed up as soon as possible.  Feel free to call the clinic you have any questions or concerns. The clinic phone number is [phone].  Please show the CHEMO ALERT CARD at check-in to the Emergency Department and triage nurse.   

## 2016-04-16 NOTE — Assessment & Plan Note (Signed)
Left lumpectomy 09/25/2015: IDC with DCIS, 1.2 cm, margins negative, 0/4 lymph nodes negative, grade 3, ER 70%, PR 5%, HER-2 positive ratio 3.45, Ki-67 10%, T1 CN 0 stage IA  Treatment plan: 1. Adjuvant chemotherapy with TCH 6 cycles 10/31/2015- 2/9/2018followed by Herceptin every 3 weeks for 1 year 2. Followed by adjuvant radiation 03/10/2016- 05/03/2016  3. Followed by adjuvant antiestrogen therapy ---------------------------------------------------------------------------------------------------------------------------------- Current treatment: Herceptin maintenance Herceptin toxicities: None Prior chemotherapy toxicities: Cytopenias, monitoring closely I discussed the role of adjuvant antiestrogen therapy in the treatment plan.  Letrozole counseling:We discussed the risks and benefits of anti-estrogen therapy with aromatase inhibitors. These include but not limited to insomnia, hot flashes, mood changes, vaginal dryness, bone density loss, and weight gain. We strongly believe that the benefits far outweigh the risks. Patient understands these risks and consented to starting treatment. Planned treatment duration is 5 years.  Return to clinic every 3 weeks for Herceptin every 6 weeks to see me as well

## 2016-04-16 NOTE — Progress Notes (Signed)
Patient Care Team: Donella Stade, PA-C as PCP - General (Family Medicine) Erroll Luna, MD as Consulting Physician (General Surgery) Nicholas Lose, MD as Consulting Physician (Hematology and Oncology) Gery Pray, MD as Consulting Physician (Radiation Oncology)  DIAGNOSIS:  Encounter Diagnosis  Name Primary?  . Malignant neoplasm of lower-outer quadrant of left breast of female, estrogen receptor positive ([place])     SUMMARY OF ONCOLOGIC HISTORY:   Breast cancer of lower-outer quadrant of left female breast ([place])   09/10/2015 Initial Diagnosis    Screening detected left breast asymmetry: 2 adjacent masses 4:00: 8 mm and 3:30: 9 mm; axilla negative, biopsy grade 2-3 IDC, ER 70%, PR 5%, Ki-67 10%, HER-2 positive ratio 3.41, T1BN0 stage IA      09/25/2015 Surgery    Left lumpectomy: IDC with DCIS, 1.2 cm, margins negative, 0/4 lymph nodes negative, grade 3, ER 70%, PR 5%, HER-2 positive ratio 3.45, Ki-67 10%, T1 CN 0 stage IA      10/31/2015 - 02/13/2016 Chemotherapy    Adjuvant chemotherapy with TCH 6 cycles followed by Herceptin maintenance for 1 year      03/10/2016 - 04/16/2016 Radiation Therapy    Adjuvant radiation therapy       CHIEF COMPLIANT: Follow-up of Herceptin treatment  INTERVAL HISTORY: Jenna [name] is a 89 year with above-mentioned history of found left breast cancer currently on adjuvant Herceptin. She is currently going through radiation and has radiation dermatitis. She has no side effects to Herceptin treatment. She feels remarkably better when she receives fluids with Herceptin. She has tremors today. She denies neuropathy.  REVIEW OF SYSTEMS:   Constitutional: Denies fevers, chills or abnormal weight loss Eyes: Denies blurriness of vision Ears, nose, mouth, throat, and face: Denies mucositis or sore throat Respiratory: Denies cough, dyspnea or wheezes Cardiovascular: Denies palpitation, chest discomfort Gastrointestinal:  Denies nausea, heartburn or  change in bowel habits Skin: Denies abnormal skin rashes Lymphatics: Denies new lymphadenopathy or easy bruising Neurological:Denies numbness, tingling or new weaknesses Behavioral/Psych: Mood is stable, no new changes  Extremities: No lower extremity edema Breast: Radiation dermatitis All other systems were reviewed with the patient and are negative.  I have reviewed the past medical history, past surgical history, social history and family history with the patient and they are unchanged from previous note.  ALLERGIES:  is allergic to latex and sulfa antibiotics.  MEDICATIONS:  Current Outpatient Prescriptions  Medication Sig Dispense Refill  . Albuterol Sulfate (PROAIR RESPICLICK) 474 (90 BASE) MCG/ACT AEPB Inhale 2 puffs into the lungs every 6 (six) hours. (Patient not taking: Reported on 10/06/2015) 3 each 3  . dexamethasone (DECADRON) 4 MG tablet Take 1 tablet (4 mg total) by mouth 2 (two) times daily. Start the day before Taxotere. Then again the day after chemo for 1 day (Patient not taking: Reported on 03/16/2016) 30 tablet 1  . dicyclomine (BENTYL) 20 MG tablet Take 1 tablet (20 mg total) by mouth every 6 (six) hours. As needed for cramps in abdomen (Patient not taking: Reported on 12/10/2015) 30 tablet 0  . DULoxetine (CYMBALTA) 60 MG capsule Take 1 capsule (60 mg total) by mouth daily. 90 capsule 3  . emollient (BIAFINE) cream Apply topically 2 (two) times daily.    . hyaluronate sodium (RADIAPLEXRX) GEL Apply 1 application topically 2 (two) times daily.    Marland Kitchen levothyroxine (SYNTHROID, LEVOTHROID) 50 MCG tablet Take 1 tablet (50 mcg total) by mouth daily. 90 tablet 0  . lidocaine-prilocaine (EMLA) cream Apply to affected  area once 30 g 3  . liothyronine (CYTOMEL) 25 MCG tablet TAKE 1 TABLET BY MOUTH EVERY DAY 30 tablet 5  . LORazepam (ATIVAN) 0.5 MG tablet Take 1 tablet (0.5 mg total) by mouth at bedtime. (Patient not taking: Reported on 03/16/2016) 30 tablet 0  . omeprazole  (PRILOSEC) 20 MG capsule Take 1 capsule (20 mg total) by mouth 2 (two) times daily before a meal. (Patient not taking: Reported on 03/16/2016) 180 capsule 4  . ondansetron (ZOFRAN) 8 MG tablet TAKE 1 TABLET BY MOUTH TWICE A DAY AS NEEDED FOR NAUSEA/VOMITING. START 3 DAYS AFTER CHEMO (Patient not taking: Reported on 03/16/2016) 30 tablet 1  . oxyCODONE-acetaminophen (ROXICET) 5-325 MG tablet Take 1-2 tablets by mouth every 4 (four) hours as needed. (Patient not taking: Reported on 12/10/2015) 30 tablet 0  . prochlorperazine (COMPAZINE) 10 MG tablet Take 1 tablet (10 mg total) by mouth every 6 (six) hours as needed (Nausea or vomiting). (Patient not taking: Reported on 03/16/2016) 30 tablet 1   No current facility-administered medications for this visit.     PHYSICAL EXAMINATION: ECOG PERFORMANCE STATUS: 1 - Symptomatic but completely ambulatory  Vitals:   04/16/16 0856  BP: 139/77  Pulse: 83  Resp: 18  Temp: 98.1 F (36.7 C)   Filed Weights   04/16/16 0856  Weight: 165 lb 3.2 oz (74.9 kg)    GENERAL:alert, no distress and comfortable SKIN: skin color, texture, turgor are normal, no rashes or significant lesions EYES: normal, Conjunctiva are pink and non-injected, sclera clear OROPHARYNX:no exudate, no erythema and lips, buccal mucosa, and tongue normal  NECK: supple, thyroid normal size, non-tender, without nodularity LYMPH:  no palpable lymphadenopathy in the cervical, axillary or inguinal LUNGS: clear to auscultation and percussion with normal breathing effort HEART: regular rate & rhythm and no murmurs and no lower extremity edema ABDOMEN:abdomen soft, non-tender and normal bowel sounds MUSCULOSKELETAL:no cyanosis of digits and no clubbing  NEURO: alert & oriented x 3 with fluent speech, no focal motor/sensory deficits EXTREMITIES: No lower extremity edema LABORATORY DATA:  I have reviewed the data as listed   Chemistry      Component Value Date/Time   NA 144 04/16/2016 0842    K 5.7 No visable hemolysis (H) 04/16/2016 0842   CL 102 08/12/2015 0959   CO2 26 04/16/2016 0842   BUN 20.9 04/16/2016 0842   CREATININE 0.8 04/16/2016 0842      Component Value Date/Time   CALCIUM 10.0 04/16/2016 0842   ALKPHOS 70 04/16/2016 0842   AST 15 04/16/2016 0842   ALT 13 04/16/2016 0842   BILITOT 0.43 04/16/2016 0842       Lab Results  Component Value Date   WBC 3.3 (L) 04/16/2016   HGB 12.6 04/16/2016   HCT 38.3 04/16/2016   MCV 99.7 04/16/2016   PLT 216 04/16/2016   NEUTROABS 2.2 04/16/2016    ASSESSMENT & PLAN:  Breast cancer of lower-outer quadrant of left female breast ([place]) Left lumpectomy 09/25/2015: IDC with DCIS, 1.2 cm, margins negative, 0/4 lymph nodes negative, grade 3, ER 70%, PR 5%, HER-2 positive ratio 3.45, Ki-67 10%, T1 CN 0 stage IA  Treatment plan: 1. Adjuvant chemotherapy with TCH 6 cycles 10/31/2015- 2/9/2018followed by Herceptin every 3 weeks for 1 year 2. Followed by adjuvant radiation 03/10/2016-  04/23/2016  3. Followed by adjuvant antiestrogen therapy ---------------------------------------------------------------------------------------------------------------------------------- Current treatment: Herceptin maintenance Herceptin toxicities: None Prior chemotherapy toxicities: Cytopenias, monitoring closely I discussed the role of adjuvant antiestrogen therapy in the  treatment plan.  Severe hot flashes: I sent a prescription for Effexor XR today. Patient was interested in the pharmacogenomic study on neuropathy. Consent will be obtained and cheeks swabs will be obtained for cytochrome P4 50 testing Antiestrogen therapy will start one month after radiation is complete.  Hyperkalemia: I suspect it is hemolysis related. We will repeat it again.  Return to clinic every 3 weeks for Herceptin every 6 weeks to see me as well    I spent 25 minutes talking to the patient of which more than half was spent in counseling and coordination of  care.  No orders of the defined types were placed in this encounter.  The patient has a good understanding of the overall plan. she agrees with it. she will call with any problems that may develop before the next visit here.   Rulon Eisenmenger, MD 04/16/16

## 2016-04-19 ENCOUNTER — Ambulatory Visit: Payer: BLUE CROSS/BLUE SHIELD

## 2016-04-19 ENCOUNTER — Ambulatory Visit
Admission: RE | Admit: 2016-04-19 | Discharge: 2016-04-19 | Disposition: A | Discharge status: Home / Self Care | Payer: BLUE CROSS/BLUE SHIELD | Source: Ambulatory Visit | Attending: Radiation Oncology | Admitting: Radiation Oncology

## 2016-04-19 DIAGNOSIS — Z170 Estrogen receptor positive status [ER+]: Secondary | ICD-10-CM | POA: Diagnosis not present

## 2016-04-19 DIAGNOSIS — C50512 Malignant neoplasm of lower-outer quadrant of left female breast: Secondary | ICD-10-CM | POA: Diagnosis not present

## 2016-04-20 ENCOUNTER — Ambulatory Visit: Payer: BLUE CROSS/BLUE SHIELD

## 2016-04-20 ENCOUNTER — Ambulatory Visit
Admission: RE | Admit: 2016-04-20 | Discharge: 2016-04-20 | Disposition: A | Discharge status: Home / Self Care | Payer: BLUE CROSS/BLUE SHIELD | Source: Ambulatory Visit | Attending: Radiation Oncology | Admitting: Radiation Oncology

## 2016-04-20 DIAGNOSIS — C50512 Malignant neoplasm of lower-outer quadrant of left female breast: Secondary | ICD-10-CM

## 2016-04-20 DIAGNOSIS — Z170 Estrogen receptor positive status [ER+]: Secondary | ICD-10-CM | POA: Diagnosis not present

## 2016-04-20 MED ORDER — RADIAPLEXRX EX GEL
Freq: Once | CUTANEOUS | Status: AC
  Administered 2016-04-20: 12:00:00 via TOPICAL

## 2016-04-21 ENCOUNTER — Ambulatory Visit
Admission: RE | Admit: 2016-04-21 | Discharge: 2016-04-21 | Disposition: A | Discharge status: Home / Self Care | Payer: BLUE CROSS/BLUE SHIELD | Source: Ambulatory Visit | Attending: Radiation Oncology | Admitting: Radiation Oncology

## 2016-04-21 ENCOUNTER — Ambulatory Visit: Payer: BLUE CROSS/BLUE SHIELD

## 2016-04-21 DIAGNOSIS — C50512 Malignant neoplasm of lower-outer quadrant of left female breast: Secondary | ICD-10-CM | POA: Diagnosis not present

## 2016-04-21 DIAGNOSIS — Z170 Estrogen receptor positive status [ER+]: Secondary | ICD-10-CM | POA: Diagnosis not present

## 2016-04-22 ENCOUNTER — Ambulatory Visit: Payer: BLUE CROSS/BLUE SHIELD

## 2016-04-22 ENCOUNTER — Ambulatory Visit
Admission: RE | Admit: 2016-04-22 | Discharge: 2016-04-22 | Disposition: A | Discharge status: Home / Self Care | Payer: BLUE CROSS/BLUE SHIELD | Source: Ambulatory Visit | Attending: Radiation Oncology | Admitting: Radiation Oncology

## 2016-04-22 ENCOUNTER — Inpatient Hospital Stay: Admission: RE | Admit: 2016-04-22 | Payer: Self-pay | Source: Ambulatory Visit | Admitting: Radiation Oncology

## 2016-04-22 DIAGNOSIS — Z170 Estrogen receptor positive status [ER+]: Principal | ICD-10-CM

## 2016-04-22 DIAGNOSIS — C50512 Malignant neoplasm of lower-outer quadrant of left female breast: Secondary | ICD-10-CM

## 2016-04-22 MED ORDER — RADIAPLEXRX EX GEL
Freq: Once | CUTANEOUS | Status: AC
  Administered 2016-04-22: 11:00:00 via TOPICAL

## 2016-04-22 MED ORDER — BIAFINE EX EMUL
Freq: Once | CUTANEOUS | Status: AC
  Administered 2016-04-22: 11:00:00 via TOPICAL

## 2016-04-23 ENCOUNTER — Ambulatory Visit: Payer: BLUE CROSS/BLUE SHIELD

## 2016-04-23 ENCOUNTER — Ambulatory Visit
Admission: RE | Admit: 2016-04-23 | Discharge: 2016-04-23 | Disposition: A | Discharge status: Home / Self Care | Payer: BLUE CROSS/BLUE SHIELD | Source: Ambulatory Visit | Attending: Radiation Oncology | Admitting: Radiation Oncology

## 2016-04-23 DIAGNOSIS — Z170 Estrogen receptor positive status [ER+]: Secondary | ICD-10-CM | POA: Diagnosis not present

## 2016-04-23 DIAGNOSIS — C50512 Malignant neoplasm of lower-outer quadrant of left female breast: Secondary | ICD-10-CM | POA: Diagnosis not present

## 2016-04-26 ENCOUNTER — Ambulatory Visit: Payer: BLUE CROSS/BLUE SHIELD

## 2016-04-29 ENCOUNTER — Encounter: Payer: Self-pay | Admitting: Radiation Oncology

## 2016-04-29 NOTE — Progress Notes (Signed)
  Radiation Oncology         [phone]) [phone] ________________________________  Name: Jenna [name] MRN: [id number]  Date: 04/29/2016  DOB: Oct 09, 1960  End of Treatment Note  Diagnosis: Clinical, T1b Nx Mx, grade 3 invasive ductal carcinoma of the left breast (triple positive)    Indication for treatment:  Curative       Radiation treatment dates:  03/10/16-04/23/16  Site/dose: 1) Left Breast/ 50.4 Gy in 28 fractions   2) Left breast boost/ 10 Gy in 5 fractions  Beams/energy:  1) 3D / 10X, 6X    2) Isodose plan/ 6X  Narrative: The patient tolerated radiation treatment relatively well.  During treatment, the patient complained of a constant dull pain to the left upper ribs. Skin in the treatment area was erythematous with mild hyperpigmentation  Plan: The patient has completed radiation treatment. The patient will return to radiation oncology clinic for routine followup in one month. I advised them to call or return sooner if they have any questions or concerns related to their recovery or treatment.  -----------------------------------  Blair Promise, PhD, MD  This document serves as a record of services personally performed by Gery Pray, MD. It was created on his behalf by Bethann Humble, a trained medical scribe. The creation of this record is based on the scribe's personal observations and the provider's statements to them. This document has been checked and approved by the attending provider.

## 2016-05-07 ENCOUNTER — Other Ambulatory Visit: Payer: BLUE CROSS/BLUE SHIELD

## 2016-05-07 ENCOUNTER — Ambulatory Visit (HOSPITAL_BASED_OUTPATIENT_CLINIC_OR_DEPARTMENT_OTHER): Payer: BLUE CROSS/BLUE SHIELD

## 2016-05-07 VITALS — BP 119/85 | HR 74 | Temp 98.0°F | Resp 18

## 2016-05-07 DIAGNOSIS — C50512 Malignant neoplasm of lower-outer quadrant of left female breast: Secondary | ICD-10-CM | POA: Diagnosis not present

## 2016-05-07 DIAGNOSIS — Z5112 Encounter for antineoplastic immunotherapy: Secondary | ICD-10-CM | POA: Diagnosis not present

## 2016-05-07 DIAGNOSIS — Z170 Estrogen receptor positive status [ER+]: Principal | ICD-10-CM

## 2016-05-07 MED ORDER — ACETAMINOPHEN 325 MG PO TABS
650.0000 mg | ORAL_TABLET | Freq: Once | ORAL | Status: AC
  Administered 2016-05-07: 650 mg via ORAL

## 2016-05-07 MED ORDER — SODIUM CHLORIDE 0.9% FLUSH
10.0000 mL | INTRAVENOUS | Status: DC | PRN
  Administered 2016-05-07: 10 mL
  Filled 2016-05-07: qty 10

## 2016-05-07 MED ORDER — SODIUM CHLORIDE 0.9 % IV SOLN
Freq: Once | INTRAVENOUS | Status: AC
  Administered 2016-05-07: 10:00:00 via INTRAVENOUS

## 2016-05-07 MED ORDER — TRASTUZUMAB CHEMO 150 MG IV SOLR
6.0000 mg/kg | Freq: Once | INTRAVENOUS | Status: AC
  Administered 2016-05-07: 441 mg via INTRAVENOUS
  Filled 2016-05-07: qty 21

## 2016-05-07 MED ORDER — DIPHENHYDRAMINE HCL 25 MG PO CAPS
ORAL_CAPSULE | ORAL | Status: AC
  Filled 2016-05-07: qty 2

## 2016-05-07 MED ORDER — ACETAMINOPHEN 325 MG PO TABS
ORAL_TABLET | ORAL | Status: AC
  Filled 2016-05-07: qty 2

## 2016-05-07 MED ORDER — HEPARIN SOD (PORK) LOCK FLUSH 100 UNIT/ML IV SOLN
500.0000 [IU] | Freq: Once | INTRAVENOUS | Status: AC | PRN
  Administered 2016-05-07: 500 [IU]
  Filled 2016-05-07: qty 5

## 2016-05-07 MED ORDER — DIPHENHYDRAMINE HCL 25 MG PO CAPS
50.0000 mg | ORAL_CAPSULE | Freq: Once | ORAL | Status: AC
  Administered 2016-05-07: 50 mg via ORAL

## 2016-05-07 NOTE — Patient Instructions (Signed)
[facility] Discharge Instructions for Patients Receiving Chemotherapy  Today you received the following chemotherapy agents: Herceptin   To help prevent nausea and vomiting after your treatment, we encourage you to take your nausea medication as directed.    If you develop nausea and vomiting that is not controlled by your nausea medication, call the clinic.   BELOW ARE SYMPTOMS THAT SHOULD BE REPORTED IMMEDIATELY:  *FEVER GREATER THAN 100.5 F  *CHILLS WITH OR WITHOUT FEVER  NAUSEA AND VOMITING THAT IS NOT CONTROLLED WITH YOUR NAUSEA MEDICATION  *UNUSUAL SHORTNESS OF BREATH  *UNUSUAL BRUISING OR BLEEDING  TENDERNESS IN MOUTH AND THROAT WITH OR WITHOUT PRESENCE OF ULCERS  *URINARY PROBLEMS  *BOWEL PROBLEMS  UNUSUAL RASH Items with * indicate a potential emergency and should be followed up as soon as possible.  Feel free to call the clinic you have any questions or concerns. The clinic phone number is [phone].  Please show the CHEMO ALERT CARD at check-in to the Emergency Department and triage nurse.   

## 2016-05-11 ENCOUNTER — Ambulatory Visit (HOSPITAL_COMMUNITY)
Admission: RE | Admit: 2016-05-11 | Discharge: 2016-05-11 | Disposition: A | Discharge status: Home / Self Care | Payer: BLUE CROSS/BLUE SHIELD | Source: Ambulatory Visit | Attending: Internal Medicine | Admitting: Internal Medicine

## 2016-05-11 ENCOUNTER — Encounter (HOSPITAL_COMMUNITY): Payer: BLUE CROSS/BLUE SHIELD | Admitting: Internal Medicine

## 2016-05-11 DIAGNOSIS — Z09 Encounter for follow-up examination after completed treatment for conditions other than malignant neoplasm: Secondary | ICD-10-CM | POA: Diagnosis not present

## 2016-05-11 DIAGNOSIS — C50512 Malignant neoplasm of lower-outer quadrant of left female breast: Secondary | ICD-10-CM

## 2016-05-11 DIAGNOSIS — E785 Hyperlipidemia, unspecified: Secondary | ICD-10-CM | POA: Diagnosis not present

## 2016-05-11 NOTE — Progress Notes (Signed)
  Echocardiogram 2D Echocardiogram has been performed.  Zakariye Nee T Reia Viernes 05/11/2016, 10:13 AM

## 2016-05-27 ENCOUNTER — Other Ambulatory Visit: Payer: Self-pay

## 2016-05-27 DIAGNOSIS — C50512 Malignant neoplasm of lower-outer quadrant of left female breast: Secondary | ICD-10-CM

## 2016-05-28 ENCOUNTER — Other Ambulatory Visit (HOSPITAL_BASED_OUTPATIENT_CLINIC_OR_DEPARTMENT_OTHER): Payer: BLUE CROSS/BLUE SHIELD

## 2016-05-28 ENCOUNTER — Encounter: Payer: Self-pay | Admitting: Hematology and Oncology

## 2016-05-28 ENCOUNTER — Ambulatory Visit (HOSPITAL_BASED_OUTPATIENT_CLINIC_OR_DEPARTMENT_OTHER): Payer: BLUE CROSS/BLUE SHIELD

## 2016-05-28 ENCOUNTER — Telehealth: Payer: Self-pay | Admitting: Hematology and Oncology

## 2016-05-28 ENCOUNTER — Ambulatory Visit (HOSPITAL_BASED_OUTPATIENT_CLINIC_OR_DEPARTMENT_OTHER): Payer: BLUE CROSS/BLUE SHIELD | Admitting: Hematology and Oncology

## 2016-05-28 DIAGNOSIS — Z5112 Encounter for antineoplastic immunotherapy: Secondary | ICD-10-CM

## 2016-05-28 DIAGNOSIS — C50512 Malignant neoplasm of lower-outer quadrant of left female breast: Secondary | ICD-10-CM

## 2016-05-28 DIAGNOSIS — Z170 Estrogen receptor positive status [ER+]: Secondary | ICD-10-CM | POA: Diagnosis not present

## 2016-05-28 LAB — CBC WITH DIFFERENTIAL/PLATELET
BASO%: 0.6 % (ref 0.0–2.0)
BASOS ABS: 0 10*3/uL (ref 0.0–0.1)
EOS%: 1.4 % (ref 0.0–7.0)
Eosinophils Absolute: 0.1 10*3/uL (ref 0.0–0.5)
HCT: 39.9 % (ref 34.8–46.6)
HGB: 13.5 g/dL (ref 11.6–15.9)
LYMPH%: 18.7 % (ref 14.0–49.7)
MCH: 32.3 pg (ref 25.1–34.0)
MCHC: 33.9 g/dL (ref 31.5–36.0)
MCV: 95.3 fL (ref 79.5–101.0)
MONO#: 0.3 10*3/uL (ref 0.1–0.9)
MONO%: 6.2 % (ref 0.0–14.0)
NEUT#: 3.5 10*3/uL (ref 1.5–6.5)
NEUT%: 73.1 % (ref 38.4–76.8)
Platelets: 218 10*3/uL (ref 145–400)
RBC: 4.18 10*6/uL (ref 3.70–5.45)
RDW: 12.3 % (ref 11.2–14.5)
WBC: 4.8 10*3/uL (ref 3.9–10.3)
lymph#: 0.9 10*3/uL (ref 0.9–3.3)

## 2016-05-28 LAB — COMPREHENSIVE METABOLIC PANEL
ALT: 15 U/L (ref 0–55)
AST: 18 U/L (ref 5–34)
Albumin: 3.7 g/dL (ref 3.5–5.0)
Alkaline Phosphatase: 85 U/L (ref 40–150)
Anion Gap: 10 mEq/L (ref 3–11)
BUN: 14.6 mg/dL (ref 7.0–26.0)
CALCIUM: 9.9 mg/dL (ref 8.4–10.4)
CHLORIDE: 104 meq/L (ref 98–109)
CO2: 29 mEq/L (ref 22–29)
Creatinine: 0.8 mg/dL (ref 0.6–1.1)
EGFR: 82 mL/min/{1.73_m2} — AB (ref >90)
Glucose: 93 mg/dl (ref 70–140)
SODIUM: 142 meq/L (ref 136–145)
Total Bilirubin: 0.53 mg/dL (ref 0.20–1.20)
Total Protein: 7.5 g/dL (ref 6.4–8.3)

## 2016-05-28 MED ORDER — SODIUM CHLORIDE 0.9 % IV SOLN
Freq: Once | INTRAVENOUS | Status: AC
  Administered 2016-05-28: 12:00:00 via INTRAVENOUS

## 2016-05-28 MED ORDER — TRASTUZUMAB CHEMO 150 MG IV SOLR
6.0000 mg/kg | Freq: Once | INTRAVENOUS | Status: AC
  Administered 2016-05-28: 441 mg via INTRAVENOUS
  Filled 2016-05-28: qty 21

## 2016-05-28 MED ORDER — ANASTROZOLE 1 MG PO TABS: 1.0000 mg | ORAL_TABLET | Freq: Every day | ORAL | 3 refills | Status: DC

## 2016-05-28 MED ORDER — ACETAMINOPHEN 325 MG PO TABS
ORAL_TABLET | ORAL | Status: AC
  Filled 2016-05-28: qty 2

## 2016-05-28 MED ORDER — SODIUM CHLORIDE 0.9% FLUSH
10.0000 mL | INTRAVENOUS | Status: DC | PRN
  Administered 2016-05-28: 10 mL
  Filled 2016-05-28: qty 10

## 2016-05-28 MED ORDER — HEPARIN SOD (PORK) LOCK FLUSH 100 UNIT/ML IV SOLN
500.0000 [IU] | Freq: Once | INTRAVENOUS | Status: AC | PRN
  Administered 2016-05-28: 500 [IU]
  Filled 2016-05-28: qty 5

## 2016-05-28 MED ORDER — DIPHENHYDRAMINE HCL 25 MG PO CAPS
ORAL_CAPSULE | ORAL | Status: AC
  Filled 2016-05-28: qty 2

## 2016-05-28 MED ORDER — ACETAMINOPHEN 325 MG PO TABS
650.0000 mg | ORAL_TABLET | Freq: Once | ORAL | Status: AC
  Administered 2016-05-28: 650 mg via ORAL

## 2016-05-28 MED ORDER — SODIUM CHLORIDE 0.9 % IV SOLN
INTRAVENOUS | Status: DC
  Administered 2016-05-28: 12:00:00 via INTRAVENOUS

## 2016-05-28 MED ORDER — DIPHENHYDRAMINE HCL 25 MG PO CAPS
50.0000 mg | ORAL_CAPSULE | Freq: Once | ORAL | Status: AC
  Administered 2016-05-28: 50 mg via ORAL

## 2016-05-28 NOTE — Patient Instructions (Signed)
[facility] Discharge Instructions for Patients Receiving Chemotherapy  Today you received the following chemotherapy agents: trastuzumab (Herceptin)  To help prevent nausea and vomiting after your treatment, we encourage you to take your nausea medication as directed.  If you develop nausea and vomiting that is not controlled by your nausea medication, call the clinic.   BELOW ARE SYMPTOMS THAT SHOULD BE REPORTED IMMEDIATELY:  *FEVER GREATER THAN 100.5 F  *CHILLS WITH OR WITHOUT FEVER  NAUSEA AND VOMITING THAT IS NOT CONTROLLED WITH YOUR NAUSEA MEDICATION  *UNUSUAL SHORTNESS OF BREATH  *UNUSUAL BRUISING OR BLEEDING  TENDERNESS IN MOUTH AND THROAT WITH OR WITHOUT PRESENCE OF ULCERS  *URINARY PROBLEMS  *BOWEL PROBLEMS  UNUSUAL RASH Items with * indicate a potential emergency and should be followed up as soon as possible.  Feel free to call the clinic you have any questions or concerns. The clinic phone number is [phone].  Please show the CHEMO ALERT CARD at check-in to the Emergency Department and triage nurse.   

## 2016-05-28 NOTE — Assessment & Plan Note (Signed)
Left lumpectomy 09/25/2015: IDC with DCIS, 1.2 cm, margins negative, 0/4 lymph nodes negative, grade 3, ER 70%, PR 5%, HER-2 positive ratio 3.45, Ki-67 10%, T1 CN 0 stage IA  Treatment plan: 1. Adjuvant chemotherapy with TCH 6 cycles 10/31/2015- 2/9/2018followed by Herceptin every 3 weeks for 1 year 2. Followed by adjuvant radiation 03/10/2016-  04/23/2016  3. Followed by adjuvant antiestrogen therapy ---------------------------------------------------------------------------------------------------------------------------------- Current treatment: Herceptin maintenance Herceptin toxicities: None Prior chemotherapy toxicities: Cytopenias, monitoring closely I discussed the role of adjuvant antiestrogen therapy in the treatment plan.  Severe hot flashes:  Effexor XR today. Anastrozole counseling:We discussed the risks and benefits of anti-estrogen therapy with aromatase inhibitors. These include but not limited to insomnia, hot flashes, mood changes, vaginal dryness, bone density loss, and weight gain. We strongly believe that the benefits far outweigh the risks. Patient understands these risks and consented to starting treatment. Planned treatment duration is 5-7 years.  Return to clinic every 3 weeks for Herceptin every 6 weeks to see me as well

## 2016-05-28 NOTE — Telephone Encounter (Signed)
Gave patient avs report and appointments for June and July.  °

## 2016-05-28 NOTE — Progress Notes (Signed)
Patient Care Team: Lavada Mesi as PCP - General (Family Medicine) Erroll Luna, MD as Consulting Physician (General Surgery) Nicholas Lose, MD as Consulting Physician (Hematology and Oncology) Gery Pray, MD as Consulting Physician (Radiation Oncology)  DIAGNOSIS:  Encounter Diagnosis  Name Primary?  . Malignant neoplasm of lower-outer quadrant of left breast of female, estrogen receptor positive ([place])     SUMMARY OF ONCOLOGIC HISTORY:   Breast cancer of lower-outer quadrant of left female breast ([place])   09/10/2015 Initial Diagnosis    Screening detected left breast asymmetry: 2 adjacent masses 4:00: 8 mm and 3:30: 9 mm; axilla negative, biopsy grade 2-3 IDC, ER 70%, PR 5%, Ki-67 10%, HER-2 positive ratio 3.41, T1BN0 stage IA      09/25/2015 Surgery    Left lumpectomy: IDC with DCIS, 1.2 cm, margins negative, 0/4 lymph nodes negative, grade 3, ER 70%, PR 5%, HER-2 positive ratio 3.45, Ki-67 10%, T1 CN 0 stage IA      10/31/2015 - 02/13/2016 Chemotherapy    Adjuvant chemotherapy with TCH 6 cycles followed by Herceptin maintenance for 1 year      03/10/2016 - 04/23/2016 Radiation Therapy    Adjuvant radiation therapy      05/28/2016 -  Anti-estrogen oral therapy    Anastrozole 1 mg daily       CHIEF COMPLIANT: Follow-up after radiation therapy, currently on Herceptin maintenance  INTERVAL HISTORY: Jenna [name] is a 56 year old with above-mentioned history of left breast cancer currently on Herceptin maintenance. She is tolerating it fairly well. She completed radiation and has recovered very well from it. She had hot flashes and previous to be discussed at Effexor but she decided not to take it. The hot flashes do not seem to be affecting her quality of life. She denies any pain or discomfort in the breast.   REVIEW OF SYSTEMS:   Constitutional: Denies fevers, chills or abnormal weight loss Eyes: Denies blurriness of vision Ears, nose, mouth, throat, and face:  Denies mucositis or sore throat Respiratory: Denies cough, dyspnea or wheezes Cardiovascular: Denies palpitation, chest discomfort Gastrointestinal:  Denies nausea, heartburn or change in bowel habits Skin: Denies abnormal skin rashes Lymphatics: Denies new lymphadenopathy or easy bruising Neurological:Denies numbness, tingling or new weaknesses Behavioral/Psych: Mood is stable, no new changes  Extremities: No lower extremity edema Breast:  denies any pain or lumps or nodules in either breasts All other systems were reviewed with the patient and are negative.  I have reviewed the past medical history, past surgical history, social history and family history with the patient and they are unchanged from previous note.  ALLERGIES:  is allergic to latex and sulfa antibiotics.  MEDICATIONS:  Current Outpatient Prescriptions  Medication Sig Dispense Refill  . Albuterol Sulfate (PROAIR RESPICLICK) 300 (90 BASE) MCG/ACT AEPB Inhale 2 puffs into the lungs every 6 (six) hours. (Patient not taking: Reported on 10/06/2015) 3 each 3  . dicyclomine (BENTYL) 20 MG tablet Take 1 tablet (20 mg total) by mouth every 6 (six) hours. As needed for cramps in abdomen (Patient not taking: Reported on 12/10/2015) 30 tablet 0  . DULoxetine (CYMBALTA) 60 MG capsule Take 1 capsule (60 mg total) by mouth daily. 90 capsule 3  . emollient (BIAFINE) cream Apply topically 2 (two) times daily.    . hyaluronate sodium (RADIAPLEXRX) GEL Apply 1 application topically 2 (two) times daily.    Marland Kitchen levothyroxine (SYNTHROID, LEVOTHROID) 50 MCG tablet Take 1 tablet (50 mcg total) by mouth daily. 90 tablet  0  . lidocaine-prilocaine (EMLA) cream Apply to affected area once 30 g 3  . liothyronine (CYTOMEL) 25 MCG tablet TAKE 1 TABLET BY MOUTH EVERY DAY 30 tablet 5  . LORazepam (ATIVAN) 0.5 MG tablet Take 1 tablet (0.5 mg total) by mouth at bedtime. (Patient not taking: Reported on 03/16/2016) 30 tablet 0  . omeprazole (PRILOSEC) 20 MG  capsule Take 1 capsule (20 mg total) by mouth 2 (two) times daily before a meal. (Patient not taking: Reported on 03/16/2016) 180 capsule 4  . ondansetron (ZOFRAN) 8 MG tablet TAKE 1 TABLET BY MOUTH TWICE A DAY AS NEEDED FOR NAUSEA/VOMITING. START 3 DAYS AFTER CHEMO (Patient not taking: Reported on 03/16/2016) 30 tablet 1  . oxyCODONE-acetaminophen (ROXICET) 5-325 MG tablet Take 1-2 tablets by mouth every 4 (four) hours as needed. (Patient not taking: Reported on 12/10/2015) 30 tablet 0  . venlafaxine XR (EFFEXOR-XR) 37.5 MG 24 hr capsule Take 1 capsule (37.5 mg total) by mouth daily with breakfast. 30 capsule 3   No current facility-administered medications for this visit.     PHYSICAL EXAMINATION: ECOG PERFORMANCE STATUS: 1 - Symptomatic but completely ambulatory  Vitals:   05/28/16 1013  BP: 129/72  Pulse: 78  Resp: 18  Temp: 97.9 F (36.6 C)   Filed Weights   05/28/16 1013  Weight: 165 lb 9.6 oz (75.1 kg)    GENERAL:alert, no distress and comfortable SKIN: skin color, texture, turgor are normal, no rashes or significant lesions EYES: normal, Conjunctiva are pink and non-injected, sclera clear OROPHARYNX:no exudate, no erythema and lips, buccal mucosa, and tongue normal  NECK: supple, thyroid normal size, non-tender, without nodularity LYMPH:  no palpable lymphadenopathy in the cervical, axillary or inguinal LUNGS: clear to auscultation and percussion with normal breathing effort HEART: regular rate & rhythm and no murmurs and no lower extremity edema ABDOMEN:abdomen soft, non-tender and normal bowel sounds MUSCULOSKELETAL:no cyanosis of digits and no clubbing  NEURO: alert & oriented x 3 with fluent speech, no focal motor/sensory deficits EXTREMITIES: No lower extremity edema   LABORATORY DATA:  I have reviewed the data as listed   Chemistry      Component Value Date/Time   NA 142 05/28/2016 0947   K 5.3 No visable hemolysis (H) 05/28/2016 0947   CL 102 08/12/2015 0959     CO2 29 05/28/2016 0947   BUN 14.6 05/28/2016 0947   CREATININE 0.8 05/28/2016 0947      Component Value Date/Time   CALCIUM 9.9 05/28/2016 0947   ALKPHOS 85 05/28/2016 0947   AST 18 05/28/2016 0947   ALT 15 05/28/2016 0947   BILITOT 0.53 05/28/2016 0947       Lab Results  Component Value Date   WBC 4.8 05/28/2016   HGB 13.5 05/28/2016   HCT 39.9 05/28/2016   MCV 95.3 05/28/2016   PLT 218 05/28/2016   NEUTROABS 3.5 05/28/2016    ASSESSMENT & PLAN:  Breast cancer of lower-outer quadrant of left female breast ([place]) Left lumpectomy 09/25/2015: IDC with DCIS, 1.2 cm, margins negative, 0/4 lymph nodes negative, grade 3, ER 70%, PR 5%, HER-2 positive ratio 3.45, Ki-67 10%, T1 CN 0 stage IA  Treatment plan: 1. Adjuvant chemotherapy with TCH 6 cycles 10/31/2015- 2/9/2018followed by Herceptin every 3 weeks for 1 year 2. Followed by adjuvant radiation 03/10/2016-  04/23/2016  3. Followed by adjuvant antiestrogen therapy started 05/28/2016 ---------------------------------------------------------------------------------------------------------------------------------- Current treatment: Herceptin maintenance Herceptin toxicities: None Prior chemotherapy toxicities: Cytopenias, monitoring closely I discussed the role of adjuvant  antiestrogen therapy in the treatment plan.  Severe hot flashes: I previously prescribed Effexor but she refused to fill the prescription. She does reported that they are not so bad.  Anastrozole counseling:We discussed the risks and benefits of anti-estrogen therapy with aromatase inhibitors. These include but not limited to insomnia, hot flashes, mood changes, vaginal dryness, bone density loss, and weight gain. We strongly believe that the benefits far outweigh the risks. Patient understands these risks and consented to starting treatment. Planned treatment duration is 5-7 years.  Return to clinic every 3 weeks for Herceptin. I will see her back in  July for follow-up.  I spent 25 minutes talking to the patient of which more than half was spent in counseling and coordination of care.  No orders of the defined types were placed in this encounter.  The patient has a good understanding of the overall plan. she agrees with it. she will call with any problems that may develop before the next visit here.   Rulon Eisenmenger, MD 05/28/16

## 2016-06-02 ENCOUNTER — Encounter: Payer: Self-pay | Admitting: Oncology

## 2016-06-03 ENCOUNTER — Encounter: Payer: Self-pay | Admitting: Radiation Oncology

## 2016-06-03 ENCOUNTER — Ambulatory Visit
Admission: RE | Admit: 2016-06-03 | Discharge: 2016-06-03 | Disposition: A | Discharge status: Home / Self Care | Payer: BLUE CROSS/BLUE SHIELD | Source: Ambulatory Visit | Attending: Radiation Oncology | Admitting: Radiation Oncology

## 2016-06-03 VITALS — BP 121/78 | HR 80 | Temp 98.3°F | Ht 62.0 in | Wt 167.0 lb

## 2016-06-03 DIAGNOSIS — C50512 Malignant neoplasm of lower-outer quadrant of left female breast: Secondary | ICD-10-CM | POA: Insufficient documentation

## 2016-06-03 DIAGNOSIS — Z170 Estrogen receptor positive status [ER+]: Secondary | ICD-10-CM | POA: Insufficient documentation

## 2016-06-03 NOTE — Progress Notes (Signed)
Jenna [name] is here for follow up to her left breast.  She denies having pain other than sharp occasional pains in her left breast.  She denies having any fatigue.  She is taking Arimidex.  She reports having night sweats and also light headed.  The skin on her left breast has hyperpigmentation.  BP 121/78 (BP Location: Right Arm, Patient Position: Sitting)   Pulse 80   Temp 98.3 F (36.8 C) (Oral)   Ht 5\' 2"  (1.575 m)   Wt 167 lb (75.8 kg)   SpO2 99%   BMI 30.54 kg/m    Wt Readings from Last 3 Encounters:  06/03/16 167 lb (75.8 kg)  05/28/16 165 lb 9.6 oz (75.1 kg)  04/16/16 165 lb 3.2 oz (74.9 kg)

## 2016-06-03 NOTE — Progress Notes (Signed)
  Radiation Oncology         [phone]) [phone] ________________________________  Name: Jenna [name] MRN: [id number]  Date: 06/03/2016  DOB: [birth year]  Follow-Up Visit Note  CC: Donella Stade, PA-C  Donella Stade, PA-C    ICD-9-CM ICD-10-CM   1. Malignant neoplasm of lower-outer quadrant of left breast of female, estrogen receptor positive ([facility]) 174.5 C50.512    V86.0 Z17.0     Diagnosis: Clinical, T1b Nx Mx, grade 3 invasive ductal carcinoma of the left breast (triple positive)  Interval Since Last Radiation:  1 month 03/10/16-04/23/16: 50.4 Gy to the left breast + 10 Gy boost  Narrative:  The patient returns today for routine follow-up. She reports occasional sharp pains in her left breast. She denies any other new pains. She denies fatigue. She is taking Arimidex. She reports night sweats and light headedness since starting this medication.                         ALLERGIES:  is allergic to latex and sulfa antibiotics.  Meds: Current Outpatient Prescriptions  Medication Sig Dispense Refill  . anastrozole (ARIMIDEX) 1 MG tablet Take 1 tablet (1 mg total) by mouth daily. 90 tablet 3  . DULoxetine (CYMBALTA) 60 MG capsule Take 1 capsule (60 mg total) by mouth daily. 90 capsule 3  . lidocaine-prilocaine (EMLA) cream Apply to affected area once 30 g 3  . liothyronine (CYTOMEL) 25 MCG tablet TAKE 1 TABLET BY MOUTH EVERY DAY 30 tablet 5  . omeprazole (PRILOSEC) 20 MG capsule Take 1 capsule (20 mg total) by mouth 2 (two) times daily before a meal. 180 capsule 4  . Albuterol Sulfate (PROAIR RESPICLICK) 280 (90 BASE) MCG/ACT AEPB Inhale 2 puffs into the lungs every 6 (six) hours. (Patient not taking: Reported on 10/06/2015) 3 each 3   No current facility-administered medications for this encounter.     Physical Findings: The patient is in no acute distress. Patient is alert and oriented.  height is 5\' 2"  (1.575 m) and weight is 167 lb (75.8 kg). Her oral temperature is 98.3 F (36.8  C). Her blood pressure is 121/78 and her pulse is 80. Her oxygen saturation is 99%. .  No significant changes. Lungs are clear to auscultation bilaterally. Heart has regular rate and rhythm. No palpable cervical, supraclavicular, or axillary adenopathy. Abdomen soft, non-tender, normal bowel sounds. Right breast no palpable mass or nipple discharge. Left breast mild hyperpigmentation changes, the skin has healed very well. No palpable mass or nipple discharge.  Lab Findings: Lab Results  Component Value Date   WBC 4.8 05/28/2016   HGB 13.5 05/28/2016   HCT 39.9 05/28/2016   MCV 95.3 05/28/2016   PLT 218 05/28/2016    Radiographic Findings: No results found.  Impression:  She has healed quickly from radiation therapy.  No evidence of recurrence on clinical exam today.  Plan: Since patient is doing so well, she will follow-up in radiation oncology prn. She will remain under close follow-up with medical oncology/surgery and remain on Arimidex and Herceptin.  ____________________________________    This document serves as a record of services personally performed by Gery Pray, MD. It was created on his behalf by Bethann Humble, a trained medical scribe. The creation of this record is based on the scribe's personal observations and the provider's statements to them. This document has been checked and approved by the attending provider.

## 2016-06-08 ENCOUNTER — Other Ambulatory Visit: Payer: Self-pay

## 2016-06-08 MED ORDER — VENLAFAXINE HCL ER 37.5 MG PO CP24: 37.5000 mg | ORAL_CAPSULE | Freq: Every day | ORAL | 0 refills | Status: DC

## 2016-06-08 NOTE — Progress Notes (Signed)
Received call from [facility] regarding requesting 90 day supply for pt venlafaxine medication. Called pt to confirm that she is still taking this medication. Pt states that she does not want to take it anymore for hot flashes at this time. Called [facility] and confirmed pt no longer wish to fill this script.

## 2016-06-10 ENCOUNTER — Encounter (HOSPITAL_COMMUNITY): Payer: BLUE CROSS/BLUE SHIELD | Admitting: Internal Medicine

## 2016-06-18 ENCOUNTER — Ambulatory Visit (HOSPITAL_BASED_OUTPATIENT_CLINIC_OR_DEPARTMENT_OTHER): Payer: BLUE CROSS/BLUE SHIELD

## 2016-06-18 VITALS — BP 148/79 | HR 75 | Temp 98.0°F | Resp 18

## 2016-06-18 DIAGNOSIS — Z170 Estrogen receptor positive status [ER+]: Principal | ICD-10-CM

## 2016-06-18 DIAGNOSIS — C50512 Malignant neoplasm of lower-outer quadrant of left female breast: Secondary | ICD-10-CM | POA: Diagnosis not present

## 2016-06-18 DIAGNOSIS — Z5112 Encounter for antineoplastic immunotherapy: Secondary | ICD-10-CM | POA: Diagnosis not present

## 2016-06-18 MED ORDER — HEPARIN SOD (PORK) LOCK FLUSH 100 UNIT/ML IV SOLN
500.0000 [IU] | Freq: Once | INTRAVENOUS | Status: AC | PRN
  Administered 2016-06-18: 500 [IU]
  Filled 2016-06-18: qty 5

## 2016-06-18 MED ORDER — SODIUM CHLORIDE 0.9 % IV SOLN
Freq: Once | INTRAVENOUS | Status: AC
  Administered 2016-06-18: 10:00:00 via INTRAVENOUS

## 2016-06-18 MED ORDER — SODIUM CHLORIDE 0.9% FLUSH
10.0000 mL | INTRAVENOUS | Status: DC | PRN
  Administered 2016-06-18: 10 mL
  Filled 2016-06-18: qty 10

## 2016-06-18 MED ORDER — ACETAMINOPHEN 325 MG PO TABS
650.0000 mg | ORAL_TABLET | Freq: Once | ORAL | Status: AC
  Administered 2016-06-18: 650 mg via ORAL

## 2016-06-18 MED ORDER — ACETAMINOPHEN 325 MG PO TABS
ORAL_TABLET | ORAL | Status: AC
  Filled 2016-06-18: qty 2

## 2016-06-18 MED ORDER — DIPHENHYDRAMINE HCL 25 MG PO CAPS
50.0000 mg | ORAL_CAPSULE | Freq: Once | ORAL | Status: AC
  Administered 2016-06-18: 50 mg via ORAL

## 2016-06-18 MED ORDER — DIPHENHYDRAMINE HCL 25 MG PO CAPS
ORAL_CAPSULE | ORAL | Status: AC
  Filled 2016-06-18: qty 2

## 2016-06-18 MED ORDER — TRASTUZUMAB CHEMO 150 MG IV SOLR
6.0000 mg/kg | Freq: Once | INTRAVENOUS | Status: AC
  Administered 2016-06-18: 441 mg via INTRAVENOUS
  Filled 2016-06-18: qty 21

## 2016-06-27 ENCOUNTER — Encounter: Payer: Self-pay | Admitting: Physician Assistant

## 2016-06-28 ENCOUNTER — Encounter: Payer: Self-pay | Admitting: Hematology and Oncology

## 2016-06-30 ENCOUNTER — Encounter: Payer: Self-pay | Admitting: Pharmacist

## 2016-06-30 ENCOUNTER — Encounter: Payer: Self-pay | Admitting: Physician Assistant

## 2016-06-30 ENCOUNTER — Other Ambulatory Visit: Payer: Self-pay

## 2016-06-30 DIAGNOSIS — N644 Mastodynia: Secondary | ICD-10-CM

## 2016-06-30 DIAGNOSIS — Z170 Estrogen receptor positive status [ER+]: Secondary | ICD-10-CM

## 2016-06-30 DIAGNOSIS — C50512 Malignant neoplasm of lower-outer quadrant of left female breast: Secondary | ICD-10-CM

## 2016-06-30 NOTE — Progress Notes (Signed)
Telephone documentation  Study code: [id number]  Spoke with patient over the phone today and reviewed with her the pharmacogenetic information listed below for the four genes of interest of the "Pharmacogenetic analysis of toxicities related to administration of taxanes in breast cancer patients" study. The information below was reviewed with the patient. Offered the patient the option of speaking with a [facility] genetic counselor and she was not interested in making an appointment. Provide my office number to the patient if she had additional questions.  **The buccal swab testing was NOT conducted at a CLIA validated lab. The patient was reminded that the information given was for informational proposes only and should NOT be used to make clinical decisions.   Gene Phenotype  CYP3A4 Normal metabolizer   CYP3A5 Poor metabolizer   SLCO1B1 Normal function  ABCB1 Intermediate  function    Jenna [name], PharmD, [place] Clinical Pharmacist- Oncology Pharmacy Resident [phone]

## 2016-07-09 ENCOUNTER — Ambulatory Visit (HOSPITAL_BASED_OUTPATIENT_CLINIC_OR_DEPARTMENT_OTHER): Payer: BLUE CROSS/BLUE SHIELD

## 2016-07-09 ENCOUNTER — Other Ambulatory Visit: Payer: BLUE CROSS/BLUE SHIELD

## 2016-07-09 ENCOUNTER — Ambulatory Visit: Payer: BLUE CROSS/BLUE SHIELD | Admitting: Hematology and Oncology

## 2016-07-09 VITALS — BP 136/84 | HR 83 | Temp 98.5°F | Resp 20

## 2016-07-09 DIAGNOSIS — Z5112 Encounter for antineoplastic immunotherapy: Secondary | ICD-10-CM | POA: Diagnosis not present

## 2016-07-09 DIAGNOSIS — C50512 Malignant neoplasm of lower-outer quadrant of left female breast: Secondary | ICD-10-CM | POA: Diagnosis not present

## 2016-07-09 DIAGNOSIS — Z170 Estrogen receptor positive status [ER+]: Principal | ICD-10-CM

## 2016-07-09 MED ORDER — SODIUM CHLORIDE 0.9% FLUSH
10.0000 mL | INTRAVENOUS | Status: DC | PRN
  Administered 2016-07-09: 10 mL
  Filled 2016-07-09: qty 10

## 2016-07-09 MED ORDER — DIPHENHYDRAMINE HCL 25 MG PO CAPS
50.0000 mg | ORAL_CAPSULE | Freq: Once | ORAL | Status: AC
  Administered 2016-07-09: 50 mg via ORAL

## 2016-07-09 MED ORDER — ACETAMINOPHEN 325 MG PO TABS
ORAL_TABLET | ORAL | Status: AC
  Filled 2016-07-09: qty 2

## 2016-07-09 MED ORDER — DIPHENHYDRAMINE HCL 25 MG PO CAPS
ORAL_CAPSULE | ORAL | Status: AC
  Filled 2016-07-09: qty 1

## 2016-07-09 MED ORDER — SODIUM CHLORIDE 0.9 % IV SOLN
Freq: Once | INTRAVENOUS | Status: AC
  Administered 2016-07-09: 11:00:00 via INTRAVENOUS

## 2016-07-09 MED ORDER — HEPARIN SOD (PORK) LOCK FLUSH 100 UNIT/ML IV SOLN
500.0000 [IU] | Freq: Once | INTRAVENOUS | Status: AC | PRN
  Administered 2016-07-09: 500 [IU]
  Filled 2016-07-09: qty 5

## 2016-07-09 MED ORDER — TRASTUZUMAB CHEMO 150 MG IV SOLR
450.0000 mg | Freq: Once | INTRAVENOUS | Status: AC
  Administered 2016-07-09: 450 mg via INTRAVENOUS
  Filled 2016-07-09: qty 21.43

## 2016-07-09 MED ORDER — ACETAMINOPHEN 325 MG PO TABS
650.0000 mg | ORAL_TABLET | Freq: Once | ORAL | Status: AC
  Administered 2016-07-09: 650 mg via ORAL

## 2016-07-12 ENCOUNTER — Other Ambulatory Visit: Payer: Self-pay | Admitting: Family Medicine

## 2016-07-12 DIAGNOSIS — N644 Mastodynia: Secondary | ICD-10-CM

## 2016-07-12 DIAGNOSIS — Z853 Personal history of malignant neoplasm of breast: Secondary | ICD-10-CM

## 2016-07-16 ENCOUNTER — Other Ambulatory Visit (HOSPITAL_COMMUNITY)
Admission: RE | Admit: 2016-07-16 | Discharge: 2016-07-16 | Disposition: A | Discharge status: Home / Self Care | Payer: BLUE CROSS/BLUE SHIELD | Source: Ambulatory Visit | Attending: Physician Assistant | Admitting: Physician Assistant

## 2016-07-16 ENCOUNTER — Encounter: Payer: Self-pay | Admitting: Physician Assistant

## 2016-07-16 ENCOUNTER — Ambulatory Visit (INDEPENDENT_AMBULATORY_CARE_PROVIDER_SITE_OTHER): Payer: BLUE CROSS/BLUE SHIELD | Admitting: Physician Assistant

## 2016-07-16 VITALS — BP 131/81 | HR 91 | Ht 62.0 in | Wt 170.0 lb

## 2016-07-16 DIAGNOSIS — Z13220 Encounter for screening for lipoid disorders: Secondary | ICD-10-CM | POA: Diagnosis not present

## 2016-07-16 DIAGNOSIS — Z170 Estrogen receptor positive status [ER+]: Secondary | ICD-10-CM

## 2016-07-16 DIAGNOSIS — E875 Hyperkalemia: Secondary | ICD-10-CM | POA: Diagnosis not present

## 2016-07-16 DIAGNOSIS — N952 Postmenopausal atrophic vaginitis: Secondary | ICD-10-CM | POA: Diagnosis not present

## 2016-07-16 DIAGNOSIS — C50512 Malignant neoplasm of lower-outer quadrant of left female breast: Secondary | ICD-10-CM

## 2016-07-16 DIAGNOSIS — Z124 Encounter for screening for malignant neoplasm of cervix: Secondary | ICD-10-CM

## 2016-07-16 DIAGNOSIS — E038 Other specified hypothyroidism: Secondary | ICD-10-CM

## 2016-07-16 DIAGNOSIS — N644 Mastodynia: Secondary | ICD-10-CM | POA: Diagnosis not present

## 2016-07-16 DIAGNOSIS — F321 Major depressive disorder, single episode, moderate: Secondary | ICD-10-CM | POA: Diagnosis not present

## 2016-07-16 DIAGNOSIS — D259 Leiomyoma of uterus, unspecified: Secondary | ICD-10-CM | POA: Diagnosis not present

## 2016-07-16 DIAGNOSIS — R102 Pelvic and perineal pain: Secondary | ICD-10-CM | POA: Diagnosis not present

## 2016-07-16 DIAGNOSIS — E782 Mixed hyperlipidemia: Secondary | ICD-10-CM | POA: Diagnosis not present

## 2016-07-16 DIAGNOSIS — E063 Autoimmune thyroiditis: Secondary | ICD-10-CM

## 2016-07-16 DIAGNOSIS — E039 Hypothyroidism, unspecified: Secondary | ICD-10-CM | POA: Diagnosis not present

## 2016-07-16 LAB — COMPLETE METABOLIC PANEL WITH GFR
ALBUMIN: 4.2 g/dL (ref 3.6–5.1)
ALK PHOS: 75 U/L (ref 33–130)
ALT: 24 U/L (ref 6–29)
AST: 26 U/L (ref 10–35)
BILIRUBIN TOTAL: 0.6 mg/dL (ref 0.2–1.2)
BUN: 20 mg/dL (ref 7–25)
CO2: 29 mmol/L (ref 20–31)
CREATININE: 0.92 mg/dL (ref 0.50–1.05)
Calcium: 9.8 mg/dL (ref 8.6–10.4)
Chloride: 100 mmol/L (ref 98–110)
GFR, EST NON AFRICAN AMERICAN: 71 mL/min (ref >=60)
GFR, Est African American: 82 mL/min (ref >=60)
GLUCOSE: 103 mg/dL — AB (ref 65–99)
Potassium: 5.8 mmol/L — ABNORMAL HIGH (ref 3.5–5.3)
SODIUM: 140 mmol/L (ref 135–146)
TOTAL PROTEIN: 7.1 g/dL (ref 6.1–8.1)

## 2016-07-16 LAB — LIPID PANEL W/REFLEX DIRECT LDL
CHOLESTEROL: 282 mg/dL — AB (ref <200)
HDL: 86 mg/dL (ref >50)
LDL-Cholesterol: 167 mg/dL — ABNORMAL HIGH
NON-HDL CHOLESTEROL (CALC): 196 mg/dL — AB (ref <130)
Total CHOL/HDL Ratio: 3.3 Ratio (ref <5.0)
Triglycerides: 147 mg/dL (ref <150)

## 2016-07-16 LAB — TSH: TSH: 7.16 mIU/L — ABNORMAL HIGH

## 2016-07-16 MED ORDER — DULOXETINE HCL 30 MG PO CPEP: 30.0000 mg | ORAL_CAPSULE | Freq: Every day | ORAL | 5 refills | Status: DC

## 2016-07-16 NOTE — Patient Instructions (Addendum)
Call the [facility] [phone]  For pelvic/transvaginal ultraound [phone]  Increase Cymbalta to 90mg  daily or 60mg  in the morning and 30mg  at night (expiriment with what works for you) Follow-up in 4 weeks

## 2016-07-16 NOTE — Progress Notes (Signed)
HPI:                                                                Jenna [name] is a 56 y.o. female who presents to [facility]: [facility] today to for Pap smear only  Patient currently on Herceptin and Arimidex for breast cancer. Completed chemotherapy and radiation for grade 3 invasive ductal carcinoma of the left breast (triple positive). Followed Q3weeks by Oncology.  Depression/Anxiety: taking Cymbalta 60mg  without difficulty. She endorses depressed mood and anhedonia. States she has gained a lot of weight since her cancer diagnosis, which upsets her. She has anxiety about cancer spreading to her right breast and abdomen. Denies symptoms of mania/hypomania. Denies suicidal thinking. Denies auditory/visual hallucinations.  Right breast discomfort: patient reports intermittent itching of the areola and states areola has become lighter in color over the last month. Denies nipple discharge or bleeding. Denies rash. Denies palpable mass.  Health Maintenance Health Maintenance  Topic Date Due  . PAP SMEAR  05/18/2016  . INFLUENZA VACCINE  08/04/2016  . MAMMOGRAM  09/24/2017  . COLONOSCOPY  06/26/2018  . TETANUS/TDAP  02/26/2024  . Hepatitis C Screening  Completed  . HIV Screening  Completed    GYN/Sexual Health  Menstrual status: postmenopausal  LMP: at least 15 years  Menses: n/a  Last pap smear: 3 years ago per patient  History of abnormal pap smears: HPV+   Sexually active: yes   Past Medical History:  Diagnosis Date  . Anxiety   . Asthma   . Breast cancer of lower-outer quadrant of left female breast ([place]) 09/12/2015  . Complication of anesthesia    hard time waking up with gallbladder  . GERD (gastroesophageal reflux disease)   . Hashimoto's disease   . History of external beam radiation therapy 03/10/16-04/23/16   left breast/50.4 Gy in 28 fractions, left breast boost/10 Gy in 5 fractions  . Hypothyroidism   . Irritable  bowel   . Uterine displacement    Past Surgical History:  Procedure Laterality Date  . BREAST LUMPECTOMY WITH NEEDLE LOCALIZATION AND AXILLARY SENTINEL LYMPH NODE BX Left 09/25/2015   Procedure: BREAST LUMPECTOMY WITH NEEDLE LOCALIZATION X'S 2 AND AXILLARY SENTINEL LYMPH NODE BX;  Surgeon: Erroll Luna, MD;  Location: [facility];  Service: General;  Laterality: Left;  . CHOLECYSTECTOMY OPEN    . disk fusion    . ENDOMETRIAL ABLATION    . neck fusion    . PORTACATH PLACEMENT Right 09/25/2015   Procedure: INSERTION PORT-A-CATH;  Surgeon: Erroll Luna, MD;  Location: [facility];  Service: General;  Laterality: Right;   Social History  Substance Use Topics  . Smoking status: Former Research scientist (life sciences)  . Smokeless tobacco: Never Used  . Alcohol use 0.6 oz/week    1 Standard drinks or equivalent per week     Comment: social   family history includes Cancer in her maternal grandfather; Diabetes in her mother; Hyperlipidemia in her mother; Hypertension in her mother; Kidney disease in her mother.  ROS: Review of Systems  Constitutional: Positive for diaphoresis. Negative for chills, fever and weight loss.  Respiratory: Negative.   Cardiovascular: Negative.   Gastrointestinal: Negative for blood in stool.       +  abdominal distention  Musculoskeletal: Negative.   Skin: Positive for itching (right breast).  Neurological: Negative.   Psychiatric/Behavioral: Positive for depression. Negative for hallucinations, substance abuse and suicidal ideas. The patient is nervous/anxious.      Medications: Current Outpatient Prescriptions  Medication Sig Dispense Refill  . Albuterol Sulfate (PROAIR RESPICLICK) 638 (90 BASE) MCG/ACT AEPB Inhale 2 puffs into the lungs every 6 (six) hours. 3 each 3  . anastrozole (ARIMIDEX) 1 MG tablet Take 1 tablet (1 mg total) by mouth daily. 90 tablet 3  . DULoxetine (CYMBALTA) 60 MG capsule Take 1 capsule (60 mg total) by mouth daily. 90  capsule 3  . lidocaine-prilocaine (EMLA) cream Apply to affected area once 30 g 3  . liothyronine (CYTOMEL) 25 MCG tablet TAKE 1 TABLET BY MOUTH EVERY DAY 30 tablet 5  . omeprazole (PRILOSEC) 20 MG capsule Take 1 capsule (20 mg total) by mouth 2 (two) times daily before a meal. 180 capsule 4   No current facility-administered medications for this visit.    Allergies  Allergen Reactions  . Latex Rash  . Sulfa Antibiotics Rash       Objective:  BP 131/81   Pulse 91   Ht 5\' 2"  (1.575 m)   Wt 170 lb (77.1 kg)   BMI 31.09 kg/m  Gen: well-groomed, cooperative, not ill-appearing, no distress HEENT: normal conjunctiva, trachea midline Pulm: Normal work of breathing, normal phonation Chest: right breast normal in appearance without rashes or lesions, areola appears normal, she is diffusely tender, no palpable masses GU: vulva without rashes or lesions, normal introitus and urethral meatus, vaginal mucosa with atrophic changes, normal discharge, Pap collected from cervical os, cervix non-friable without lesions, adnexa without masses, there is bilateral adenexal tenderness, uterus non-tender Neuro: alert and oriented x 3, EOM's intact, normal tone, no tremor MSK: moving all extremities, normal gait and station Lymph: no right axillary or supraclavicular adenopathy Skin: warm and dry, no rashes or lesions on exposed skin Psych: normal affect, euthymic mood, normal speech and thought content   No results found for this or any previous visit (from the past 72 hour(s)). No results found. Depression screen [facility] 2/9 07/16/2016 06/03/2016  Decreased Interest 2 0  Down, Depressed, Hopeless 2 0  PHQ - 2 Score 4 0  Altered sleeping 2 -  Tired, decreased energy 2 -  Change in appetite 3 -  Feeling bad or failure about yourself  2 -  Trouble concentrating 1 -  Moving slowly or fidgety/restless 2 -  Suicidal thoughts 0 -  PHQ-9 Score 16 -      Assessment and Plan: 56 y.o. female with    1. Encounter for Pap smear of cervix with HPV DNA cotesting - Cytology - PAP w/HPV cotest  2. Current moderate episode of major depressive disorder without prior episode ([facility]) - PHQ9 score 16 - increasing Cymbalta to 90mg  daily - follow-up in 4 weeks - DULoxetine (CYMBALTA) 30 MG capsule; Take 1 capsule (30 mg total) by mouth daily.  Dispense: 30 capsule; Refill: 5  3. Malignant neoplasm of lower-outer quadrant of left breast of female, estrogen receptor positive ([place]) - followed by Oncology  4. Hypothyroidism due to Hashimoto's - TSH - continue Liothyrinone daily  5. Encounter for screening for lipid disorder - Lipid Panel w/reflex Direct LDL  6. Hyperkalemia - COMPLETE METABOLIC PANEL WITH GFR  7. Adnexal tenderness - US Transvaginal Non-OB - US Pelvis Complete; Future  8. Uterine leiomyoma, unspecified location - US Transvaginal Non-OB -  US Pelvis Complete; Future  9. Breast tenderness - right breast tomo and ultrasound ordered - patient provided with contact info for breast center to follow-up  10. Postmenopausal atrophic vaginitis - active estrogen receptor positive cancer. Hormones contraindicated   No orders of the defined types were placed in this encounter.  Patient education and anticipatory guidance given Patient agrees with treatment plan Follow-up in 4 weeks or sooner as needed  Darlyne Russian PA-C

## 2016-07-19 LAB — CYTOLOGY - PAP
DIAGNOSIS: NEGATIVE
HPV: NOT DETECTED

## 2016-07-20 ENCOUNTER — Other Ambulatory Visit: Payer: BLUE CROSS/BLUE SHIELD

## 2016-07-20 MED ORDER — LIOTHYRONINE SODIUM 50 MCG PO TABS: 50.0000 ug | ORAL_TABLET | Freq: Every day | ORAL | 5 refills | Status: DC

## 2016-07-20 NOTE — Addendum Note (Signed)
Addended by: Nelson Chimes E on: 07/20/2016 10:05 AM   Modules accepted: Orders

## 2016-07-20 NOTE — Progress Notes (Signed)
Good morning Jenna [name],  Your pap smear was normal and HPV negative. Recommend repeat Pap in 5 years.  Your TSH was elevated, so we need to adjust your Liothyronine. I have sent a new prescription for 92mcg to your pharmacy. You can double up on what you have now. We should recheck your TSH in 6 weeks.  Your potassium is still moderately elevated. We need to watch this closely. I would like you to follow a potassium restricted diet (refer to https://www.kidney.org/atoz/content/potassium) and avoid all over-the-counter NSAIDs/pain relievers (Ibuprofen, Aleve, Motrin, aspirin etc.) We should recheck this before the weekend to make sure it is not worsening.   Lastly, your total and LDL cholesterol have increased compared to 1 year ago. Given that your LDL is above 160, you could benefit from medication that lowers your lipids, such as Atorvastatin. However, let's hold off on adding any new medications until we have your potassium and thyroid in a normal range. I recommend discussing statin therapy with Jade next month.  Best, Evlyn Clines

## 2016-07-21 ENCOUNTER — Ambulatory Visit (INDEPENDENT_AMBULATORY_CARE_PROVIDER_SITE_OTHER): Payer: BLUE CROSS/BLUE SHIELD

## 2016-07-21 DIAGNOSIS — D259 Leiomyoma of uterus, unspecified: Secondary | ICD-10-CM | POA: Diagnosis not present

## 2016-07-21 DIAGNOSIS — R102 Pelvic and perineal pain: Secondary | ICD-10-CM

## 2016-07-21 DIAGNOSIS — D251 Intramural leiomyoma of uterus: Secondary | ICD-10-CM | POA: Diagnosis not present

## 2016-07-21 NOTE — Progress Notes (Signed)
Hi Carol,  Your ultrasound showed a uterine fibroid. It was otherwise normal. There are no masses.  If this fibroid causes you pelvic pain or bleeding, I recommend following up with GYN to discuss treatment options, which would most likely be a hysterectomy.   Let me know if you have any questions or concerns.  Best, Evlyn Clines

## 2016-07-29 ENCOUNTER — Other Ambulatory Visit: Payer: Self-pay | Admitting: *Deleted

## 2016-07-29 DIAGNOSIS — Z170 Estrogen receptor positive status [ER+]: Principal | ICD-10-CM

## 2016-07-29 DIAGNOSIS — C50512 Malignant neoplasm of lower-outer quadrant of left female breast: Secondary | ICD-10-CM

## 2016-07-30 ENCOUNTER — Ambulatory Visit (HOSPITAL_BASED_OUTPATIENT_CLINIC_OR_DEPARTMENT_OTHER): Payer: BLUE CROSS/BLUE SHIELD

## 2016-07-30 ENCOUNTER — Ambulatory Visit (HOSPITAL_BASED_OUTPATIENT_CLINIC_OR_DEPARTMENT_OTHER): Payer: BLUE CROSS/BLUE SHIELD | Admitting: Hematology and Oncology

## 2016-07-30 ENCOUNTER — Ambulatory Visit: Payer: BLUE CROSS/BLUE SHIELD

## 2016-07-30 ENCOUNTER — Encounter: Payer: Self-pay | Admitting: Hematology and Oncology

## 2016-07-30 ENCOUNTER — Encounter: Payer: Self-pay | Admitting: *Deleted

## 2016-07-30 ENCOUNTER — Other Ambulatory Visit (HOSPITAL_BASED_OUTPATIENT_CLINIC_OR_DEPARTMENT_OTHER): Payer: BLUE CROSS/BLUE SHIELD

## 2016-07-30 DIAGNOSIS — Z170 Estrogen receptor positive status [ER+]: Secondary | ICD-10-CM

## 2016-07-30 DIAGNOSIS — C50512 Malignant neoplasm of lower-outer quadrant of left female breast: Secondary | ICD-10-CM | POA: Diagnosis not present

## 2016-07-30 DIAGNOSIS — Z5112 Encounter for antineoplastic immunotherapy: Secondary | ICD-10-CM | POA: Diagnosis not present

## 2016-07-30 DIAGNOSIS — Z95828 Presence of other vascular implants and grafts: Secondary | ICD-10-CM

## 2016-07-30 LAB — CBC WITH DIFFERENTIAL/PLATELET
BASO%: 0.2 % (ref 0.0–2.0)
BASOS ABS: 0 10*3/uL (ref 0.0–0.1)
EOS%: 1.5 % (ref 0.0–7.0)
Eosinophils Absolute: 0.1 10*3/uL (ref 0.0–0.5)
HCT: 38.7 % (ref 34.8–46.6)
HEMOGLOBIN: 12.7 g/dL (ref 11.6–15.9)
LYMPH%: 23.6 % (ref 14.0–49.7)
MCH: 31.8 pg (ref 25.1–34.0)
MCHC: 32.8 g/dL (ref 31.5–36.0)
MCV: 96.8 fL (ref 79.5–101.0)
MONO#: 0.4 10*3/uL (ref 0.1–0.9)
MONO%: 6.6 % (ref 0.0–14.0)
NEUT%: 68.1 % (ref 38.4–76.8)
NEUTROS ABS: 4 10*3/uL (ref 1.5–6.5)
Platelets: 214 10*3/uL (ref 145–400)
RBC: 4 10*6/uL (ref 3.70–5.45)
RDW: 13.7 % (ref 11.2–14.5)
WBC: 5.9 10*3/uL (ref 3.9–10.3)
lymph#: 1.4 10*3/uL (ref 0.9–3.3)

## 2016-07-30 LAB — COMPREHENSIVE METABOLIC PANEL
ALBUMIN: 3.6 g/dL (ref 3.5–5.0)
ALK PHOS: 85 U/L (ref 40–150)
ALT: 25 U/L (ref 0–55)
AST: 26 U/L (ref 5–34)
Anion Gap: 10 mEq/L (ref 3–11)
BILIRUBIN TOTAL: 0.51 mg/dL (ref 0.20–1.20)
BUN: 15.8 mg/dL (ref 7.0–26.0)
CO2: 26 mEq/L (ref 22–29)
CREATININE: 0.7 mg/dL (ref 0.6–1.1)
Calcium: 9.4 mg/dL (ref 8.4–10.4)
Chloride: 105 mEq/L (ref 98–109)
EGFR: >90 mL/min/{1.73_m2} (ref >90)
GLUCOSE: 93 mg/dL (ref 70–140)
Potassium: 4.4 mEq/L (ref 3.5–5.1)
SODIUM: 140 meq/L (ref 136–145)
TOTAL PROTEIN: 7.2 g/dL (ref 6.4–8.3)

## 2016-07-30 MED ORDER — SODIUM CHLORIDE 0.9% FLUSH
10.0000 mL | INTRAVENOUS | Status: DC | PRN
  Administered 2016-07-30: 10 mL
  Filled 2016-07-30: qty 10

## 2016-07-30 MED ORDER — ACETAMINOPHEN 325 MG PO TABS
ORAL_TABLET | ORAL | Status: AC
  Filled 2016-07-30: qty 2

## 2016-07-30 MED ORDER — DIPHENHYDRAMINE HCL 25 MG PO CAPS
50.0000 mg | ORAL_CAPSULE | Freq: Once | ORAL | Status: AC
  Administered 2016-07-30: 50 mg via ORAL

## 2016-07-30 MED ORDER — HEPARIN SOD (PORK) LOCK FLUSH 100 UNIT/ML IV SOLN
500.0000 [IU] | Freq: Once | INTRAVENOUS | Status: AC | PRN
  Administered 2016-07-30: 500 [IU]
  Filled 2016-07-30: qty 5

## 2016-07-30 MED ORDER — SODIUM CHLORIDE 0.9 % IV SOLN
Freq: Once | INTRAVENOUS | Status: AC
  Administered 2016-07-30: 12:00:00 via INTRAVENOUS

## 2016-07-30 MED ORDER — ACETAMINOPHEN 325 MG PO TABS
650.0000 mg | ORAL_TABLET | Freq: Once | ORAL | Status: AC
  Administered 2016-07-30: 650 mg via ORAL

## 2016-07-30 MED ORDER — LETROZOLE 2.5 MG PO TABS: 2.5000 mg | ORAL_TABLET | Freq: Every day | ORAL | 0 refills | Status: DC

## 2016-07-30 MED ORDER — SODIUM CHLORIDE 0.9 % IV SOLN
450.0000 mg | Freq: Once | INTRAVENOUS | Status: AC
  Administered 2016-07-30: 450 mg via INTRAVENOUS
  Filled 2016-07-30: qty 21.43

## 2016-07-30 MED ORDER — SODIUM CHLORIDE 0.9% FLUSH
10.0000 mL | Freq: Once | INTRAVENOUS | Status: AC
  Administered 2016-07-30: 10 mL
  Filled 2016-07-30: qty 10

## 2016-07-30 MED ORDER — DIPHENHYDRAMINE HCL 25 MG PO CAPS
ORAL_CAPSULE | ORAL | Status: AC
  Filled 2016-07-30: qty 2

## 2016-07-30 NOTE — Assessment & Plan Note (Signed)
Left lumpectomy 09/25/2015: IDC with DCIS, 1.2 cm, margins negative, 0/4 lymph nodes negative, grade 3, ER 70%, PR 5%, HER-2 positive ratio 3.45, Ki-67 10%, T1 CN 0 stage IA  Treatment plan: 1. Adjuvant chemotherapy with TCH 6 cycles 10/31/2015- 2/9/2018followed by Herceptin every 3 weeks for 1 year 2. Followed by adjuvant radiation 03/10/2016-  04/23/2016  3. Followed by adjuvant antiestrogen therapy ---------------------------------------------------------------------------------------------------------------------------------- Current treatment: Herceptin maintenance Herceptin toxicities: None I discussed the results of PERSEPHONE clinical trial which showed 6 months of Herceptin to be equivalent to one year.   Severe hot flashes:  Effexor XR today. Anastrozole toxicities:   Return to clinic every 3 weeks for Herceptin every 6 weeks to see me as well

## 2016-07-30 NOTE — Patient Instructions (Signed)
[facility] Discharge Instructions for Patients Receiving Chemotherapy  Today you received the following chemotherapy agents: trastuzumab (Herceptin)  To help prevent nausea and vomiting after your treatment, we encourage you to take your nausea medication as directed.  If you develop nausea and vomiting that is not controlled by your nausea medication, call the clinic.   BELOW ARE SYMPTOMS THAT SHOULD BE REPORTED IMMEDIATELY:  *FEVER GREATER THAN 100.5 F  *CHILLS WITH OR WITHOUT FEVER  NAUSEA AND VOMITING THAT IS NOT CONTROLLED WITH YOUR NAUSEA MEDICATION  *UNUSUAL SHORTNESS OF BREATH  *UNUSUAL BRUISING OR BLEEDING  TENDERNESS IN MOUTH AND THROAT WITH OR WITHOUT PRESENCE OF ULCERS  *URINARY PROBLEMS  *BOWEL PROBLEMS  UNUSUAL RASH Items with * indicate a potential emergency and should be followed up as soon as possible.  Feel free to call the clinic you have any questions or concerns. The clinic phone number is [phone].  Please show the CHEMO ALERT CARD at check-in to the Emergency Department and triage nurse.   

## 2016-07-30 NOTE — Progress Notes (Signed)
Patient Care Team: Lavada Mesi as PCP - General (Family Medicine) Erroll Luna, MD as Consulting Physician (General Surgery) Nicholas Lose, MD as Consulting Physician (Hematology and Oncology) Gery Pray, MD as Consulting Physician (Radiation Oncology)  DIAGNOSIS:  Encounter Diagnosis  Name Primary?  . Malignant neoplasm of lower-outer quadrant of left breast of female, estrogen receptor positive ([place])     SUMMARY OF ONCOLOGIC HISTORY:   Breast cancer of lower-outer quadrant of left female breast ([place])   09/10/2015 Initial Diagnosis    Screening detected left breast asymmetry: 2 adjacent masses 4:00: 8 mm and 3:30: 9 mm; axilla negative, biopsy grade 2-3 IDC, ER 70%, PR 5%, Ki-67 10%, HER-2 positive ratio 3.41, T1BN0 stage IA      09/25/2015 Surgery    Left lumpectomy: IDC with DCIS, 1.2 cm, margins negative, 0/4 lymph nodes negative, grade 3, ER 70%, PR 5%, HER-2 positive ratio 3.45, Ki-67 10%, T1 CN 0 stage IA      10/31/2015 - 02/13/2016 Chemotherapy    Adjuvant chemotherapy with TCH 6 cycles followed by Herceptin maintenance for 1 year      03/10/2016 - 04/23/2016 Radiation Therapy    Adjuvant radiation therapy      05/28/2016 -  Anti-estrogen oral therapy    Anastrozole 1 mg daily       CHIEF COMPLIANT: Follow-up on Herceptin maintenance  INTERVAL HISTORY: Jenna [name] is a 56 year old with above-mentioned history of left breast cancer currently on adjuvant Herceptin. She is tolerating it fairly well. She was started on anastrozole therapy and has had profound hot flashes muscle cramps as well as mood swings and anxiety and panic episodes. She denies any fevers or chills.  REVIEW OF SYSTEMS:   Constitutional: Denies fevers, chills or abnormal weight loss Eyes: Denies blurriness of vision Ears, nose, mouth, throat, and face: Denies mucositis or sore throat Respiratory: Denies cough, dyspnea or wheezes Cardiovascular: Denies palpitation, chest  discomfort Gastrointestinal:  Denies nausea, heartburn or change in bowel habits Skin: Denies abnormal skin rashes Lymphatics: Denies new lymphadenopathy or easy bruising Neurological:Denies numbness, tingling or new weaknesses Behavioral/Psych: Mood is stable, no new changes  Extremities: No lower extremity edema All other systems were reviewed with the patient and are negative.  I have reviewed the past medical history, past surgical history, social history and family history with the patient and they are unchanged from previous note.  ALLERGIES:  is allergic to latex and sulfa antibiotics.  MEDICATIONS:  Current Outpatient Prescriptions  Medication Sig Dispense Refill  . Albuterol Sulfate (PROAIR RESPICLICK) 213 (90 BASE) MCG/ACT AEPB Inhale 2 puffs into the lungs every 6 (six) hours. 3 each 3  . anastrozole (ARIMIDEX) 1 MG tablet Take 1 tablet (1 mg total) by mouth daily. 90 tablet 3  . DULoxetine (CYMBALTA) 30 MG capsule Take 1 capsule (30 mg total) by mouth daily. 30 capsule 5  . DULoxetine (CYMBALTA) 60 MG capsule Take 1 capsule (60 mg total) by mouth daily. 90 capsule 3  . lidocaine-prilocaine (EMLA) cream Apply to affected area once 30 g 3  . liothyronine (CYTOMEL) 50 MCG tablet Take 1 tablet (50 mcg total) by mouth daily. 30 tablet 5  . omeprazole (PRILOSEC) 20 MG capsule Take 1 capsule (20 mg total) by mouth 2 (two) times daily before a meal. 180 capsule 4  . polyethylene glycol powder (MIRALAX) powder Take 1 Container by mouth once.    . Trastuzumab (HERCEPTIN IV) Inject into the vein.    . Triamcinolone Acetonide (  NASACORT ALLERGY 24HR NA) Place into the nose.     No current facility-administered medications for this visit.     PHYSICAL EXAMINATION: ECOG PERFORMANCE STATUS: 1 - Symptomatic but completely ambulatory  Vitals:   07/30/16 1042  BP: 135/79  Pulse: 85  Resp: 20  Temp: 97.9 F (36.6 C)   Filed Weights   07/30/16 1042  Weight: 170 lb 8 oz (77.3 kg)     GENERAL:alert, no distress and comfortable SKIN: skin color, texture, turgor are normal, no rashes or significant lesions EYES: normal, Conjunctiva are pink and non-injected, sclera clear OROPHARYNX:no exudate, no erythema and lips, buccal mucosa, and tongue normal  NECK: supple, thyroid normal size, non-tender, without nodularity LYMPH:  no palpable lymphadenopathy in the cervical, axillary or inguinal LUNGS: clear to auscultation and percussion with normal breathing effort HEART: regular rate & rhythm and no murmurs and no lower extremity edema ABDOMEN:abdomen soft, non-tender and normal bowel sounds MUSCULOSKELETAL:no cyanosis of digits and no clubbing  NEURO: alert & oriented x 3 with fluent speech, no focal motor/sensory deficits EXTREMITIES: No lower extremity edema  LABORATORY DATA:  I have reviewed the data as listed   Chemistry      Component Value Date/Time   NA 140 07/30/2016 0951   K 4.4 07/30/2016 0951   CL 100 07/16/2016 0917   CO2 26 07/30/2016 0951   BUN 15.8 07/30/2016 0951   CREATININE 0.7 07/30/2016 0951      Component Value Date/Time   CALCIUM 9.4 07/30/2016 0951   ALKPHOS 85 07/30/2016 0951   AST 26 07/30/2016 0951   ALT 25 07/30/2016 0951   BILITOT 0.51 07/30/2016 0951       Lab Results  Component Value Date   WBC 5.9 07/30/2016   HGB 12.7 07/30/2016   HCT 38.7 07/30/2016   MCV 96.8 07/30/2016   PLT 214 07/30/2016   NEUTROABS 4.0 07/30/2016    ASSESSMENT & PLAN:  Breast cancer of lower-outer quadrant of left female breast ([place]) Left lumpectomy 09/25/2015: IDC with DCIS, 1.2 cm, margins negative, 0/4 lymph nodes negative, grade 3, ER 70%, PR 5%, HER-2 positive ratio 3.45, Ki-67 10%, T1 CN 0 stage IA  Treatment plan: 1. Adjuvant chemotherapy with TCH 6 cycles 10/31/2015- 2/9/2018followed by Herceptin every 3 weeks for 1 year 2. Followed by adjuvant radiation 03/10/2016-  04/23/2016  3. Followed by adjuvant antiestrogen  therapy ---------------------------------------------------------------------------------------------------------------------------------- Current treatment: Herceptin maintenance Herceptin toxicities: None I discussed the results of PERSEPHONE clinical trial which showed 6 months of Herceptin to be equivalent to one year. Based on this trial we decided to stop Herceptin maintenance up-to-date.  She is requesting plastic surgery referral so that she can have bilateral breasts surgery to make them look even. I sent a referral to Dr. Leland Johns   Severe hot flashes:  Effexor XR. Anastrozole toxicities: 1. Severe hot flashes 2. severe mood swings 3. Severe muscle cramps Because of this we elected to change treatment from anastrozole to letrozole. I sent a 30 day supply of letrozole. If she tolerates it well then we'll send her 90 day prescription.  Weight gain: Patient is very concerned about this. I discussed different ways she can lose weight.  I will see her back in 6 months for follow-up and breast exams.   I spent 25 minutes talking to the patient of which more than half was spent in counseling and coordination of care.  No orders of the defined types were placed in this encounter.  The patient has  a good understanding of the overall plan. she agrees with it. she will call with any problems that may develop before the next visit here.   Rulon Eisenmenger, MD 07/30/16

## 2016-08-02 ENCOUNTER — Ambulatory Visit
Admission: RE | Admit: 2016-08-02 | Discharge: 2016-08-02 | Disposition: A | Discharge status: Home / Self Care | Payer: BLUE CROSS/BLUE SHIELD | Source: Ambulatory Visit | Attending: Family Medicine | Admitting: Family Medicine

## 2016-08-02 ENCOUNTER — Ambulatory Visit: Admission: RE | Admit: 2016-08-02 | Payer: BLUE CROSS/BLUE SHIELD | Source: Ambulatory Visit

## 2016-08-02 DIAGNOSIS — Z853 Personal history of malignant neoplasm of breast: Secondary | ICD-10-CM

## 2016-08-02 DIAGNOSIS — R928 Other abnormal and inconclusive findings on diagnostic imaging of breast: Secondary | ICD-10-CM | POA: Diagnosis not present

## 2016-08-02 DIAGNOSIS — N644 Mastodynia: Secondary | ICD-10-CM

## 2016-08-04 ENCOUNTER — Telehealth: Payer: Self-pay | Admitting: Hematology and Oncology

## 2016-08-04 NOTE — Telephone Encounter (Signed)
lvm to inform pt of SCP appt 10/ at  am per sch msg

## 2016-08-30 ENCOUNTER — Other Ambulatory Visit: Payer: Self-pay | Admitting: Hematology and Oncology

## 2016-09-14 ENCOUNTER — Other Ambulatory Visit: Payer: Self-pay | Admitting: Physician Assistant

## 2016-09-19 ENCOUNTER — Other Ambulatory Visit: Payer: Self-pay | Admitting: Physician Assistant

## 2016-09-21 DIAGNOSIS — Z923 Personal history of irradiation: Secondary | ICD-10-CM | POA: Insufficient documentation

## 2016-09-21 DIAGNOSIS — Z853 Personal history of malignant neoplasm of breast: Secondary | ICD-10-CM | POA: Insufficient documentation

## 2016-09-22 DIAGNOSIS — Z853 Personal history of malignant neoplasm of breast: Secondary | ICD-10-CM | POA: Diagnosis not present

## 2016-09-22 DIAGNOSIS — Z923 Personal history of irradiation: Secondary | ICD-10-CM | POA: Diagnosis not present

## 2016-09-22 DIAGNOSIS — N6489 Other specified disorders of breast: Secondary | ICD-10-CM | POA: Diagnosis not present

## 2016-10-03 ENCOUNTER — Other Ambulatory Visit: Payer: Self-pay | Admitting: Hematology and Oncology

## 2016-10-04 ENCOUNTER — Other Ambulatory Visit: Payer: Self-pay

## 2016-10-04 ENCOUNTER — Telehealth: Payer: Self-pay | Admitting: Hematology and Oncology

## 2016-10-04 MED ORDER — LETROZOLE 2.5 MG PO TABS: 2.5000 mg | ORAL_TABLET | Freq: Every day | ORAL | 0 refills | Status: DC

## 2016-10-04 NOTE — Telephone Encounter (Signed)
10/01/16 prescription refill °

## 2016-10-05 ENCOUNTER — Telehealth: Payer: Self-pay | Admitting: Hematology and Oncology

## 2016-10-05 ENCOUNTER — Other Ambulatory Visit: Payer: Self-pay

## 2016-10-05 MED ORDER — LETROZOLE 2.5 MG PO TABS: 2.5000 mg | ORAL_TABLET | Freq: Every day | ORAL | 0 refills | Status: DC

## 2016-10-05 NOTE — Telephone Encounter (Signed)
10/04/16 prescription refill

## 2016-10-10 NOTE — Progress Notes (Deleted)
CLINIC:  Survivorship   REASON FOR VISIT:  Routine follow-up post-treatment for a recent history of breast cancer.  BRIEF ONCOLOGIC HISTORY:    Breast cancer of lower-outer quadrant of left female breast ([place])   09/10/2015 Initial Diagnosis    Screening detected left breast asymmetry: 2 adjacent masses 4:00: 8 mm and 3:30: 9 mm; axilla negative, biopsy grade 2-3 IDC, ER 70%, PR 5%, Ki-67 10%, HER-2 positive ratio 3.41, T1BN0 stage IA      09/25/2015 Surgery    Left lumpectomy: IDC with DCIS, 1.2 cm, margins negative, 0/4 lymph nodes negative, grade 3, ER 70%, PR 5%, HER-2 positive ratio 3.45, Ki-67 10%, T1 CN 0 stage IA      10/31/2015 - 02/13/2016 Chemotherapy    Adjuvant chemotherapy with TCH 6 cycles followed by Herceptin maintenance for 1 year      03/10/2016 - 04/23/2016 Radiation Therapy    Adjuvant radiation therapy      05/28/2016 -  Anti-estrogen oral therapy    Anastrozole 1 mg daily       INTERVAL HISTORY:  Ms. Thurmon presents to the [facility] today for our initial meeting to review her survivorship care plan detailing her treatment course for breast cancer, as well as monitoring long-term side effects of that treatment, education regarding health maintenance, screening, and overall wellness and health promotion.     Overall, Ms. Nims reports feeling quite well since completing her radiation therapy approximately 3 months ago.  She ***    REVIEW OF SYSTEMS:  Review of Systems - Oncology Breast: Denies any new nodularity, masses, tenderness, nipple changes, or nipple discharge.      ONCOLOGY TREATMENT TEAM:  1. Surgeon:  Dr. Marland Kitchen at [facility] 2. Medical Oncologist: Dr. Marland Kitchen  3. Radiation Oncologist: Dr. Marland Kitchen    PAST MEDICAL/SURGICAL HISTORY:  Past Medical History:  Diagnosis Date  . Anxiety   . Asthma   . Breast cancer of lower-outer quadrant of left female breast ([place]) 09/12/2015  . Complication of anesthesia    hard time waking  up with gallbladder  . GERD (gastroesophageal reflux disease)   . Hashimoto's disease   . History of external beam radiation therapy 03/10/16-04/23/16   left breast/50.4 Gy in 28 fractions, left breast boost/10 Gy in 5 fractions  . Hypothyroidism   . Irritable bowel   . Uterine displacement    Past Surgical History:  Procedure Laterality Date  . BREAST LUMPECTOMY WITH NEEDLE LOCALIZATION AND AXILLARY SENTINEL LYMPH NODE BX Left 09/25/2015   Procedure: BREAST LUMPECTOMY WITH NEEDLE LOCALIZATION X'S 2 AND AXILLARY SENTINEL LYMPH NODE BX;  Surgeon: Erroll Luna, MD;  Location: [place];  Service: General;  Laterality: Left;  . CHOLECYSTECTOMY OPEN    . disk fusion    . ENDOMETRIAL ABLATION    . neck fusion    . PORTACATH PLACEMENT Right 09/25/2015   Procedure: INSERTION PORT-A-CATH;  Surgeon: Erroll Luna, MD;  Location: [place];  Service: General;  Laterality: Right;     ALLERGIES:  Allergies  Allergen Reactions  . Latex Rash  . Sulfa Antibiotics Rash     CURRENT MEDICATIONS:  Outpatient Encounter Prescriptions as of 10/11/2016  Medication Sig  . Albuterol Sulfate (PROAIR RESPICLICK) 782 (90 BASE) MCG/ACT AEPB Inhale 2 puffs into the lungs every 6 (six) hours.  . DULoxetine (CYMBALTA) 30 MG capsule Take 1 capsule (30 mg total) by mouth daily.  . DULoxetine (CYMBALTA) 60 MG capsule Take 1 capsule (60  mg total) by mouth daily.  Marland Kitchen letrozole (FEMARA) 2.5 MG tablet Take 1 tablet (2.5 mg total) by mouth daily.  Marland Kitchen lidocaine-prilocaine (EMLA) cream Apply to affected area once  . liothyronine (CYTOMEL) 50 MCG tablet Take 1 tablet (50 mcg total) by mouth daily.  Marland Kitchen omeprazole (PRILOSEC) 20 MG capsule Take 1 capsule (20 mg total) by mouth 2 (two) times daily before a meal.  . polyethylene glycol powder (MIRALAX) powder Take 1 Container by mouth once.  . Trastuzumab (HERCEPTIN IV) Inject into the vein.  . Triamcinolone Acetonide (NASACORT ALLERGY 24HR  NA) Place into the nose.   No facility-administered encounter medications on file as of 10/11/2016.      ONCOLOGIC FAMILY HISTORY:  Family History  Problem Relation Age of Onset  . Diabetes Mother   . Hypertension Mother   . Hyperlipidemia Mother   . Kidney disease Mother   . Cancer Maternal Grandfather      GENETIC COUNSELING/TESTING: ***  SOCIAL HISTORY:  Paola Aleshire is /single/married/divorced/widowed/separated and lives alone/with her spouse/family/friend in (city), [place].  She has (#) children and they live in (city).  Ms. Langhans is currently retired/disabled/working part-time/full-time as ***.  She denies any current or history of tobacco, alcohol, or illicit drug use.     PHYSICAL EXAMINATION:  Vital Signs:  There were no vitals filed for this visit. There were no vitals filed for this visit. General: Well-nourished, well-appearing female in no acute distress.  She is unaccompanied/accompanied in clinic by her ***** today.   HEENT: Head is normocephalic.  Pupils equal and reactive to light. Conjunctivae clear without exudate.  Sclerae anicteric. Oral mucosa is pink, moist.  Oropharynx is pink without lesions or erythema.  Lymph: No cervical, supraclavicular, or infraclavicular lymphadenopathy noted on palpation.  Cardiovascular: Regular rate and rhythm.Marland Kitchen Respiratory: Clear to auscultation bilaterally. Chest expansion symmetric; breathing non-labored.  GI: Abdomen soft and round; non-tender, non-distended. Bowel sounds normoactive.  GU: Deferred.  Neuro: No focal deficits. Steady gait.  Psych: Mood and affect normal and appropriate for situation.  Extremities: No edema. MSK: No focal spinal tenderness to palpation.  Full range of motion in bilateral upper extremities Skin: Warm and dry.  LABORATORY DATA:  None for this visit.  DIAGNOSTIC IMAGING:  None for this visit.      ASSESSMENT AND PLAN:  Ms.. Kochanowski is a pleasant 56 y.o. female with Stage IA  left breast invasive ductal carcinoma, ER+/PR+/HER2+, diagnosed in 09/2015, treated with lumpectomy, adjuvant chemotherapy, maintenance Trastuzumab, adjuvant radiation therapy, and anti-estrogen therapy with Letrozole (didn't tolerate anastrozole) beginning in 05/2016.  She presents to the Survivorship Clinic for our initial meeting and routine follow-up post-completion of treatment for breast cancer.    1. Stage IA left breast cancer:  Ms. Schwalb is continuing to recover from definitive treatment for breast cancer. She will follow-up with her medical oncologist, Dr. Lindi Adie 12/2016 with history and physical exam per surveillance protocol.  She will continue her anti-estrogen therapy with (drug). Thus far, she is tolerating the *** well, with minimal side effects. She was instructed to make Dr. Lindi Adie or myself aware if she begins to experience any worsening side effects of the medication and I could see her back in clinic to help manage those side effects, as needed. Though the incidence is low, there is an associated risk of endometrial cancer with anti-estrogen therapies like Tamoxifen.  Ms. Donoso was encouraged to contact Dr. Carrington Clamp or myself with any vaginal bleeding while taking Tamoxifen. Other side effects  of Tamoxifen were again reviewed with her as well. Today, a comprehensive survivorship care plan and treatment summary was reviewed with the patient today detailing her breast cancer diagnosis, treatment course, potential late/long-term effects of treatment, appropriate follow-up care with recommendations for the future, and patient education resources.  A copy of this summary, along with a letter will be sent to the patient's primary care provider via mail/fax/In Basket message after today's visit.    #. Problem(s) at Visit______________  #. Bone health:  Given Ms. Vesey age/history of breast cancer and her current treatment regimen including anti-estrogen therapy with _______, she is at  risk for bone demineralization.  Her last DEXA scan was **/**/20**, which showed (results).***  In the meantime, she was encouraged to increase her consumption of foods rich in calcium, as well as increase her weight-bearing activities.  She was given education on specific activities to promote bone health.  #. Cancer screening:  Due to Ms. Mensing history and her age, she should receive screening for skin cancers, colon cancer, and gynecologic cancers.  The information and recommendations are listed on the patient's comprehensive care plan/treatment summary and were reviewed in detail with the patient.    #. Health maintenance and wellness promotion: Ms. Dolloff was encouraged to consume 5-7 servings of fruits and vegetables per day. We reviewed the "Nutrition Rainbow" handout, as well as the handout "Take Control of Your Health and Reduce Your Cancer Risk" from the [employer].  She was also encouraged to engage in moderate to vigorous exercise for 30 minutes per day most days of the week. We discussed the [employer] fitness program, which is designed for cancer survivors to help them become more physically fit after cancer treatments.  She was instructed to limit her alcohol consumption and continue to abstain from tobacco use/***was encouraged stop smoking.     #. Support services/counseling: It is not uncommon for this period of the patient's cancer care trajectory to be one of many emotions and stressors.  We discussed an opportunity for her to participate in the next session of [facility] ("Finding Your New Normal") support group series designed for patients after they have completed treatment.   Ms. Carreno was encouraged to take advantage of our many other support services programs, support groups, and/or counseling in coping with her new life as a cancer survivor after completing anti-cancer treatment.  She was offered support today through active listening and expressive supportive  counseling.  She was given information regarding our available services and encouraged to contact me with any questions or for help enrolling in any of our support group/programs.    Dispo:   -Return to cancer center for follow up with Dr. Lindi Adie in 01/2017 -Mammogram due in 08/2016 -Follow up with surgery *** -She is welcome to return back to the Survivorship Clinic at any time; no additional follow-up needed at this time.  -Consider referral back to survivorship as a long-term survivor for continued surveillance  A total of (30) minutes of face-to-face time was spent with this patient with greater than 50% of that time in counseling and care-coordination.   Gardenia Phlegm, [place] [phone]   Note: PRIMARY CARE PROVIDER Donella Stade, [place] [phone]

## 2016-10-11 ENCOUNTER — Other Ambulatory Visit: Payer: Self-pay | Admitting: *Deleted

## 2016-10-11 ENCOUNTER — Encounter: Payer: BLUE CROSS/BLUE SHIELD | Admitting: Adult Health

## 2016-10-11 DIAGNOSIS — E063 Autoimmune thyroiditis: Principal | ICD-10-CM

## 2016-10-11 DIAGNOSIS — E038 Other specified hypothyroidism: Secondary | ICD-10-CM

## 2016-10-11 MED ORDER — LIOTHYRONINE SODIUM 50 MCG PO TABS: 50.0000 ug | ORAL_TABLET | Freq: Every day | ORAL | 0 refills | Status: DC

## 2016-10-14 DIAGNOSIS — Z923 Personal history of irradiation: Secondary | ICD-10-CM | POA: Diagnosis not present

## 2016-10-14 DIAGNOSIS — N6489 Other specified disorders of breast: Secondary | ICD-10-CM | POA: Diagnosis not present

## 2016-10-14 DIAGNOSIS — Z853 Personal history of malignant neoplasm of breast: Secondary | ICD-10-CM | POA: Diagnosis not present

## 2016-10-14 NOTE — H&P (Signed)
Subjective:     Patient ID: Jenna [name] is a 56 y.o. female.  HPI  Here for follow up discussion prior to planned bilateral mastopexy/reduction. Presented following screening MMG with left breast asymmetry and 2 adjacent masses left LOQ. Underwent partial mastectomy with SLN 09/2105 with Dr. Brantley Stage and final pathology 1.2 cm IDC with DCIS, 0/4 LN, ER/PR+, Her2+. Completed adjuvant chemotherapy and 6 months Herceptin. Completed adjuvant radiation 4.20.18. On AI.   Last MMG 7.30.18 normal. (right only)  Prior to lumpectomy D cup. Current - uses same bra but notes left smaller, not using insert. Wt up 25 lb since diagnosis  Homemaker, lives with husband, children are grown.  Review of Systems     Objective:   Physical Exam  Cardiovascular: Normal rate, regular rhythm and normal heart sounds.   Pulmonary/Chest: Effort normal and breath sounds normal.  Abdominal: Soft.  Lymphadenopathy:    She has no axillary adenopathy.  Skin:  Fitzpatrick 2   No masses, left breast hyperpigmentation, scar radial over left LOQ Right chest port Grade 3 ptosis bilateral Diminished sensation left NAC Right> left volume  SN to nipple R 32 L 30 cm BW R 18 L 18 cm Nipple to IMF R 11 L 11 cm     Assessment:     History left breast ca LOQ S/p lumpectomy, SLN Adjuvant radiation    Plan:     Patient happy with volume left breast, will try to match this.  Reviewed post radiation will have higher risk wound healing problems on left, may note less aging or development ptosis on radiated left.  Plan bilateral mastopexy/reduction with anchor type scars, OP surgery, drains, post operative visits and limitations, recovery. Diminished sensation nipple and breast skin, risk of nipple loss, wound healing problems, asymmetry, incidental carcinoma, changes with wt gain/loss, aging, unacceptable cosmetic appearance reviewed. Additional risks including but not limited to bleeding, infection,  seroma, hematoma, fat necrosis, DVT/PE, cardiopulmonary complications, damage to adjacent structures reviewed.  Plan removal right chest port as well.   Plan OP surgery. Rx for Norco given.   Irene Limbo, MD [facility] [phone], pin [phone]

## 2016-10-18 ENCOUNTER — Encounter (HOSPITAL_BASED_OUTPATIENT_CLINIC_OR_DEPARTMENT_OTHER): Payer: Self-pay | Admitting: *Deleted

## 2016-10-25 ENCOUNTER — Ambulatory Visit (HOSPITAL_BASED_OUTPATIENT_CLINIC_OR_DEPARTMENT_OTHER): Payer: BLUE CROSS/BLUE SHIELD | Admitting: Anesthesiology

## 2016-10-25 ENCOUNTER — Encounter (HOSPITAL_BASED_OUTPATIENT_CLINIC_OR_DEPARTMENT_OTHER): Payer: Self-pay

## 2016-10-25 ENCOUNTER — Encounter (HOSPITAL_BASED_OUTPATIENT_CLINIC_OR_DEPARTMENT_OTHER)
Admission: RE | Disposition: A | Discharge status: Home / Self Care | Payer: Self-pay | Source: Ambulatory Visit | Attending: Plastic Surgery

## 2016-10-25 ENCOUNTER — Ambulatory Visit (HOSPITAL_BASED_OUTPATIENT_CLINIC_OR_DEPARTMENT_OTHER)
Admission: RE | Admit: 2016-10-25 | Discharge: 2016-10-25 | Disposition: A | Discharge status: Home / Self Care | Payer: BLUE CROSS/BLUE SHIELD | Source: Ambulatory Visit | Attending: Plastic Surgery | Admitting: Plastic Surgery

## 2016-10-25 DIAGNOSIS — K219 Gastro-esophageal reflux disease without esophagitis: Secondary | ICD-10-CM | POA: Diagnosis not present

## 2016-10-25 DIAGNOSIS — F419 Anxiety disorder, unspecified: Secondary | ICD-10-CM | POA: Diagnosis not present

## 2016-10-25 DIAGNOSIS — Z452 Encounter for adjustment and management of vascular access device: Secondary | ICD-10-CM | POA: Diagnosis not present

## 2016-10-25 DIAGNOSIS — Z87891 Personal history of nicotine dependence: Secondary | ICD-10-CM | POA: Diagnosis not present

## 2016-10-25 DIAGNOSIS — Z853 Personal history of malignant neoplasm of breast: Secondary | ICD-10-CM | POA: Diagnosis not present

## 2016-10-25 DIAGNOSIS — N651 Disproportion of reconstructed breast: Secondary | ICD-10-CM | POA: Insufficient documentation

## 2016-10-25 DIAGNOSIS — Z421 Encounter for breast reconstruction following mastectomy: Secondary | ICD-10-CM | POA: Diagnosis not present

## 2016-10-25 DIAGNOSIS — J45909 Unspecified asthma, uncomplicated: Secondary | ICD-10-CM | POA: Diagnosis not present

## 2016-10-25 DIAGNOSIS — Z923 Personal history of irradiation: Secondary | ICD-10-CM | POA: Insufficient documentation

## 2016-10-25 DIAGNOSIS — E039 Hypothyroidism, unspecified: Secondary | ICD-10-CM | POA: Insufficient documentation

## 2016-10-25 DIAGNOSIS — N6489 Other specified disorders of breast: Secondary | ICD-10-CM | POA: Diagnosis not present

## 2016-10-25 DIAGNOSIS — F418 Other specified anxiety disorders: Secondary | ICD-10-CM | POA: Diagnosis not present

## 2016-10-25 HISTORY — PX: PORT-A-CATH REMOVAL: SHX5289

## 2016-10-25 HISTORY — PX: MASTOPEXY: SHX5358

## 2016-10-25 HISTORY — DX: Depression, unspecified: F32.A

## 2016-10-25 HISTORY — DX: Major depressive disorder, single episode, unspecified: F32.9

## 2016-10-25 SURGERY — REMOVAL PORT-A-CATH
Anesthesia: Regional | Site: Chest | Laterality: Right

## 2016-10-25 MED ORDER — BUPIVACAINE-EPINEPHRINE (PF) 0.25% -1:200000 IJ SOLN
INTRAMUSCULAR | Status: AC
  Filled 2016-10-25: qty 30

## 2016-10-25 MED ORDER — LIDOCAINE HCL (CARDIAC) 20 MG/ML IV SOLN
INTRAVENOUS | Status: DC | PRN
  Administered 2016-10-25: 100 mg via INTRAVENOUS

## 2016-10-25 MED ORDER — SCOPOLAMINE 1 MG/3DAYS TD PT72: 1.0000 | MEDICATED_PATCH | Freq: Once | TRANSDERMAL | Status: DC | PRN

## 2016-10-25 MED ORDER — GABAPENTIN 300 MG PO CAPS
ORAL_CAPSULE | ORAL | Status: AC
  Filled 2016-10-25: qty 1

## 2016-10-25 MED ORDER — DEXAMETHASONE SODIUM PHOSPHATE 4 MG/ML IJ SOLN
INTRAMUSCULAR | Status: DC | PRN
  Administered 2016-10-25: 10 mg via INTRAVENOUS

## 2016-10-25 MED ORDER — CHLORHEXIDINE GLUCONATE CLOTH 2 % EX PADS: 6.0000 | MEDICATED_PAD | Freq: Once | CUTANEOUS | Status: DC

## 2016-10-25 MED ORDER — ACETAMINOPHEN 500 MG PO TABS
1000.0000 mg | ORAL_TABLET | ORAL | Status: AC
  Administered 2016-10-25: 1000 mg via ORAL

## 2016-10-25 MED ORDER — MIDAZOLAM HCL 2 MG/2ML IJ SOLN
INTRAMUSCULAR | Status: AC
  Filled 2016-10-25: qty 2

## 2016-10-25 MED ORDER — HYDROMORPHONE HCL 1 MG/ML IJ SOLN
INTRAMUSCULAR | Status: AC
  Filled 2016-10-25: qty 0.5

## 2016-10-25 MED ORDER — GABAPENTIN 300 MG PO CAPS
300.0000 mg | ORAL_CAPSULE | ORAL | Status: AC
  Administered 2016-10-25: 300 mg via ORAL

## 2016-10-25 MED ORDER — ACETAMINOPHEN 160 MG/5ML PO SOLN: 960.0000 mg | Freq: Once | ORAL | Status: DC

## 2016-10-25 MED ORDER — CELECOXIB 200 MG PO CAPS
200.0000 mg | ORAL_CAPSULE | ORAL | Status: AC
  Administered 2016-10-25: 200 mg via ORAL

## 2016-10-25 MED ORDER — ONDANSETRON HCL 4 MG/2ML IJ SOLN
INTRAMUSCULAR | Status: DC | PRN
  Administered 2016-10-25: 4 mg via INTRAVENOUS

## 2016-10-25 MED ORDER — CELECOXIB 200 MG PO CAPS
ORAL_CAPSULE | ORAL | Status: AC
  Filled 2016-10-25: qty 1

## 2016-10-25 MED ORDER — ROCURONIUM BROMIDE 100 MG/10ML IV SOLN
INTRAVENOUS | Status: DC | PRN
  Administered 2016-10-25: 50 mg via INTRAVENOUS

## 2016-10-25 MED ORDER — LACTATED RINGERS IV SOLN
INTRAVENOUS | Status: DC
  Administered 2016-10-25 (×2): via INTRAVENOUS

## 2016-10-25 MED ORDER — HYDROMORPHONE HCL 1 MG/ML IJ SOLN
0.2500 mg | INTRAMUSCULAR | Status: DC | PRN
  Administered 2016-10-25: 0.5 mg via INTRAVENOUS

## 2016-10-25 MED ORDER — SUGAMMADEX SODIUM 200 MG/2ML IV SOLN
INTRAVENOUS | Status: DC | PRN
  Administered 2016-10-25: 200 mg via INTRAVENOUS

## 2016-10-25 MED ORDER — SODIUM CHLORIDE 0.9 % IJ SOLN
INTRAMUSCULAR | Status: AC
  Filled 2016-10-25: qty 30

## 2016-10-25 MED ORDER — LIDOCAINE-EPINEPHRINE 1 %-1:100000 IJ SOLN
INTRAMUSCULAR | Status: AC
  Filled 2016-10-25: qty 1

## 2016-10-25 MED ORDER — ACETAMINOPHEN 500 MG PO TABS
ORAL_TABLET | ORAL | Status: AC
  Filled 2016-10-25: qty 2

## 2016-10-25 MED ORDER — MIDAZOLAM HCL 2 MG/2ML IJ SOLN
1.0000 mg | INTRAMUSCULAR | Status: DC | PRN
  Administered 2016-10-25: 2 mg via INTRAVENOUS

## 2016-10-25 MED ORDER — BUPIVACAINE LIPOSOME 1.3 % IJ SUSP
INTRAMUSCULAR | Status: DC | PRN
  Administered 2016-10-25: 40 mL

## 2016-10-25 MED ORDER — SUFENTANIL CITRATE 50 MCG/ML IV SOLN
INTRAVENOUS | Status: DC | PRN
  Administered 2016-10-25 (×4): 5 ug via INTRAVENOUS
  Administered 2016-10-25: 20 ug via INTRAVENOUS

## 2016-10-25 MED ORDER — FENTANYL CITRATE (PF) 100 MCG/2ML IJ SOLN: 50.0000 ug | INTRAMUSCULAR | Status: DC | PRN

## 2016-10-25 MED ORDER — SODIUM CHLORIDE 0.9 % IJ SOLN
INTRAMUSCULAR | Status: AC
  Filled 2016-10-25: qty 10

## 2016-10-25 MED ORDER — PROPOFOL 10 MG/ML IV BOLUS
INTRAVENOUS | Status: DC | PRN
Start: 2016-10-25 — End: 2016-10-25
  Administered 2016-10-25: 150 mg via INTRAVENOUS

## 2016-10-25 MED ORDER — CEFAZOLIN SODIUM-DEXTROSE 2-4 GM/100ML-% IV SOLN
INTRAVENOUS | Status: AC
  Filled 2016-10-25: qty 100

## 2016-10-25 MED ORDER — SUFENTANIL CITRATE 50 MCG/ML IV SOLN
INTRAVENOUS | Status: AC
  Filled 2016-10-25: qty 1

## 2016-10-25 MED ORDER — CEFAZOLIN SODIUM-DEXTROSE 2-4 GM/100ML-% IV SOLN
2.0000 g | INTRAVENOUS | Status: AC
  Administered 2016-10-25: 2 g via INTRAVENOUS

## 2016-10-25 MED ORDER — BUPIVACAINE-EPINEPHRINE 0.25% -1:200000 IJ SOLN
INTRAMUSCULAR | Status: AC
  Filled 2016-10-25: qty 1

## 2016-10-25 SURGICAL SUPPLY — 79 items
BAG DECANTER FOR FLEXI CONT (MISCELLANEOUS) IMPLANT
BENZOIN TINCTURE PRP APPL 2/3 (GAUZE/BANDAGES/DRESSINGS) IMPLANT
BINDER BREAST LRG (GAUZE/BANDAGES/DRESSINGS) IMPLANT
BINDER BREAST MEDIUM (GAUZE/BANDAGES/DRESSINGS) IMPLANT
BINDER BREAST XLRG (GAUZE/BANDAGES/DRESSINGS) ×4 IMPLANT
BINDER BREAST XXLRG (GAUZE/BANDAGES/DRESSINGS) IMPLANT
BLADE CLIPPER SURG (BLADE) IMPLANT
BLADE SURG 10 STRL SS (BLADE) ×12 IMPLANT
BLADE SURG 15 STRL LF DISP TIS (BLADE) IMPLANT
BLADE SURG 15 STRL SS (BLADE)
BNDG GAUZE ELAST 4 BULKY (GAUZE/BANDAGES/DRESSINGS) ×8 IMPLANT
CANISTER SUCT 1200ML W/VALVE (MISCELLANEOUS) ×4 IMPLANT
CHLORAPREP W/TINT 26ML (MISCELLANEOUS) ×8 IMPLANT
CLOSURE WOUND 1/2 X4 (GAUZE/BANDAGES/DRESSINGS)
COVER BACK TABLE 60X90IN (DRAPES) ×4 IMPLANT
COVER MAYO STAND STRL (DRAPES) ×4 IMPLANT
DECANTER SPIKE VIAL GLASS SM (MISCELLANEOUS) IMPLANT
DERMABOND ADVANCED (GAUZE/BANDAGES/DRESSINGS) ×8
DERMABOND ADVANCED .7 DNX12 (GAUZE/BANDAGES/DRESSINGS) ×8 IMPLANT
DRAIN CHANNEL 15F RND FF W/TCR (WOUND CARE) ×8 IMPLANT
DRAIN CHANNEL 19F RND (DRAIN) IMPLANT
DRAPE LAPAROTOMY 100X72 PEDS (DRAPES) IMPLANT
DRAPE TOP ARMCOVERS (MISCELLANEOUS) ×4 IMPLANT
DRAPE U-SHAPE 76X120 STRL (DRAPES) ×4 IMPLANT
DRAPE UTILITY XL STRL (DRAPES) IMPLANT
DRSG PAD ABDOMINAL 8X10 ST (GAUZE/BANDAGES/DRESSINGS) ×8 IMPLANT
DRSG TEGADERM 2-3/8X2-3/4 SM (GAUZE/BANDAGES/DRESSINGS) IMPLANT
ELECT BLADE 4.0 EZ CLEAN MEGAD (MISCELLANEOUS)
ELECT COATED BLADE 2.86 ST (ELECTRODE) ×4 IMPLANT
ELECT REM PT RETURN 9FT ADLT (ELECTROSURGICAL) ×4
ELECTRODE BLDE 4.0 EZ CLN MEGD (MISCELLANEOUS) IMPLANT
ELECTRODE REM PT RTRN 9FT ADLT (ELECTROSURGICAL) ×2 IMPLANT
EVACUATOR SILICONE 100CC (DRAIN) ×8 IMPLANT
GAUZE SPONGE 4X4 12PLY STRL (GAUZE/BANDAGES/DRESSINGS) ×4 IMPLANT
GAUZE SPONGE 4X4 12PLY STRL LF (GAUZE/BANDAGES/DRESSINGS) IMPLANT
GLOVE BIO SURGEON STRL SZ 6 (GLOVE) IMPLANT
GLOVE BIOGEL PI IND STRL 6.5 (GLOVE) ×2 IMPLANT
GLOVE BIOGEL PI IND STRL 8 (GLOVE) ×2 IMPLANT
GLOVE BIOGEL PI INDICATOR 6.5 (GLOVE) ×2
GLOVE BIOGEL PI INDICATOR 8 (GLOVE) ×2
GLOVE SURG SS PI 6.0 STRL IVOR (GLOVE) ×16 IMPLANT
GLOVE SURG SS PI 6.5 STRL IVOR (GLOVE) ×8 IMPLANT
GLOVE SURG SS PI 7.5 STRL IVOR (GLOVE) ×4 IMPLANT
GOWN STRL REUS W/ TWL LRG LVL3 (GOWN DISPOSABLE) ×4 IMPLANT
GOWN STRL REUS W/ TWL XL LVL3 (GOWN DISPOSABLE) ×2 IMPLANT
GOWN STRL REUS W/TWL LRG LVL3 (GOWN DISPOSABLE) ×4
GOWN STRL REUS W/TWL XL LVL3 (GOWN DISPOSABLE) ×2
MARKER SKIN DUAL TIP RULER LAB (MISCELLANEOUS) IMPLANT
NEEDLE HYPO 25X1 1.5 SAFETY (NEEDLE) ×4 IMPLANT
NEEDLE HYPO 30GX1 BEV (NEEDLE) IMPLANT
NEEDLE PRECISIONGLIDE 27X1.5 (NEEDLE) IMPLANT
NS IRRIG 1000ML POUR BTL (IV SOLUTION) ×4 IMPLANT
PACK BASIN DAY SURGERY FS (CUSTOM PROCEDURE TRAY) ×4 IMPLANT
PENCIL BUTTON HOLSTER BLD 10FT (ELECTRODE) ×4 IMPLANT
PIN SAFETY STERILE (MISCELLANEOUS) ×4 IMPLANT
SHEET MEDIUM DRAPE 40X70 STRL (DRAPES) ×4 IMPLANT
SLEEVE SCD COMPRESS KNEE MED (MISCELLANEOUS) ×4 IMPLANT
SPONGE GAUZE 2X2 8PLY STER LF (GAUZE/BANDAGES/DRESSINGS)
SPONGE GAUZE 2X2 8PLY STRL LF (GAUZE/BANDAGES/DRESSINGS) IMPLANT
SPONGE LAP 18X18 X RAY DECT (DISPOSABLE) ×16 IMPLANT
STAPLER VISISTAT 35W (STAPLE) ×8 IMPLANT
STRIP CLOSURE SKIN 1/2X4 (GAUZE/BANDAGES/DRESSINGS) IMPLANT
SUT ETHILON 2 0 FS 18 (SUTURE) ×8 IMPLANT
SUT MNCRL AB 4-0 PS2 18 (SUTURE) ×16 IMPLANT
SUT VIC AB 3-0 PS1 18 (SUTURE) ×12
SUT VIC AB 3-0 PS1 18XBRD (SUTURE) ×12 IMPLANT
SUT VIC AB 3-0 SH 27 (SUTURE)
SUT VIC AB 3-0 SH 27X BRD (SUTURE) IMPLANT
SUT VICRYL 4-0 PS2 18IN ABS (SUTURE) ×12 IMPLANT
SYR BULB 3OZ (MISCELLANEOUS) IMPLANT
SYR BULB IRRIGATION 50ML (SYRINGE) ×4 IMPLANT
SYR CONTROL 10ML LL (SYRINGE) ×4 IMPLANT
TAPE MEASURE VINYL STERILE (MISCELLANEOUS) ×4 IMPLANT
TOWEL OR 17X24 6PK STRL BLUE (TOWEL DISPOSABLE) ×8 IMPLANT
TRAY DSU PREP LF (CUSTOM PROCEDURE TRAY) IMPLANT
TUBE CONNECTING 20'X1/4 (TUBING) ×1
TUBE CONNECTING 20X1/4 (TUBING) ×3 IMPLANT
UNDERPAD 30X30 (UNDERPADS AND DIAPERS) ×8 IMPLANT
YANKAUER SUCT BULB TIP NO VENT (SUCTIONS) ×4 IMPLANT

## 2016-10-25 NOTE — Anesthesia Preprocedure Evaluation (Addendum)
Anesthesia Evaluation  Patient identified by MRN, date of birth, ID band Patient awake    Reviewed: Allergy & Precautions, H&P , NPO status , Patient's Chart, lab work & pertinent test results, reviewed documented beta blocker date and time   Airway Mallampati: II  TM Distance: >3 FB Neck ROM: Full    Dental no notable dental hx. (+) Teeth Intact, Dental Advisory Given   Pulmonary asthma , former smoker,    Pulmonary exam normal breath sounds clear to auscultation       Cardiovascular negative cardio ROS   Rhythm:Regular Rate:Normal     Neuro/Psych Anxiety negative neurological ROS     GI/Hepatic Neg liver ROS, GERD  Medicated and Controlled,  Endo/Other  Hypothyroidism   Renal/GU negative Renal ROS  negative genitourinary   Musculoskeletal   Abdominal   Peds  Hematology negative hematology ROS (+)   Anesthesia Other Findings   Reproductive/Obstetrics negative OB ROS                             Anesthesia Physical  Anesthesia Plan  ASA: II  Anesthesia Plan: General and Regional   Post-op Pain Management:    Induction: Intravenous  PONV Risk Score and Plan: 2 and Ondansetron, Dexamethasone and Treatment may vary due to age or medical condition  Airway Management Planned: Oral ETT  Additional Equipment:   Intra-op Plan:   Post-operative Plan: Extubation in OR  Informed Consent: I have reviewed the patients History and Physical, chart, labs and discussed the procedure including the risks, benefits and alternatives for the proposed anesthesia with the patient or authorized representative who has indicated his/her understanding and acceptance.   Dental advisory given  Plan Discussed with: CRNA  Anesthesia Plan Comments:        Anesthesia Quick Evaluation

## 2016-10-25 NOTE — Discharge Instructions (Signed)

## 2016-10-25 NOTE — Anesthesia Procedure Notes (Signed)
Procedure Name: Intubation Date/Time: 10/25/2016 12:27 PM Performed by: Melynda Ripple D Pre-anesthesia Checklist: Patient identified, Emergency Drugs available, Suction available and Patient being monitored Patient Re-evaluated:Patient Re-evaluated prior to induction Oxygen Delivery Method: Circle system utilized Preoxygenation: Pre-oxygenation with 100% oxygen Induction Type: IV induction Ventilation: Mask ventilation without difficulty Laryngoscope Size: Glidescope, Mac and 3 Grade View: Grade II Tube type: Oral Number of attempts: 1 Airway Equipment and Method: Stylet,  Oral airway and Video-laryngoscopy Placement Confirmation: ETT inserted through vocal cords under direct vision,  positive ETCO2 and breath sounds checked- equal and bilateral Secured at: 22 cm Tube secured with: Tape Dental Injury: Teeth and Oropharynx as per pre-operative assessment  Difficulty Due To: Difficulty was unanticipated, Difficult Airway- due to limited oral opening and Difficult Airway- due to dentition

## 2016-10-25 NOTE — Interval H&P Note (Signed)
History and Physical Interval Note:  10/25/2016 11:09 AM  Jenna [name]  has presented today for surgery, with the diagnosis of HISTORY OF LEFT BREAST CANCER  The various methods of treatment have been discussed with the patient and family. After consideration of risks, benefits and other options for treatment, the patient has consented to  Procedure(s): RIGHT CHEST PORT REMOVAL (Right) BILATERAL MASTOPEXY (Bilateral) as a surgical intervention .  The patient's history has been reviewed, patient examined, no change in status, stable for surgery.  I have reviewed the patient's chart and labs.  Questions were answered to the patient's satisfaction.     [name]

## 2016-10-25 NOTE — Transfer of Care (Signed)
Immediate Anesthesia Transfer of Care Note  Patient: Jenna [name]  Procedure(s) Performed: RIGHT CHEST PORT REMOVAL (Right Chest) BILATERAL MASTOPEXY (Bilateral Breast)  Patient Location: PACU  Anesthesia Type:General  Level of Consciousness: awake, alert  and oriented  Airway & Oxygen Therapy: Patient Spontanous Breathing and Patient connected to face mask oxygen  Post-op Assessment: Report given to RN and Post -op Vital signs reviewed and stable  Post vital signs: Reviewed and stable  Last Vitals:  Vitals:   10/25/16 1105  BP: 128/79  Pulse: 91  Resp: 18  Temp: 36.9 C  SpO2: 100%    Last Pain:  Vitals:   10/25/16 1105  TempSrc: Oral         Complications: No apparent anesthesia complications

## 2016-10-25 NOTE — Op Note (Signed)
Operative Note   DATE OF OPERATION: 10.22.18  LOCATION: [facility]-outpatient  SURGICAL DIVISION: Plastic Surgery  PREOPERATIVE DIAGNOSES:  1. History left breast cancer 2. Asymmetry post operative breasts 3. History therapeutic radiation  POSTOPERATIVE DIAGNOSES:  same  PROCEDURE:  1. Removal right chest port 2. Bilateral mastopexy  SURGEON: Irene Limbo MD MBA  ASSISTANT: none  ANESTHESIA:  General.   EBL: 45 ml  COMPLICATIONS: None immediate.   INDICATIONS FOR PROCEDURE:  The patient, Jenna [name], is a 56 y.o. female born on 1960/04/01, is here for bilateral mastopexy for treatment asymmetry noted following left partial mastectomy and adjuvant radiation.   FINDINGS: Left breast edematous consistent with radiation changes. Left reduction 319 Right reduction 389 g  DESCRIPTION OF PROCEDURE:  The patient was marked standing in the preoperative area to mark sternal notch, chest midline, anterior axillary lines, inframammary folds. The location of new nipple areolar complex was marked at level of on inframammary fold on anterior surface breast by palpation. This was marked symmetric over bilateral breasts. With aid of Wise pattern marker, location of new nipple areolar complex and vertical limbs (8cm) were marked. The patient was taken to the operating room. SCDs were placed and IV antibiotics were given. A time out was performed and all information was confirmed to be correct.   Incision made in right chest scar over port and carried through subcutaneous tissue and capsule. Port removed intact. Closure completed with 4-0 vicryl in superficial fascia and dermis followed by 4-0 monocryl subcuticular.  Over left breast, superomedial pedicle marked and nipple areolar complex marked with 45 mm diameter marker. Pedicle deepithlialized and developedto chest wall.  Lower pole breast tissue excised. Additional breast tissue from superior pole in location planned NAC inset  excised.Medial and lateral flaps developed. Breast tailor tacked closed.  I then directed attention to right breast where superomedial pedicle designed. The pedicle was deepithelialized and developed to 5-6 cm thickness and toward chest wall until tension free closure obtained. Lower polebreast tissue, lateral chest wall, and superior pole breast tissue excised. Breast tailor tacked closed, and patient brought to upright sitting position and assessed for symmetry. Patent returned to supine position and breast cavities irrigated and hemostasis obtained. Exparel infiltrated throughout each breast. 15 Fr JP placed in each breast and secured with 2-0 nylon. Closure completed with 3-0 vicryl to approximate dermis along inframammary fold and vertical limb. NAC inset with 4-0 vicryl in dermis. Skin closure completed with 4-0 monocryl subcuticular throughout.Tissue adhesive applied. Dry dressing and breast binder applied.  The patient was allowed to wake from anesthesia, extubated and taken to the recovery room in satisfactory condition.   SPECIMENS: Right and left breast reduction  DRAINS: 15 Fr JP in right and left breast  Irene Limbo, MD [facility] [phone], pin [phone]

## 2016-10-26 ENCOUNTER — Encounter (HOSPITAL_BASED_OUTPATIENT_CLINIC_OR_DEPARTMENT_OTHER): Payer: Self-pay | Admitting: Plastic Surgery

## 2016-10-27 NOTE — Anesthesia Postprocedure Evaluation (Signed)
Anesthesia Post Note  Patient: Jenna [name]  Procedure(s) Performed: RIGHT CHEST PORT REMOVAL (Right Chest) BILATERAL MASTOPEXY (Bilateral Breast)     Patient location during evaluation: PACU Anesthesia Type: Regional Level of consciousness: awake and alert Pain management: pain level controlled Vital Signs Assessment: post-procedure vital signs reviewed and stable Respiratory status: spontaneous breathing, nonlabored ventilation, respiratory function stable and patient connected to nasal cannula oxygen Cardiovascular status: blood pressure returned to baseline and stable Postop Assessment: no apparent nausea or vomiting Anesthetic complications: no    Last Vitals:  Vitals:   10/25/16 1630 10/25/16 1703  BP: 136/85 (!) 148/87  Pulse: (!) 112 (!) 114  Resp: (!) 21 18  Temp:  37.1 C  SpO2: 100% 98%    Last Pain:  Vitals:   10/25/16 1703  TempSrc: Oral  PainSc: [place]

## 2016-10-29 NOTE — Addendum Note (Signed)
Addendum  created 10/29/16 0540 by Lyndle Herrlich, MD   Anesthesia Review and Sign - Ready for Procedure, Sign clinical note

## 2016-11-01 ENCOUNTER — Other Ambulatory Visit: Payer: Self-pay | Admitting: Hematology and Oncology

## 2016-11-02 NOTE — Addendum Note (Signed)
Addendum  created 11/02/16 0808 by Lyndle Herrlich, MD   Sign clinical note

## 2016-11-12 ENCOUNTER — Other Ambulatory Visit: Payer: Self-pay

## 2016-11-12 MED ORDER — DULOXETINE HCL 60 MG PO CPEP: 60.0000 mg | ORAL_CAPSULE | Freq: Every day | ORAL | 3 refills | Status: DC

## 2016-11-16 ENCOUNTER — Other Ambulatory Visit: Payer: Self-pay

## 2016-11-16 MED ORDER — DULOXETINE HCL 60 MG PO CPEP: 60.0000 mg | ORAL_CAPSULE | Freq: Every day | ORAL | 3 refills | Status: DC

## 2017-01-11 ENCOUNTER — Encounter: Payer: Self-pay | Admitting: Hematology and Oncology

## 2017-01-11 DIAGNOSIS — C50512 Malignant neoplasm of lower-outer quadrant of left female breast: Secondary | ICD-10-CM

## 2017-01-12 ENCOUNTER — Telehealth: Payer: Self-pay

## 2017-01-12 NOTE — Telephone Encounter (Signed)
Called pt to return her mychart e-mail regarding her progressive R bone pain that she has been experiencing since starting on antihormonal therapy. Pt states that she had started on anastrozole soon after radiation treatment completed, and experienced multiple side effects and was switched to letrozole. Most of her symptoms resolved, but had been having bone pain on R forearm. Pt denies any recent injury or fall to the affected pain site. Pt has been taking OTC pain medication since it gets worse at night. Pt denies any other bone or muscle pain anywhere else in her body. Asked if pt had bone density test before and states that she had 1 in her early 38's. No issues with bone results.   Discussed with Lindsey,NP if we could order pt bone density since she has been on anti estrogen therapy for almost a year. Obtained order for bone density and will send order to [facility] per pt request. Will send message to scheduling to reschedule pt appt with Dr.Gudena for an earlier schedule to discuss results and to follow up for her yearly visit. No further needs at this time.

## 2017-01-14 ENCOUNTER — Encounter: Payer: Self-pay | Admitting: Adult Health

## 2017-01-14 DIAGNOSIS — Z780 Asymptomatic menopausal state: Secondary | ICD-10-CM | POA: Diagnosis not present

## 2017-01-14 DIAGNOSIS — M8589 Other specified disorders of bone density and structure, multiple sites: Secondary | ICD-10-CM | POA: Diagnosis not present

## 2017-01-24 ENCOUNTER — Other Ambulatory Visit: Payer: Self-pay | Admitting: Hematology and Oncology

## 2017-01-31 NOTE — Assessment & Plan Note (Signed)
Left lumpectomy 09/25/2015: IDC with DCIS, 1.2 cm, margins negative, 0/4 lymph nodes negative, grade 3, ER 70%, PR 5%, HER-2 positive ratio 3.45, Ki-67 10%, T1 CN 0 stage IA  Treatment plan: 1. Adjuvant chemotherapy with TCH 6 cycles 10/31/2015- 2/9/2018followed by Herceptin every 3 weeks for 1 year 2. Followed by adjuvant radiation 03/10/2016- 04/23/2016 3. Followed by adjuvant antiestrogen therapy ---------------------------------------------------------------------------------------------------------------------------------- Current treatment: Herceptin maintenance Herceptin toxicities: None I discussed the results of PERSEPHONE clinical trial which showed 6 months of Herceptin to be equivalent to one year. Based on this trial we decided to stop Herceptin maintenance up-to-date.  She is requesting plastic surgery referral so that she can have bilateral breasts surgery to make them look even. I sent a referral to Dr. Leland Johns   Severe hot flashes:  Effexor XR. Anastrozole toxicities: 1. Severe hot flashes 2. severe mood swings 3. Severe muscle cramps Because of this we elected to change treatment from anastrozole to letrozole. I sent a 30 day supply of letrozole. If she tolerates it well then we'll send her 90 day prescription.  Weight gain: Patient is very concerned about this. I discussed different ways she can lose weight.  I will see her back in 1 year

## 2017-02-01 ENCOUNTER — Ambulatory Visit (HOSPITAL_COMMUNITY)
Admission: RE | Admit: 2017-02-01 | Discharge: 2017-02-01 | Disposition: A | Discharge status: Home / Self Care | Payer: BLUE CROSS/BLUE SHIELD | Source: Ambulatory Visit | Attending: Hematology and Oncology | Admitting: Hematology and Oncology

## 2017-02-01 ENCOUNTER — Inpatient Hospital Stay: Payer: BLUE CROSS/BLUE SHIELD | Attending: Hematology and Oncology | Admitting: Hematology and Oncology

## 2017-02-01 ENCOUNTER — Telehealth: Payer: Self-pay | Admitting: Hematology and Oncology

## 2017-02-01 ENCOUNTER — Encounter (HOSPITAL_COMMUNITY): Payer: Self-pay

## 2017-02-01 VITALS — BP 151/82 | HR 96 | Temp 98.5°F | Resp 18 | Ht 62.0 in | Wt 168.3 lb

## 2017-02-01 DIAGNOSIS — Z79811 Long term (current) use of aromatase inhibitors: Secondary | ICD-10-CM | POA: Insufficient documentation

## 2017-02-01 DIAGNOSIS — C50512 Malignant neoplasm of lower-outer quadrant of left female breast: Secondary | ICD-10-CM | POA: Diagnosis not present

## 2017-02-01 DIAGNOSIS — M898X9 Other specified disorders of bone, unspecified site: Secondary | ICD-10-CM | POA: Diagnosis not present

## 2017-02-01 DIAGNOSIS — Z170 Estrogen receptor positive status [ER+]: Secondary | ICD-10-CM

## 2017-02-01 DIAGNOSIS — Z923 Personal history of irradiation: Secondary | ICD-10-CM

## 2017-02-01 DIAGNOSIS — M79631 Pain in right forearm: Secondary | ICD-10-CM | POA: Diagnosis not present

## 2017-02-01 NOTE — Telephone Encounter (Signed)
Gave patient AVs and calendar of upcoming February appointments.  °

## 2017-02-01 NOTE — Progress Notes (Signed)
Patient Care Team: Lavada Mesi as PCP - General (Family Medicine) Erroll Luna, MD as Consulting Physician (General Surgery) Nicholas Lose, MD as Consulting Physician (Hematology and Oncology) Gery Pray, MD as Consulting Physician (Radiation Oncology) Delice Bison Charlestine Massed, NP as Nurse Practitioner (Hematology and Oncology)  DIAGNOSIS:  Encounter Diagnosis  Name Primary?  . Malignant neoplasm of lower-outer quadrant of left breast of female, estrogen receptor positive ([name])     SUMMARY OF ONCOLOGIC HISTORY:   Breast cancer of lower-outer quadrant of left female breast ([name])   09/10/2015 Initial Diagnosis    Screening detected left breast asymmetry: 2 adjacent masses 4:00: 8 mm and 3:30: 9 mm; axilla negative, biopsy grade 2-3 IDC, ER 70%, PR 5%, Ki-67 10%, HER-2 positive ratio 3.41, T1BN0 stage IA      09/25/2015 Surgery    Left lumpectomy: IDC with DCIS, 1.2 cm, margins negative, 0/4 lymph nodes negative, grade 3, ER 70%, PR 5%, HER-2 positive ratio 3.45, Ki-67 10%, T1 CN 0 stage IA      10/31/2015 - 02/13/2016 Chemotherapy    Adjuvant chemotherapy with TCH 6 cycles followed by Herceptin maintenance for 1 year      03/10/2016 - 04/23/2016 Radiation Therapy    Adjuvant radiation therapy      05/28/2016 -  Anti-estrogen oral therapy    Anastrozole 1 mg daily, changed to letrozole after 2 months due to poor tolerance       CHIEF COMPLIANT: Follow-up on letrozole therapy, complaining of severe right forearm pain, pain behind the left ribs  INTERVAL HISTORY: Jenna [name] is a 57 year old with above-mentioned history of left breast cancer treated with lumpectomy followed by adjuvant chemotherapy and radiation and is currently on oral antiestrogen therapy.  She is tolerating letrozole much better than anastrozole.  She is still however complains of profound heat sensation throughout the day.  But her biggest complaint is related to right arm pain which has been  going on for a couple of months.  The pain is presently respective of what she does.  It gets aggravated by motion.  She takes pain medication but they do not seem to be helping her significantly.  REVIEW OF SYSTEMS:   Constitutional: Denies fevers, chills or abnormal weight loss Eyes: Denies blurriness of vision Ears, nose, mouth, throat, and face: Denies mucositis or sore throat Respiratory: Denies cough, dyspnea or wheezes Cardiovascular: Denies palpitation, chest discomfort Gastrointestinal:  Denies nausea, heartburn or change in bowel habits Skin: Denies abnormal skin rashes Lymphatics: Denies new lymphadenopathy or easy bruising Neurological:Denies numbness, tingling or new weaknesses Behavioral/Psych: Mood is stable, no new changes  Extremities: Right arm pain and tenderness Breast:  denies any pain or lumps or nodules in either breasts All other systems were reviewed with the patient and are negative.  I have reviewed the past medical history, past surgical history, social history and family history with the patient and they are unchanged from previous note.  ALLERGIES:  is allergic to latex and sulfa antibiotics.  MEDICATIONS:  Current Outpatient Medications  Medication Sig Dispense Refill  . Albuterol Sulfate (PROAIR RESPICLICK) 892 (90 BASE) MCG/ACT AEPB Inhale 2 puffs into the lungs every 6 (six) hours. 3 each 3  . Calcium Carbonate-Vit D-Min (CALCIUM 1200 PO) Take by mouth.    . Cholecalciferol (VITAMIN D3) 1000 units CAPS Take by mouth.    . DULoxetine (CYMBALTA) 60 MG capsule Take 1 capsule (60 mg total) daily by mouth. 90 capsule 3  . letrozole (FEMARA) 2.5  MG tablet Take 1 tablet (2.5 mg total) by mouth daily. 30 tablet 0  . letrozole (FEMARA) 2.5 MG tablet TAKE 1 TABLET BY MOUTH EVERY DAY 30 tablet 0  . liothyronine (CYTOMEL) 50 MCG tablet Take 1 tablet (50 mcg total) by mouth daily. 90 tablet 0  . omeprazole (PRILOSEC) 20 MG capsule Take 1 capsule (20 mg total) by  mouth 2 (two) times daily before a meal. 180 capsule 4  . polyethylene glycol powder (MIRALAX) powder Take 1 Container by mouth once.    . simethicone (GAS-X) 80 MG chewable tablet Chew 80 mg by mouth every 6 (six) hours as needed for flatulence.    . Triamcinolone Acetonide (NASACORT ALLERGY 24HR NA) Place into the nose.     No current facility-administered medications for this visit.     PHYSICAL EXAMINATION: ECOG PERFORMANCE STATUS: 1 - Symptomatic but completely ambulatory  Vitals:   02/01/17 1057  BP: (!) 151/82  Pulse: 96  Resp: 18  Temp: 98.5 F (36.9 C)  SpO2: 99%   Filed Weights   02/01/17 1057  Weight: 168 lb 4.8 oz (76.3 kg)    GENERAL:alert, no distress and comfortable SKIN: skin color, texture, turgor are normal, no rashes or significant lesions EYES: normal, Conjunctiva are pink and non-injected, sclera clear OROPHARYNX:no exudate, no erythema and lips, buccal mucosa, and tongue normal  NECK: supple, thyroid normal size, non-tender, without nodularity LYMPH:  no palpable lymphadenopathy in the cervical, axillary or inguinal LUNGS: clear to auscultation and percussion with normal breathing effort HEART: regular rate & rhythm and no murmurs and no lower extremity edema ABDOMEN:abdomen soft, non-tender and normal bowel sounds MUSCULOSKELETAL:no cyanosis of digits and no clubbing  NEURO: alert & oriented x 3 with fluent speech, no focal motor/sensory deficits EXTREMITIES: Tenderness of the right arm with any motion including abduction abduction flexion and extension. BREAST: No palpable masses or nodules in either right or left breasts. No palpable axillary supraclavicular or infraclavicular adenopathy no breast tenderness or nipple discharge. (exam performed in the presence of a chaperone)  LABORATORY DATA:  I have reviewed the data as listed CMP Latest Ref Rng & Units 07/30/2016 07/16/2016 05/28/2016  Glucose 70 - 140 mg/dl 93 103(H) 93  BUN 7.0 - 26.0 mg/dL 15.8  20 14.6  Creatinine 0.6 - 1.1 mg/dL 0.7 0.92 0.8  Sodium 136 - 145 mEq/L 140 140 142  Potassium 3.5 - 5.1 mEq/L 4.4 5.8(H) 5.3 No visable hemolysis(H)  Chloride 98 - 110 mmol/L - 100 -  CO2 22 - 29 mEq/L '26 29 29  '$ Calcium 8.4 - 10.4 mg/dL 9.4 9.8 9.9  Total Protein 6.4 - 8.3 g/dL 7.2 7.1 7.5  Total Bilirubin 0.20 - 1.20 mg/dL 0.51 0.6 0.53  Alkaline Phos 40 - 150 U/L 85 75 85  AST 5 - 34 U/L '26 26 18  '$ ALT 0 - 55 U/L '25 24 15    '$ Lab Results  Component Value Date   WBC 5.9 07/30/2016   HGB 12.7 07/30/2016   HCT 38.7 07/30/2016   MCV 96.8 07/30/2016   PLT 214 07/30/2016   NEUTROABS 4.0 07/30/2016    ASSESSMENT & PLAN:  Breast cancer of lower-outer quadrant of left female breast ([place]) Left lumpectomy 09/25/2015: IDC with DCIS, 1.2 cm, margins negative, 0/4 lymph nodes negative, grade 3, ER 70%, PR 5%, HER-2 positive ratio 3.45, Ki-67 10%, T1 CN 0 stage IA  Treatment plan: 1. Adjuvant chemotherapy with TCH 6 cycles 10/31/2015- 2/9/2018followed by Herceptin every 3 weeks  for 1 year completed 07/30/2016 2. Followed by adjuvant radiation 03/10/2016- 04/23/2016 3. Followed by adjuvant antiestrogen therapy started 05/28/2016 ---------------------------------------------------------------------------------------------------------------------------------- Letrozole toxicities: 1. Severe heat sensation  2. Tendinitis Patient is experiencing severe pain in the right forearm.  Because of this I would like to get an x-ray today.  Would also like to obtain CT chest abdomen pelvis as well as bone scan for evaluation of her symptoms. Return to clinic after the scans are performed next week.  I spent 25 minutes talking to the patient of which more than half was spent in counseling and coordination of care.  No orders of the defined types were placed in this encounter.  The patient has a good understanding of the overall plan. she agrees with it. she will call with any problems that may  develop before the next visit here.   Harriette Ohara, MD 02/01/17

## 2017-02-07 ENCOUNTER — Ambulatory Visit (HOSPITAL_COMMUNITY)
Admission: RE | Admit: 2017-02-07 | Discharge: 2017-02-07 | Disposition: A | Discharge status: Home / Self Care | Payer: BLUE CROSS/BLUE SHIELD | Source: Ambulatory Visit | Attending: Hematology and Oncology | Admitting: Hematology and Oncology

## 2017-02-07 ENCOUNTER — Encounter (HOSPITAL_COMMUNITY)
Admission: RE | Admit: 2017-02-07 | Discharge: 2017-02-07 | Disposition: A | Discharge status: Home / Self Care | Payer: BLUE CROSS/BLUE SHIELD | Source: Ambulatory Visit | Attending: Hematology and Oncology | Admitting: Hematology and Oncology

## 2017-02-07 DIAGNOSIS — M898X9 Other specified disorders of bone, unspecified site: Secondary | ICD-10-CM | POA: Insufficient documentation

## 2017-02-07 DIAGNOSIS — M79631 Pain in right forearm: Secondary | ICD-10-CM | POA: Diagnosis not present

## 2017-02-07 DIAGNOSIS — Z170 Estrogen receptor positive status [ER+]: Secondary | ICD-10-CM | POA: Diagnosis not present

## 2017-02-07 DIAGNOSIS — I700 Atherosclerosis of aorta: Secondary | ICD-10-CM | POA: Insufficient documentation

## 2017-02-07 DIAGNOSIS — C50912 Malignant neoplasm of unspecified site of left female breast: Secondary | ICD-10-CM | POA: Diagnosis not present

## 2017-02-07 DIAGNOSIS — R911 Solitary pulmonary nodule: Secondary | ICD-10-CM | POA: Insufficient documentation

## 2017-02-07 DIAGNOSIS — C50512 Malignant neoplasm of lower-outer quadrant of left female breast: Secondary | ICD-10-CM

## 2017-02-07 MED ORDER — IOPAMIDOL (ISOVUE-300) INJECTION 61%
INTRAVENOUS | Status: AC
  Filled 2017-02-07: qty 100

## 2017-02-07 MED ORDER — TECHNETIUM TC 99M MEDRONATE IV KIT
25.0000 | PACK | Freq: Once | INTRAVENOUS | Status: AC | PRN
  Administered 2017-02-07: 25 via INTRAVENOUS

## 2017-02-07 MED ORDER — IOPAMIDOL (ISOVUE-300) INJECTION 61%
100.0000 mL | Freq: Once | INTRAVENOUS | Status: AC | PRN
  Administered 2017-02-07: 100 mL via INTRAVENOUS

## 2017-02-08 ENCOUNTER — Inpatient Hospital Stay: Payer: BLUE CROSS/BLUE SHIELD | Attending: Hematology and Oncology | Admitting: Hematology and Oncology

## 2017-02-08 ENCOUNTER — Telehealth: Payer: Self-pay | Admitting: Hematology and Oncology

## 2017-02-08 DIAGNOSIS — Z923 Personal history of irradiation: Secondary | ICD-10-CM | POA: Insufficient documentation

## 2017-02-08 DIAGNOSIS — Z170 Estrogen receptor positive status [ER+]: Secondary | ICD-10-CM | POA: Insufficient documentation

## 2017-02-08 DIAGNOSIS — C50512 Malignant neoplasm of lower-outer quadrant of left female breast: Secondary | ICD-10-CM | POA: Diagnosis not present

## 2017-02-08 DIAGNOSIS — Z79811 Long term (current) use of aromatase inhibitors: Secondary | ICD-10-CM | POA: Diagnosis not present

## 2017-02-08 MED ORDER — ANASTROZOLE 1 MG PO TABS: 1.0000 mg | ORAL_TABLET | Freq: Every day | ORAL | 3 refills | Status: DC

## 2017-02-08 NOTE — Assessment & Plan Note (Signed)
Left lumpectomy 09/25/2015: IDC with DCIS, 1.2 cm, margins negative, 0/4 lymph nodes negative, grade 3, ER 70%, PR 5%, HER-2 positive ratio 3.45, Ki-67 10%, T1 CN 0 stage IA  Treatment plan: 1. Adjuvant chemotherapy with TCH 6 cycles 10/31/2015- 2/9/2018followed by Herceptin every 3 weeks for 1 year completed 07/30/2016 2. Followed by adjuvant radiation 03/10/2016- 04/23/2016 3. Followed by adjuvant antiestrogen therapy started 05/28/2016 ---------------------------------------------------------------------------------------------------------------------------------- Letrozole toxicities: 1. Severe heat sensation  2. Tendinitis Patient is experiencing severe pain in the right forearm.    Right forearm x-ray 02/01/2017: Negative CT CAP 02/07/2017: No evidence of metastatic disease, 4 mm subpleural left lower lobe nodule nonspecific Bone scan 02/07/2017: No findings of metastatic disease  Based on the radiology findings, suspect the tendinitis is related to the musculoskeletal adverse effects of letrozole therapy.  I discussed with her about switching her from letrozole to anastrozole.  She is willing to try this. Return to clinic in 3 months for toxicity check and follow-up

## 2017-02-08 NOTE — Progress Notes (Signed)
Patient Care Team: Lavada Mesi as PCP - General (Family Medicine) Erroll Luna, MD as Consulting Physician (General Surgery) Nicholas Lose, MD as Consulting Physician (Hematology and Oncology) Gery Pray, MD as Consulting Physician (Radiation Oncology) Delice Bison Charlestine Massed, NP as Nurse Practitioner (Hematology and Oncology)  DIAGNOSIS:  Encounter Diagnosis  Name Primary?  . Malignant neoplasm of lower-outer quadrant of left breast of female, estrogen receptor positive ([place])     SUMMARY OF ONCOLOGIC HISTORY:   Breast cancer of lower-outer quadrant of left female breast ([place])   09/10/2015 Initial Diagnosis    Screening detected left breast asymmetry: 2 adjacent masses 4:00: 8 mm and 3:30: 9 mm; axilla negative, biopsy grade 2-3 IDC, ER 70%, PR 5%, Ki-67 10%, HER-2 positive ratio 3.41, T1BN0 stage IA      09/25/2015 Surgery    Left lumpectomy: IDC with DCIS, 1.2 cm, margins negative, 0/4 lymph nodes negative, grade 3, ER 70%, PR 5%, HER-2 positive ratio 3.45, Ki-67 10%, T1 CN 0 stage IA      10/31/2015 - 02/13/2016 Chemotherapy    Adjuvant chemotherapy with TCH 6 cycles followed by Herceptin maintenance for 1 year      03/10/2016 - 04/23/2016 Radiation Therapy    Adjuvant radiation therapy      05/28/2016 -  Anti-estrogen oral therapy    Anastrozole 1 mg daily, changed to letrozole after 2 months due to poor tolerance, switched to anastrozole due to right forearm pain from tendinitis       CHIEF COMPLIANT: Follow-up to review CT scans and bone scans  INTERVAL HISTORY: Natale Barba is a 57 year old with above-mentioned history of left breast cancer treated with lumpectomy adjuvant chemotherapy and radiation who has been on oral antiestrogen therapy with letrozole and started developing intense pain in the right forearm for which she obtained x-ray bone scan CT scan and is here today to discuss results.  The bone scan and CT scan did not show any evidence of  metastatic disease.  She is so ecstatic to hear this result.  REVIEW OF SYSTEMS:   Constitutional: Denies fevers, chills or abnormal weight loss Eyes: Denies blurriness of vision Ears, nose, mouth, throat, and face: Denies mucositis or sore throat Respiratory: Denies cough, dyspnea or wheezes Cardiovascular: Denies palpitation, chest discomfort Gastrointestinal:  Denies nausea, heartburn or change in bowel habits Skin: Denies abnormal skin rashes Lymphatics: Denies new lymphadenopathy or easy bruising Neurological:Denies numbness, tingling or new weaknesses Behavioral/Psych: Mood is stable, no new changes  Extremities: No lower extremity edema, right forearm pain  All other systems were reviewed with the patient and are negative.  I have reviewed the past medical history, past surgical history, social history and family history with the patient and they are unchanged from previous note.  ALLERGIES:  is allergic to latex and sulfa antibiotics.  MEDICATIONS:  Current Outpatient Medications  Medication Sig Dispense Refill  . Albuterol Sulfate (PROAIR RESPICLICK) 935 (90 BASE) MCG/ACT AEPB Inhale 2 puffs into the lungs every 6 (six) hours. 3 each 3  . anastrozole (ARIMIDEX) 1 MG tablet Take 1 tablet (1 mg total) by mouth daily. 90 tablet 3  . Calcium Carbonate-Vit D-Min (CALCIUM 1200 PO) Take by mouth.    . Cholecalciferol (VITAMIN D3) 1000 units CAPS Take by mouth.    . DULoxetine (CYMBALTA) 60 MG capsule Take 1 capsule (60 mg total) daily by mouth. 90 capsule 3  . liothyronine (CYTOMEL) 50 MCG tablet Take 1 tablet (50 mcg total) by mouth daily. [place]  tablet 0  . omeprazole (PRILOSEC) 20 MG capsule Take 1 capsule (20 mg total) by mouth 2 (two) times daily before a meal. 180 capsule 4  . polyethylene glycol powder (MIRALAX) powder Take 1 Container by mouth once.    . simethicone (GAS-X) 80 MG chewable tablet Chew 80 mg by mouth every 6 (six) hours as needed for flatulence.    .  Triamcinolone Acetonide (NASACORT ALLERGY 24HR NA) Place into the nose.     No current facility-administered medications for this visit.     PHYSICAL EXAMINATION: ECOG PERFORMANCE STATUS: 1 - Symptomatic but completely ambulatory  Vitals:   02/08/17 1116  BP: (!) 146/87  Pulse: 88  Resp: 18  Temp: 98.4 F (36.9 C)  SpO2: 98%   Filed Weights   02/08/17 1116  Weight: 170 lb 9.6 oz (77.4 kg)    GENERAL:alert, no distress and comfortable SKIN: skin color, texture, turgor are normal, no rashes or significant lesions EYES: normal, Conjunctiva are pink and non-injected, sclera clear OROPHARYNX:no exudate, no erythema and lips, buccal mucosa, and tongue normal  NECK: supple, thyroid normal size, non-tender, without nodularity LYMPH:  no palpable lymphadenopathy in the cervical, axillary or inguinal LUNGS: clear to auscultation and percussion with normal breathing effort HEART: regular rate & rhythm and no murmurs and no lower extremity edema ABDOMEN:abdomen soft, non-tender and normal bowel sounds MUSCULOSKELETAL:no cyanosis of digits and no clubbing  NEURO: alert & oriented x 3 with fluent speech, no focal motor/sensory deficits EXTREMITIES: No lower extremity edema  LABORATORY DATA:  I have reviewed the data as listed CMP Latest Ref Rng & Units 07/30/2016 07/16/2016 05/28/2016  Glucose 70 - 140 mg/dl 93 103(H) 93  BUN 7.0 - 26.0 mg/dL 15.8 20 14.6  Creatinine 0.6 - 1.1 mg/dL 0.7 0.92 0.8  Sodium 136 - 145 mEq/L 140 140 142  Potassium 3.5 - 5.1 mEq/L 4.4 5.8(H) 5.3 No visable hemolysis(H)  Chloride 98 - 110 mmol/L - 100 -  CO2 22 - 29 mEq/L '26 29 29  '$ Calcium 8.4 - 10.4 mg/dL 9.4 9.8 9.9  Total Protein 6.4 - 8.3 g/dL 7.2 7.1 7.5  Total Bilirubin 0.20 - 1.20 mg/dL 0.51 0.6 0.53  Alkaline Phos 40 - 150 U/L 85 75 85  AST 5 - 34 U/L '26 26 18  '$ ALT 0 - 55 U/L '25 24 15    '$ Lab Results  Component Value Date   WBC 5.9 07/30/2016   HGB 12.7 07/30/2016   HCT 38.7 07/30/2016   MCV  96.8 07/30/2016   PLT 214 07/30/2016   NEUTROABS 4.0 07/30/2016    ASSESSMENT & PLAN:  Breast cancer of lower-outer quadrant of left female breast ([place]) Left lumpectomy 09/25/2015: IDC with DCIS, 1.2 cm, margins negative, 0/4 lymph nodes negative, grade 3, ER 70%, PR 5%, HER-2 positive ratio 3.45, Ki-67 10%, T1 CN 0 stage IA  Treatment plan: 1. Adjuvant chemotherapy with TCH 6 cycles 10/31/2015- 2/9/2018followed by Herceptin every 3 weeks for 1 year completed 07/30/2016 2. Followed by adjuvant radiation 03/10/2016- 04/23/2016 3. Followed by adjuvant antiestrogen therapy started 05/28/2016 ---------------------------------------------------------------------------------------------------------------------------------- Letrozole toxicities: 1. Severe heat sensation  2. Tendinitis Patient is experiencing severe pain in the right forearm.    Right forearm x-ray 02/01/2017: Negative CT CAP 02/07/2017: No evidence of metastatic disease, 4 mm subpleural left lower lobe nodule nonspecific Bone scan 02/07/2017: No findings of metastatic disease  Based on the radiology findings, suspect the tendinitis is related to the musculoskeletal adverse effects of letrozole therapy.  I discussed with her about switching her from letrozole to anastrozole.  She is willing to try this. Return to clinic in 3 months for toxicity check and follow-up  I spent 25 minutes talking to the patient of which more than half was spent in counseling and coordination of care.  No orders of the defined types were placed in this encounter.  The patient has a good understanding of the overall plan. she agrees with it. she will call with any problems that may develop before the next visit here.   Harriette Ohara, MD 02/08/17

## 2017-02-08 NOTE — Telephone Encounter (Signed)
Gave patient AVs and calendar of upcoming May appointments.  °

## 2017-02-21 ENCOUNTER — Other Ambulatory Visit: Payer: Self-pay | Admitting: Hematology and Oncology

## 2017-03-01 ENCOUNTER — Other Ambulatory Visit: Payer: Self-pay

## 2017-05-10 ENCOUNTER — Telehealth: Payer: Self-pay | Admitting: Hematology and Oncology

## 2017-05-10 ENCOUNTER — Inpatient Hospital Stay: Payer: BLUE CROSS/BLUE SHIELD | Attending: Hematology and Oncology | Admitting: Hematology and Oncology

## 2017-05-10 DIAGNOSIS — C50512 Malignant neoplasm of lower-outer quadrant of left female breast: Secondary | ICD-10-CM | POA: Diagnosis not present

## 2017-05-10 DIAGNOSIS — Z170 Estrogen receptor positive status [ER+]: Secondary | ICD-10-CM | POA: Diagnosis not present

## 2017-05-10 DIAGNOSIS — Z79811 Long term (current) use of aromatase inhibitors: Secondary | ICD-10-CM | POA: Insufficient documentation

## 2017-05-10 MED ORDER — ANASTROZOLE 1 MG PO TABS: 1.0000 mg | ORAL_TABLET | Freq: Every day | ORAL | 3 refills | Status: DC

## 2017-05-10 NOTE — Assessment & Plan Note (Signed)
Left lumpectomy 09/25/2015: IDC with DCIS, 1.2 cm, margins negative, 0/4 lymph nodes negative, grade 3, ER 70%, PR 5%, HER-2 positive ratio 3.45, Ki-67 10%, T1 CN 0 stage IA  Treatment plan: 1. Adjuvant chemotherapy with TCH 6 cycles 10/31/2015- 2/9/2018followed by Herceptin every 3 weeks for 1 yearcompleted 07/30/2016 2. Followed by adjuvant radiation 03/10/2016- 04/23/2016 3. Followed by adjuvant antiestrogen therapystarted 05/28/2016 ---------------------------------------------------------------------------------------------------------------------------------- Letrozole toxicities: Severe heat sensation, Tendinitis Patient is experiencing severe pain in the right forearm.  Because of this we switched her to anastrozole  Anastrozole toxicities :  Right forearm x-ray 02/01/2017: Negative CT CAP 02/07/2017: No evidence of metastatic disease, 4 mm subpleural left lower lobe nodule nonspecific Bone scan 02/07/2017: No findings of metastatic disease  Return to clinic in 6 months for follow-up

## 2017-05-10 NOTE — Progress Notes (Signed)
Patient Care Team: Lavada Mesi as PCP - General (Family Medicine) Erroll Luna, MD as Consulting Physician (General Surgery) Nicholas Lose, MD as Consulting Physician (Hematology and Oncology) Gery Pray, MD as Consulting Physician (Radiation Oncology) Delice Bison Charlestine Massed, NP as Nurse Practitioner (Hematology and Oncology)  DIAGNOSIS:  Encounter Diagnosis  Name Primary?  . Malignant neoplasm of lower-outer quadrant of left breast of female, estrogen receptor positive ([place])     SUMMARY OF ONCOLOGIC HISTORY:   Breast cancer of lower-outer quadrant of left female breast ([place])   09/10/2015 Initial Diagnosis    Screening detected left breast asymmetry: 2 adjacent masses 4:00: 8 mm and 3:30: 9 mm; axilla negative, biopsy grade 2-3 IDC, ER 70%, PR 5%, Ki-67 10%, HER-2 positive ratio 3.41, T1BN0 stage IA      09/25/2015 Surgery    Left lumpectomy: IDC with DCIS, 1.2 cm, margins negative, 0/4 lymph nodes negative, grade 3, ER 70%, PR 5%, HER-2 positive ratio 3.45, Ki-67 10%, T1 CN 0 stage IA      10/31/2015 - 02/13/2016 Chemotherapy    Adjuvant chemotherapy with TCH 6 cycles followed by Herceptin maintenance for 1 year      03/10/2016 - 04/23/2016 Radiation Therapy    Adjuvant radiation therapy      05/28/2016 -  Anti-estrogen oral therapy    Anastrozole 1 mg daily, changed to letrozole after 2 months due to poor tolerance, switched to anastrozole due to right forearm pain from tendinitis       CHIEF COMPLIANT: Follow-up on anastrozole therapy  INTERVAL HISTORY: Jenna [name] is a 57 year old with above-mentioned history of left breast cancer treated with lumpectomy radiation adjuvant chemotherapy and is currently on antiestrogen therapy.  Switch her from anastrozole to letrozole and we are switching back to anastrozole.  This is because of tendinitis.  She has been tolerating anastrozole much better.  Denies any lumps or nodules in the breast. She has been exercising  by walking 5 miles every day.  This is enabled her to tolerate antiestrogen therapy much better. She is also in better frame of mind.  She has been maintaining her weight.  REVIEW OF SYSTEMS:   Constitutional: Denies fevers, chills or abnormal weight loss Eyes: Denies blurriness of vision Ears, nose, mouth, throat, and face: Denies mucositis or sore throat Respiratory: Denies cough, dyspnea or wheezes Cardiovascular: Denies palpitation, chest discomfort Gastrointestinal:  Denies nausea, heartburn or change in bowel habits Skin: Denies abnormal skin rashes Lymphatics: Denies new lymphadenopathy or easy bruising Neurological:Denies numbness, tingling or new weaknesses Behavioral/Psych: Mood is stable, no new changes  Extremities: No lower extremity edema Breast:  denies any pain or lumps or nodules in either breasts All other systems were reviewed with the patient and are negative.  I have reviewed the past medical history, past surgical history, social history and family history with the patient and they are unchanged from previous note.  ALLERGIES:  is allergic to latex and sulfa antibiotics.  MEDICATIONS:  Current Outpatient Medications  Medication Sig Dispense Refill  . Albuterol Sulfate (PROAIR RESPICLICK) 242 (90 BASE) MCG/ACT AEPB Inhale 2 puffs into the lungs every 6 (six) hours. 3 each 3  . anastrozole (ARIMIDEX) 1 MG tablet Take 1 tablet (1 mg total) by mouth daily. 90 tablet 3  . Calcium Carbonate-Vit D-Min (CALCIUM 1200 PO) Take by mouth.    . Cholecalciferol (VITAMIN D3) 1000 units CAPS Take by mouth.    . DULoxetine (CYMBALTA) 60 MG capsule Take 1 capsule (60 mg  total) daily by mouth. 90 capsule 3  . liothyronine (CYTOMEL) 50 MCG tablet Take 1 tablet (50 mcg total) by mouth daily. 90 tablet 0  . omeprazole (PRILOSEC) 20 MG capsule Take 1 capsule (20 mg total) by mouth 2 (two) times daily before a meal. 180 capsule 4  . polyethylene glycol powder (MIRALAX) powder Take 1  Container by mouth once.    . simethicone (GAS-X) 80 MG chewable tablet Chew 80 mg by mouth every 6 (six) hours as needed for flatulence.    . Triamcinolone Acetonide (NASACORT ALLERGY 24HR NA) Place into the nose.     No current facility-administered medications for this visit.     PHYSICAL EXAMINATION: ECOG PERFORMANCE STATUS: 1 - Symptomatic but completely ambulatory  Vitals:   05/10/17 1025  BP: (!) 156/86  Pulse: 90  Resp: 17  Temp: 98.5 F (36.9 C)  SpO2: 100%   Filed Weights   05/10/17 1025  Weight: 174 lb 1.6 oz (79 kg)    GENERAL:alert, no distress and comfortable SKIN: skin color, texture, turgor are normal, no rashes or significant lesions EYES: normal, Conjunctiva are pink and non-injected, sclera clear OROPHARYNX:no exudate, no erythema and lips, buccal mucosa, and tongue normal  NECK: supple, thyroid normal size, non-tender, without nodularity LYMPH:  no palpable lymphadenopathy in the cervical, axillary or inguinal LUNGS: clear to auscultation and percussion with normal breathing effort HEART: regular rate & rhythm and no murmurs and no lower extremity edema ABDOMEN:abdomen soft, non-tender and normal bowel sounds MUSCULOSKELETAL:no cyanosis of digits and no clubbing  NEURO: alert & oriented x 3 with fluent speech, no focal motor/sensory deficits EXTREMITIES: No lower extremity edema  LABORATORY DATA:  I have reviewed the data as listed CMP Latest Ref Rng & Units 07/30/2016 07/16/2016 05/28/2016  Glucose 70 - 140 mg/dl 93 103(H) 93  BUN 7.0 - 26.0 mg/dL 15.8 20 14.6  Creatinine 0.6 - 1.1 mg/dL 0.7 0.92 0.8  Sodium 136 - 145 mEq/L 140 140 142  Potassium 3.5 - 5.1 mEq/L 4.4 5.8(H) 5.3 No visable hemolysis(H)  Chloride 98 - 110 mmol/L - 100 -  CO2 22 - 29 mEq/L '26 29 29  '$ Calcium 8.4 - 10.4 mg/dL 9.4 9.8 9.9  Total Protein 6.4 - 8.3 g/dL 7.2 7.1 7.5  Total Bilirubin 0.20 - 1.20 mg/dL 0.51 0.6 0.53  Alkaline Phos 40 - 150 U/L 85 75 85  AST 5 - 34 U/L '26 26 18   '$ ALT 0 - 55 U/L '25 24 15    '$ Lab Results  Component Value Date   WBC 5.9 07/30/2016   HGB 12.7 07/30/2016   HCT 38.7 07/30/2016   MCV 96.8 07/30/2016   PLT 214 07/30/2016   NEUTROABS 4.0 07/30/2016    ASSESSMENT & PLAN:  Breast cancer of lower-outer quadrant of left female breast ([place]) Left lumpectomy 09/25/2015: IDC with DCIS, 1.2 cm, margins negative, 0/4 lymph nodes negative, grade 3, ER 70%, PR 5%, HER-2 positive ratio 3.45, Ki-67 10%, T1 CN 0 stage IA  Treatment plan: 1. Adjuvant chemotherapy with TCH 6 cycles 10/31/2015- 2/9/2018followed by Herceptin every 3 weeks for 1 yearcompleted 07/30/2016 2. Followed by adjuvant radiation 03/10/2016- 04/23/2016 3. Followed by adjuvant antiestrogen therapystarted 05/28/2016 ---------------------------------------------------------------------------------------------------------------------------------- Letrozole toxicities: Severe heat sensation, Tendinitis Patient is experiencing severe pain in the right forearm.  Because of this we switched her to anastrozole  Anastrozole toxicities : Night sweats have disappeared. She has tendinitis in her right elbow but she attributes it to arthritis and she is  seeing an orthopedic specialist for this.  Survivorship: I encouraged her to continue with her daily walking exercise plan.  Right forearm x-ray 02/01/2017: Negative CT CAP 02/07/2017: No evidence of metastatic disease, 4 mm subpleural left lower lobe nodule nonspecific Bone scan 02/07/2017: No findings of metastatic disease  Return to clinic in 1 year for follow-up   No orders of the defined types were placed in this encounter.  The patient has a good understanding of the overall plan. she agrees with it. she will call with any problems that may develop before the next visit here.   Harriette Ohara, MD 05/10/17

## 2017-05-10 NOTE — Telephone Encounter (Signed)
Gave avs and calendar ° °

## 2017-05-11 ENCOUNTER — Other Ambulatory Visit: Payer: Self-pay | Admitting: Physician Assistant

## 2017-05-11 DIAGNOSIS — E063 Autoimmune thyroiditis: Principal | ICD-10-CM

## 2017-05-11 DIAGNOSIS — E038 Other specified hypothyroidism: Secondary | ICD-10-CM

## 2017-05-23 ENCOUNTER — Other Ambulatory Visit: Payer: Self-pay | Admitting: Physician Assistant

## 2017-05-23 DIAGNOSIS — E038 Other specified hypothyroidism: Secondary | ICD-10-CM

## 2017-05-23 DIAGNOSIS — E063 Autoimmune thyroiditis: Principal | ICD-10-CM

## 2017-06-03 ENCOUNTER — Ambulatory Visit: Payer: BLUE CROSS/BLUE SHIELD | Admitting: Physician Assistant

## 2017-06-07 ENCOUNTER — Other Ambulatory Visit: Payer: Self-pay | Admitting: Physician Assistant

## 2017-06-07 DIAGNOSIS — E038 Other specified hypothyroidism: Secondary | ICD-10-CM

## 2017-06-07 DIAGNOSIS — E063 Autoimmune thyroiditis: Principal | ICD-10-CM

## 2017-06-07 NOTE — Telephone Encounter (Signed)
Pt did not keep follow up visit with you as instructed. Do you want to fill?

## 2017-06-10 ENCOUNTER — Ambulatory Visit (INDEPENDENT_AMBULATORY_CARE_PROVIDER_SITE_OTHER): Payer: BLUE CROSS/BLUE SHIELD | Admitting: Physician Assistant

## 2017-06-10 ENCOUNTER — Encounter: Payer: Self-pay | Admitting: Physician Assistant

## 2017-06-10 VITALS — BP 138/88 | HR 97 | Ht 62.0 in | Wt 174.0 lb

## 2017-06-10 DIAGNOSIS — Z170 Estrogen receptor positive status [ER+]: Secondary | ICD-10-CM | POA: Diagnosis not present

## 2017-06-10 DIAGNOSIS — Z13220 Encounter for screening for lipoid disorders: Secondary | ICD-10-CM | POA: Diagnosis not present

## 2017-06-10 DIAGNOSIS — M26622 Arthralgia of left temporomandibular joint: Secondary | ICD-10-CM

## 2017-06-10 DIAGNOSIS — E063 Autoimmune thyroiditis: Secondary | ICD-10-CM

## 2017-06-10 DIAGNOSIS — C50512 Malignant neoplasm of lower-outer quadrant of left female breast: Secondary | ICD-10-CM | POA: Diagnosis not present

## 2017-06-10 DIAGNOSIS — Z131 Encounter for screening for diabetes mellitus: Secondary | ICD-10-CM

## 2017-06-10 DIAGNOSIS — Z0000 Encounter for general adult medical examination without abnormal findings: Secondary | ICD-10-CM | POA: Diagnosis not present

## 2017-06-10 MED ORDER — MELOXICAM 15 MG PO TABS: 15.0000 mg | ORAL_TABLET | Freq: Every day | ORAL | 2 refills | Status: DC

## 2017-06-10 NOTE — Patient Instructions (Addendum)
shingrix waiting list.  Fasting labs.

## 2017-06-10 NOTE — Progress Notes (Addendum)
Subjective:    Patient ID: Jenna [name], female    DOB: [birth year], 57 y.o.   MRN: [id number]  HPI  Pt is a 57 yo female who presents to the clinic for follow up and CPE. Pt had breast cancer and in remission. She continues to follow up with oncologist for next 5 years. She feels pretty good. She does have some joint pain off and on. She also complains of some left jaw pain in front of ear. Worse with a lot of chewing or eating a big sandwich. Not tried anything to make better. No known trauma. NOt aware of any grinding of teeth at night.   .. Active Ambulatory Problems    Diagnosis Date Noted  . Hashimoto's thyroiditis 02/25/2014  . GERD (gastroesophageal reflux disease) 02/25/2014  . Allergic rhinitis 02/25/2014  . Uterine fibroid 02/25/2014  . Mixed hyperlipidemia 02/25/2014  . Multiple joint pain 02/25/2014  . Postmenopausal 10/29/2014  . Insomnia 12/11/2014  . Irritability and anger 12/11/2014  . Anxiety and depression 12/11/2014  . Hypertriglyceridemia 08/13/2015  . Constipation 08/15/2015  . Family history of systemic lupus erythematosus 08/15/2015  . Bloating 08/15/2015  . Multiple food allergies 08/19/2015  . Left breast mass 09/02/2015  . Breast cancer of lower-outer quadrant of left female breast ([place]) 09/12/2015  . Malignant neoplasm of lower-outer quadrant of left breast of female, estrogen receptor positive ([place]) 10/13/2015  . Antineoplastic chemotherapy induced anemia 12/12/2015  . Chemotherapy-induced thrombocytopenia 01/23/2016  . Current moderate episode of major depressive disorder without prior episode ([place]) 07/16/2016  . Hyperkalemia 07/16/2016  . Breast tenderness 07/16/2016  . Port catheter in place 07/30/2016   Resolved Ambulatory Problems    Diagnosis Date Noted  . No Resolved Ambulatory Problems   Past Medical History:  Diagnosis Date  . Anxiety   . Asthma   . Breast cancer of lower-outer quadrant of left female breast ([place]) 09/12/2015  .  Complication of anesthesia   . Depression   . GERD (gastroesophageal reflux disease)   . Hashimoto's disease   . History of external beam radiation therapy 03/10/16-04/23/16  . Hypothyroidism   . Irritable bowel   . Uterine displacement    .Marland Kitchen Family History  Problem Relation Age of Onset  . Diabetes Mother   . Hypertension Mother   . Hyperlipidemia Mother   . Kidney disease Mother   . Cancer Maternal Grandfather    .Marland Kitchen Social History   Socioeconomic History  . Marital status: Married    Spouse name: Not on file  . Number of children: Not on file  . Years of education: Not on file  . Highest education level: Not on file  Occupational History  . Not on file  Social Needs  . Financial resource strain: Not on file  . Food insecurity:    Worry: Not on file    Inability: Not on file  . Transportation needs:    Medical: Not on file    Non-medical: Not on file  Tobacco Use  . Smoking status: Former Research scientist (life sciences)  . Smokeless tobacco: Never Used  Substance and Sexual Activity  . Alcohol use: Yes    Alcohol/week: 0.6 oz    Types: 1 Standard drinks or equivalent per week    Comment: social  . Drug use: No  . Sexual activity: Yes    Partners: Male  Lifestyle  . Physical activity:    Days per week: Not on file    Minutes per session: Not on file  .  Stress: Not on file  Relationships  . Social connections:    Talks on phone: Not on file    Gets together: Not on file    Attends religious service: Not on file    Active member of club or organization: Not on file    Attends meetings of clubs or organizations: Not on file    Relationship status: Not on file  . Intimate partner violence:    Fear of current or ex partner: Not on file    Emotionally abused: Not on file    Physically abused: Not on file    Forced sexual activity: Not on file  Other Topics Concern  . Not on file  Social History Narrative  . Not on file     Review of Systems  All other systems reviewed and  are negative.      Objective:   Physical Exam  Constitutional: She is oriented to person, place, and time. She appears well-developed and well-nourished.  HENT:  Head: Normocephalic and atraumatic.  Right Ear: External ear normal.  Left Ear: External ear normal.  Nose: Nose normal.  Eyes: Pupils are equal, round, and reactive to light. Conjunctivae and EOM are normal.  Neck: Normal range of motion. Neck supple. No thyromegaly present.  Cardiovascular: Normal rate and regular rhythm.  Pulmonary/Chest: Effort normal and breath sounds normal.  Abdominal: Soft. Bowel sounds are normal. She exhibits no distension and no mass. There is no tenderness. There is no rebound and no guarding.  Musculoskeletal: Normal range of motion.  Tenderness over left TMJ joint. No crepitus.   Neurological: She is alert and oriented to person, place, and time.  Psychiatric: She has a normal mood and affect. Her behavior is normal.          Assessment & Plan:   Marland KitchenMarland KitchenArbie [name] was seen today for joint pain.  Diagnoses and all orders for this visit:  Preventative health care -     CBC with Differential/Platelet  Malignant neoplasm of lower-outer quadrant of left breast of female, estrogen receptor positive ([place])  Hashimoto's thyroiditis -     TSH  Screening for diabetes mellitus -     COMPLETE METABOLIC PANEL WITH GFR  Screening for lipid disorders -     Lipid Panel w/reflex Direct LDL  Arthralgia of left temporomandibular joint  Other orders -     meloxicam (MOBIC) 15 MG tablet; Take 1 tablet (15 mg total) by mouth daily.    .. Depression screen [facility] 2/9 06/10/2017 07/16/2016 06/03/2016  Decreased Interest 1 2 0  Down, Depressed, Hopeless 2 2 0  PHQ - 2 Score 3 4 0  Altered sleeping 3 2 -  Tired, decreased energy 1 2 -  Change in appetite 0 3 -  Feeling bad or failure about yourself  2 2 -  Trouble concentrating 1 1 -  Moving slowly or fidgety/restless 1 2 -  Suicidal thoughts 0 0 -  PHQ-9  Score 11 16 -   .Marland Kitchen GAD 7 : Generalized Anxiety Score 06/10/2017  Nervous, Anxious, on Edge 1  Control/stop worrying 1  Worry too much - different things 1  Trouble relaxing 2  Restless 1  Easily annoyed or irritable 2  Afraid - awful might happen 2  Total GAD 7 Score 10  Anxiety Difficulty Somewhat difficult   .Marland Kitchen Discussed 150 minutes of exercise a week.  Encouraged vitamin D 1000 units and Calcium 1300mg  or 4 servings of dairy a day.  Fasting labs ordered.  Mammogram/pap/colonoscopy up to date. Mammogram to be ordered every 51yr at this point.  Discussed shingrix. Pt to be put on waiting list.  Other vaccines up to date.   Ordered TSH and will adjust medication accordingly.   Discussed bone pain to and to follow up if persistent and needs work up. As neede mobic given as well.   I do think jaw pain is TMJ. Given exercises. Consider dental appliance if grinding teeth at night. mobic could also help. Ice as needed. Avoid triggers such as "chewing gum/big sandwiches, etc".

## 2017-06-11 LAB — COMPLETE METABOLIC PANEL WITH GFR
AG Ratio: 1.3 (calc) (ref 1.0–2.5)
ALKALINE PHOSPHATASE (APISO): 84 U/L (ref 33–130)
ALT: 18 U/L (ref 6–29)
AST: 22 U/L (ref 10–35)
Albumin: 4.1 g/dL (ref 3.6–5.1)
BUN: 11 mg/dL (ref 7–25)
CALCIUM: 9.9 mg/dL (ref 8.6–10.4)
CO2: 28 mmol/L (ref 20–32)
CREATININE: 0.77 mg/dL (ref 0.50–1.05)
Chloride: 104 mmol/L (ref 98–110)
GFR, EST NON AFRICAN AMERICAN: 87 mL/min/{1.73_m2} (ref >=60)
GFR, Est African American: 101 mL/min/{1.73_m2} (ref >=60)
GLOBULIN: 3.2 g/dL (ref 1.9–3.7)
GLUCOSE: 92 mg/dL (ref 65–99)
Potassium: 5.2 mmol/L (ref 3.5–5.3)
Sodium: 143 mmol/L (ref 135–146)
Total Bilirubin: 0.5 mg/dL (ref 0.2–1.2)
Total Protein: 7.3 g/dL (ref 6.1–8.1)

## 2017-06-11 LAB — LIPID PANEL W/REFLEX DIRECT LDL
CHOL/HDL RATIO: 3.2 (calc) (ref <5.0)
CHOLESTEROL: 261 mg/dL — AB (ref <200)
HDL: 82 mg/dL (ref >50)
LDL Cholesterol (Calc): 156 mg/dL (calc) — ABNORMAL HIGH
NON-HDL CHOLESTEROL (CALC): 179 mg/dL — AB (ref <130)
Triglycerides: 117 mg/dL (ref <150)

## 2017-06-11 LAB — CBC WITH DIFFERENTIAL/PLATELET
BASOS PCT: 0.5 %
Basophils Absolute: 39 cells/uL (ref 0–200)
EOS PCT: 1.1 %
Eosinophils Absolute: 86 cells/uL (ref 15–500)
HEMATOCRIT: 40.4 % (ref 35.0–45.0)
Hemoglobin: 13.8 g/dL (ref 11.7–15.5)
LYMPHS ABS: 1989 {cells}/uL (ref 850–3900)
MCH: 31.2 pg (ref 27.0–33.0)
MCHC: 34.2 g/dL (ref 32.0–36.0)
MCV: 91.4 fL (ref 80.0–100.0)
MPV: 11.2 fL (ref 7.5–12.5)
Monocytes Relative: 7.2 %
NEUTROS ABS: 5125 {cells}/uL (ref 1500–7800)
Neutrophils Relative %: 65.7 %
Platelets: 267 10*3/uL (ref 140–400)
RBC: 4.42 10*6/uL (ref 3.80–5.10)
RDW: 11.8 % (ref 11.0–15.0)
Total Lymphocyte: 25.5 %
WBC: 7.8 10*3/uL (ref 3.8–10.8)
WBCMIX: 562 {cells}/uL (ref 200–950)

## 2017-06-11 LAB — TSH: TSH: 0.43 mIU/L

## 2017-06-13 DIAGNOSIS — M26622 Arthralgia of left temporomandibular joint: Secondary | ICD-10-CM | POA: Insufficient documentation

## 2017-06-14 NOTE — Progress Notes (Signed)
Call pt: cholesterol is down some. Overall risk is still low 1.6 percent. Thyroid is low normal. If you feel good ok to refill for 6 months.  All other labs look good.

## 2017-07-15 ENCOUNTER — Other Ambulatory Visit: Payer: Self-pay | Admitting: Physician Assistant

## 2017-07-15 DIAGNOSIS — E063 Autoimmune thyroiditis: Principal | ICD-10-CM

## 2017-07-15 DIAGNOSIS — E038 Other specified hypothyroidism: Secondary | ICD-10-CM

## 2017-07-19 ENCOUNTER — Encounter: Payer: Self-pay | Admitting: Physician Assistant

## 2017-07-19 DIAGNOSIS — E063 Autoimmune thyroiditis: Principal | ICD-10-CM

## 2017-07-19 DIAGNOSIS — E038 Other specified hypothyroidism: Secondary | ICD-10-CM

## 2017-07-19 MED ORDER — LIOTHYRONINE SODIUM 50 MCG PO TABS: ORAL_TABLET | ORAL | 1 refills | Status: DC

## 2017-08-30 ENCOUNTER — Other Ambulatory Visit: Payer: Self-pay | Admitting: Family Medicine

## 2017-08-30 DIAGNOSIS — Z853 Personal history of malignant neoplasm of breast: Secondary | ICD-10-CM

## 2017-09-03 IMAGING — MG IR ULTRASOUND GUIDED ASPIRATION/DRAINAGE
3 series · 3 of 3 positions shown · non-contrast
Comparison: none

INDICATION: Ultrasound guided needle localization of 2 left breast masses. The 2
masses were noted to be immediately adjacent on ultrasound spanning
a total dimension of 1.7 cm. Two clips were left at the time of
biopsy. The posterior clip was with in 1 of the masses. However, the
more anterior clip was thought to have migrated anteriorly at the
time of biopsy. Upon review today, this clip also appeared to be
laterally located and not directly associated with the second mass.
As a result, both masses were localized under ultrasound to ensure
that the wires truly localize both suspicious masses.

[L CC (1 of 2)]
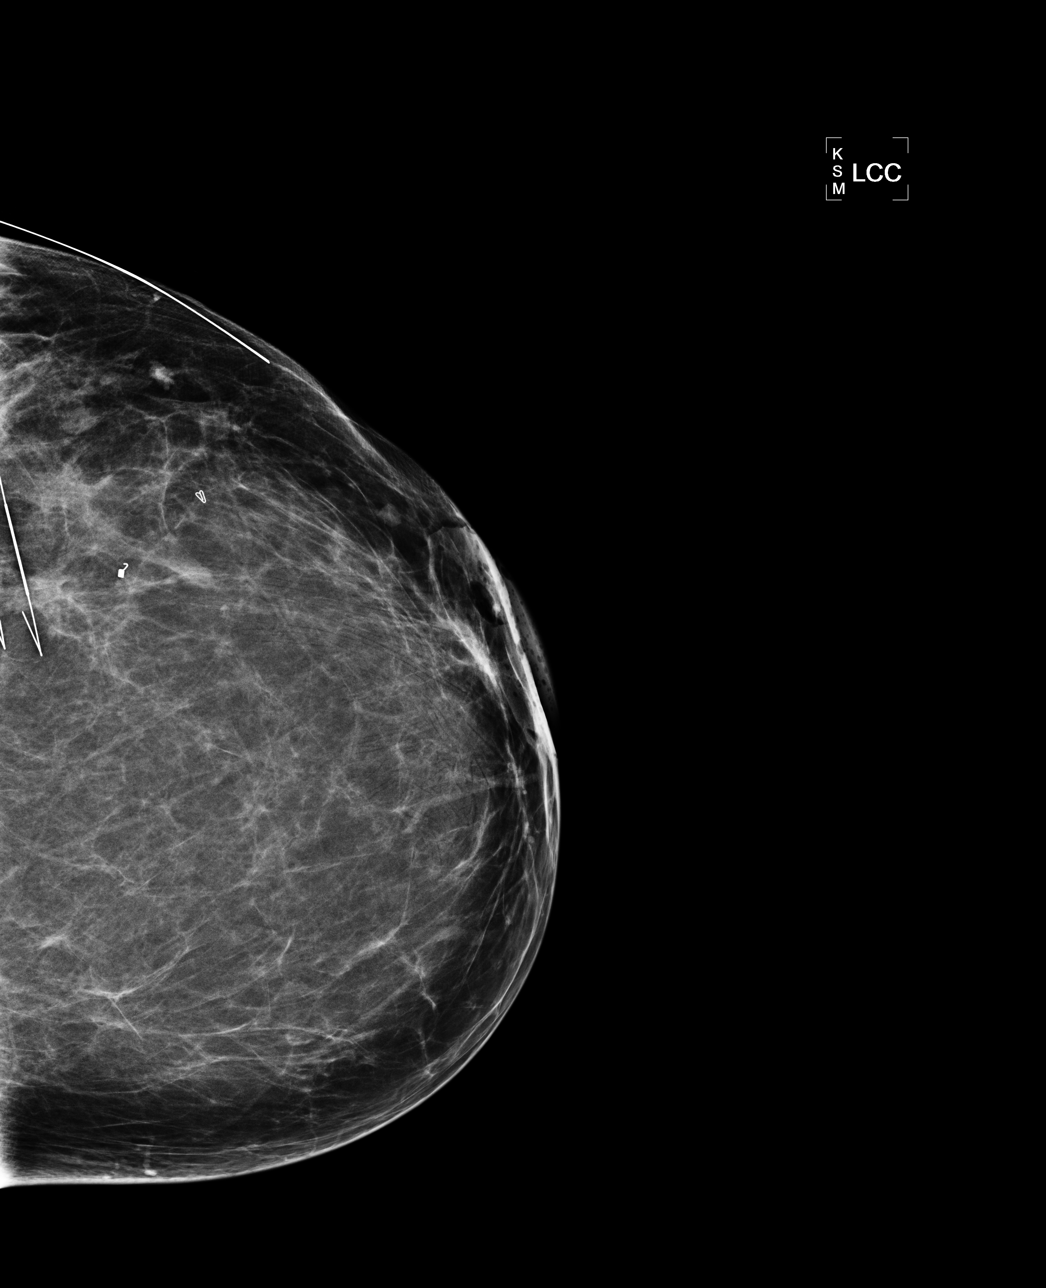

[L CC (2 of 2)]
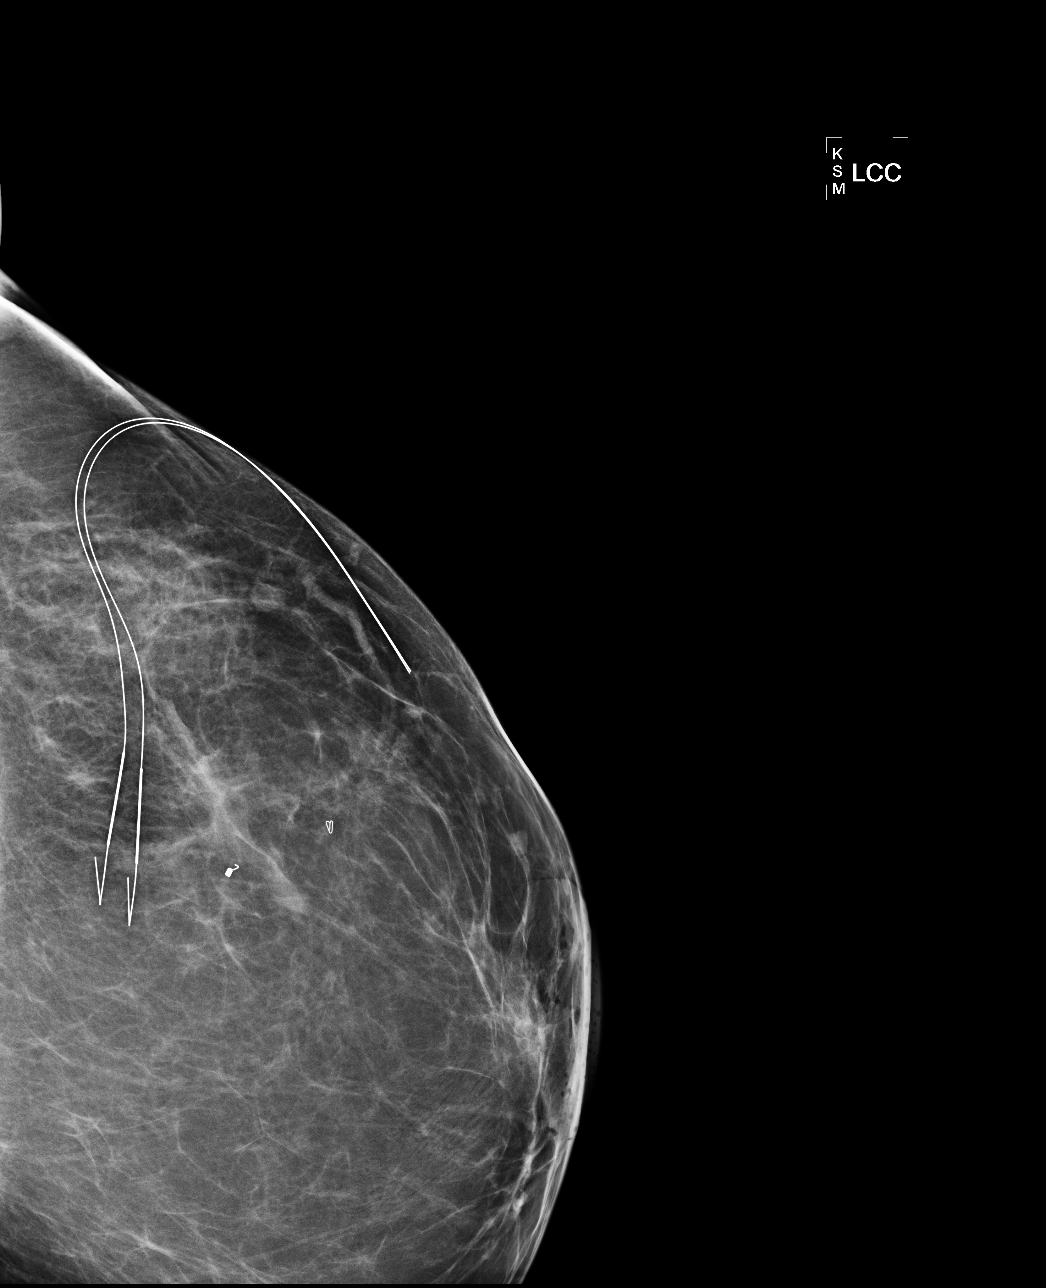

[L ML]
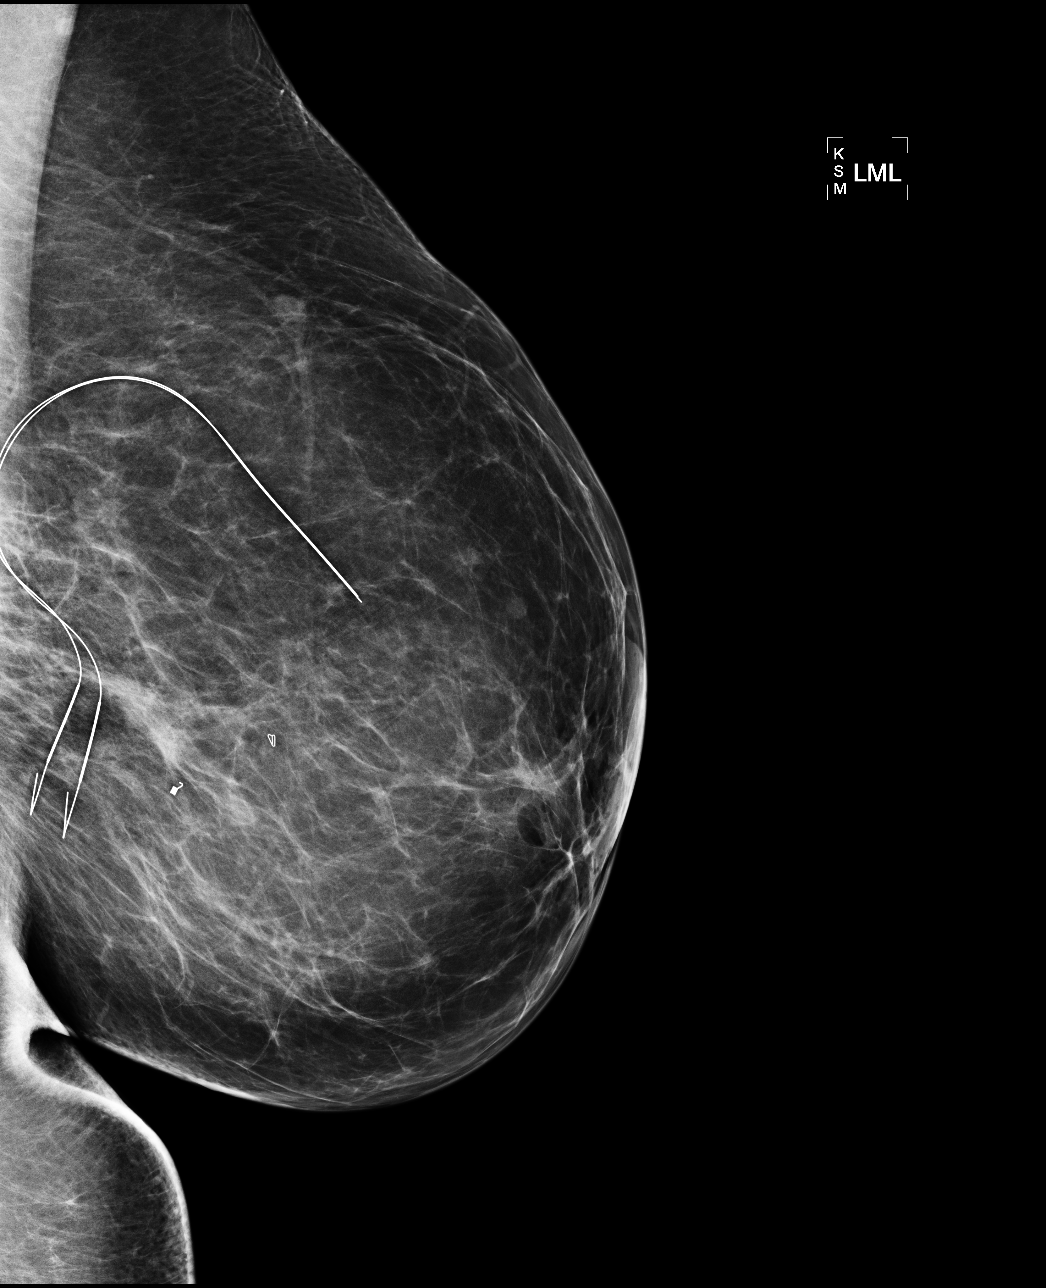

[3 of 3 positions shown; findings below may reference images not displayed]

EXAM:
ULTRASOUND GUIDED NEEDLE LOCALIZATION

COMPLICATIONS:
None immediate.

PROCEDURE:
Informed written consent was obtained from the patient after a
thorough discussion of the procedural risks, benefits and
alternatives. All questions were addressed. A timeout was performed
prior to the initiation of the procedure.

The skin was cleaned and numbed. A 7 cm wire was placed through the
more posterior mass. A second 7 cm wire was placed through the more
anterior mass. A follow-up mammogram will be obtained and sent to
surgery with the patient.
IMPRESSION: Needle localization of 2 adjacent masses in the left breast.

## 2017-09-13 ENCOUNTER — Other Ambulatory Visit: Payer: Self-pay | Admitting: Family Medicine

## 2017-09-13 ENCOUNTER — Other Ambulatory Visit: Payer: Self-pay

## 2017-09-13 DIAGNOSIS — Z853 Personal history of malignant neoplasm of breast: Secondary | ICD-10-CM

## 2017-09-15 ENCOUNTER — Ambulatory Visit
Admission: RE | Admit: 2017-09-15 | Discharge: 2017-09-15 | Disposition: A | Discharge status: Home / Self Care | Payer: BLUE CROSS/BLUE SHIELD | Source: Ambulatory Visit | Attending: Family Medicine | Admitting: Family Medicine

## 2017-09-15 DIAGNOSIS — R928 Other abnormal and inconclusive findings on diagnostic imaging of breast: Secondary | ICD-10-CM | POA: Diagnosis not present

## 2017-09-15 DIAGNOSIS — Z853 Personal history of malignant neoplasm of breast: Secondary | ICD-10-CM | POA: Diagnosis not present

## 2017-09-15 HISTORY — DX: Personal history of antineoplastic chemotherapy: Z92.21

## 2017-09-15 HISTORY — DX: Personal history of irradiation: Z92.3

## 2017-09-16 ENCOUNTER — Ambulatory Visit: Payer: BLUE CROSS/BLUE SHIELD | Admitting: Physician Assistant

## 2017-10-14 ENCOUNTER — Other Ambulatory Visit: Payer: Self-pay | Admitting: Hematology and Oncology

## 2017-10-31 ENCOUNTER — Encounter: Payer: Self-pay | Admitting: Hematology and Oncology

## 2018-01-04 ENCOUNTER — Other Ambulatory Visit: Payer: Self-pay | Admitting: Physician Assistant

## 2018-01-04 DIAGNOSIS — E038 Other specified hypothyroidism: Secondary | ICD-10-CM

## 2018-01-04 DIAGNOSIS — E063 Autoimmune thyroiditis: Principal | ICD-10-CM

## 2018-01-12 ENCOUNTER — Other Ambulatory Visit: Payer: Self-pay | Admitting: Hematology and Oncology

## 2018-03-29 ENCOUNTER — Telehealth: Payer: BLUE CROSS/BLUE SHIELD | Admitting: Family

## 2018-03-29 DIAGNOSIS — R6889 Other general symptoms and signs: Secondary | ICD-10-CM

## 2018-03-29 DIAGNOSIS — Z7189 Other specified counseling: Secondary | ICD-10-CM

## 2018-03-29 MED ORDER — BENZONATATE 100 MG PO CAPS: 100.0000 mg | ORAL_CAPSULE | Freq: Three times a day (TID) | ORAL | 0 refills | Status: DC | PRN

## 2018-03-29 NOTE — Progress Notes (Signed)
E-Visit for Corona Virus Screening  Based on your current symptoms, you may very well have the virus, however your symptoms are mild. Currently, not all patients are being tested. If the symptoms are mild and there is not a known exposure, performing the test is not indicated.   Approximately 5 minutes was spent documenting and reviewing patient's chart.    Coronavirus disease 2019 (COVID-19) is a respiratory illness that can spread from person to person. The virus that causes COVID-19 is a new virus that was first identified in the country of [place] but is now found in multiple other countries and has spread to the [place].  Symptoms associated with the virus are mild to severe fever, cough, and shortness of breath. There is currently no vaccine to protect against COVID-19, and there is no specific antiviral treatment for the virus.   To be considered HIGH RISK for Coronavirus (COVID-19), you have to meet the following criteria:  . Traveled to [place], [place], [place], [place] or [place]; or in the [place] to [place], [place], [place], or [place]; and have fever, cough, and shortness of breath within the last 2 weeks of travel OR  . Been in close contact with a person diagnosed with COVID-19 within the last 2 weeks and have fever, cough, and shortness of breath  . IF YOU DO NOT MEET THESE CRITERIA, YOU ARE CONSIDERED LOW RISK FOR COVID-19.   It is vitally important that if you feel that you have an infection such as this virus or any other virus that you stay home and away from places where you may spread it to others.  You should self-quarantine for 14 days if you have symptoms that could potentially be coronavirus and avoid contact with people age 65 and older.   You can use medication such as A prescription cough medication called Tessalon Perles 100 mg. You may take 1-2 capsules every 8 hours as needed for cough  You may also take acetaminophen (Tylenol) as needed for  fever.   Reduce your risk of any infection by using the same precautions used for avoiding the common cold or flu:  . Wash your hands often with soap and warm water for at least 20 seconds.  If soap and water are not readily available, use an alcohol-based hand sanitizer with at least 60% alcohol.  . If coughing or sneezing, cover your mouth and nose by coughing or sneezing into the elbow areas of your shirt or coat, into a tissue or into your sleeve (not your hands). . Avoid shaking hands with others and consider head nods or verbal greetings only. . Avoid touching your eyes, nose, or mouth with unwashed hands.  . Avoid close contact with people who are sick. . Avoid places or events with large numbers of people in one location, like concerts or sporting events. . Carefully consider travel plans you have or are making. . If you are planning any travel outside or inside the [place], visit the CDC's Travelers' Health webpage for the latest health notices. . If you have some symptoms but not all symptoms, continue to monitor at home and seek medical attention if your symptoms worsen. . If you are having a medical emergency, call 911.  HOME CARE . Only take medications as instructed by your medical team. . Drink plenty of fluids and get plenty of rest. . A steam or ultrasonic humidifier can help if you have congestion.   GET HELP RIGHT AWAY IF: .   You develop worsening fever. . You become short of breath . You cough up blood. . Your symptoms become more severe MAKE SURE YOU   Understand these instructions.  Will watch your condition.  Will get help right away if you are not doing well or get worse.  Your e-visit answers were reviewed by a board certified advanced clinical practitioner to complete your personal care plan.  Depending on the condition, your plan could have included both over the counter or prescription medications.  If there is a problem please reply once you have received a  response from your provider. Your safety is important to us.  If you have drug allergies check your prescription carefully.    You can use MyChart to ask questions about today's visit, request a non-urgent call back, or ask for a work or school excuse for 24 hours related to this e-Visit. If it has been greater than 24 hours you will need to follow up with your provider, or enter a new e-Visit to address those concerns. You will get an e-mail in the next two days asking about your experience.  I hope that your e-visit has been valuable and will speed your recovery. Thank you for using e-visits.    

## 2018-04-07 ENCOUNTER — Telehealth: Payer: Self-pay | Admitting: Physician Assistant

## 2018-04-07 DIAGNOSIS — E039 Hypothyroidism, unspecified: Secondary | ICD-10-CM

## 2018-04-07 NOTE — Telephone Encounter (Signed)
"  painful lump on my right wrist above thumb and thyroid labwork"  Jenna [name] this patient is requesting the above appointment via mychart - can a lab order be put in for her thyroid labwork? I can schedule patient to come in to see Dr T or Dr C for the lump on her wrist.

## 2018-04-09 NOTE — Telephone Encounter (Signed)
Ordered placed for TSH to be drawn.

## 2018-04-10 NOTE — Telephone Encounter (Signed)
Unable to leave a message,mailbox is full. 

## 2018-04-13 ENCOUNTER — Other Ambulatory Visit: Payer: Self-pay | Admitting: Physician Assistant

## 2018-04-13 DIAGNOSIS — E038 Other specified hypothyroidism: Secondary | ICD-10-CM

## 2018-04-13 DIAGNOSIS — E063 Autoimmune thyroiditis: Principal | ICD-10-CM

## 2018-04-13 NOTE — Telephone Encounter (Signed)
One month supply sent for Liothyronine with note to the pharmacy that patient needs an  appt/labs drawn. She contacted the office last week to have TSH completed/appt scheduled. Labs have been ordered but not completed.

## 2018-04-20 ENCOUNTER — Encounter: Payer: Self-pay | Admitting: Hematology and Oncology

## 2018-04-24 ENCOUNTER — Ambulatory Visit (INDEPENDENT_AMBULATORY_CARE_PROVIDER_SITE_OTHER): Payer: 59 | Admitting: Sports Medicine

## 2018-04-24 ENCOUNTER — Encounter: Payer: Self-pay | Admitting: Sports Medicine

## 2018-04-24 DIAGNOSIS — M654 Radial styloid tenosynovitis [de Quervain]: Secondary | ICD-10-CM | POA: Diagnosis not present

## 2018-04-24 NOTE — Progress Notes (Signed)
Subjective:    CC: Right wrist pain  HPI: This is a pleasant 58 year old female, for the past several weeks she has had severe pain in her right wrist, localized over the radial styloid process with radiation up the forearm halfway.  Worse with gripping, and ulnar deviation of the wrist.  She has tried oral medications, analgesics without much improvement.  I reviewed the past medical history, family history, social history, surgical history, and allergies today and no changes were needed.  Please see the problem list section below in epic for further details.  Past Medical History: Past Medical History:  Diagnosis Date  . Anxiety   . Asthma   . Breast cancer of lower-outer quadrant of left female breast ([facility]) 09/12/2015  . Complication of anesthesia    hard time waking up with gallbladder  . Depression   . GERD (gastroesophageal reflux disease)   . Hashimoto's disease   . History of external beam radiation therapy 03/10/16-04/23/16   left breast/50.4 Gy in 28 fractions, left breast boost/10 Gy in 5 fractions  . Hypothyroidism   . Irritable bowel   . Personal history of chemotherapy 2017-2018  . Personal history of radiation therapy 2018  . Uterine displacement    Past Surgical History: Past Surgical History:  Procedure Laterality Date  . BREAST BIOPSY Left 09/10/2015  . BREAST LUMPECTOMY Left 09/25/2015  . BREAST LUMPECTOMY WITH NEEDLE LOCALIZATION AND AXILLARY SENTINEL LYMPH NODE BX Left 09/25/2015   Procedure: BREAST LUMPECTOMY WITH NEEDLE LOCALIZATION X'S 2 AND AXILLARY SENTINEL LYMPH NODE BX;  Surgeon: Erroll Luna, MD;  Location: [place];  Service: General;  Laterality: Left;  . CHOLECYSTECTOMY OPEN    . disk fusion    . ENDOMETRIAL ABLATION    . MASTOPEXY Bilateral 10/25/2016   Procedure: BILATERAL MASTOPEXY;  Surgeon: Irene Limbo, MD;  Location: [place];  Service: Plastics;  Laterality: Bilateral;  . neck fusion    .  PORT-A-CATH REMOVAL Right 10/25/2016   Procedure: RIGHT CHEST PORT REMOVAL;  Surgeon: Irene Limbo, MD;  Location: [place];  Service: Plastics;  Laterality: Right;  . PORTACATH PLACEMENT Right 09/25/2015   Procedure: INSERTION PORT-A-CATH;  Surgeon: Erroll Luna, MD;  Location: [place];  Service: General;  Laterality: Right;  . REDUCTION MAMMAPLASTY Bilateral 2018   Social History: Social History   Socioeconomic History  . Marital status: Married    Spouse name: Not on file  . Number of children: Not on file  . Years of education: Not on file  . Highest education level: Not on file  Occupational History  . Not on file  Social Needs  . Financial resource strain: Not on file  . Food insecurity:    Worry: Not on file    Inability: Not on file  . Transportation needs:    Medical: Not on file    Non-medical: Not on file  Tobacco Use  . Smoking status: Former Research scientist (life sciences)  . Smokeless tobacco: Never Used  Substance and Sexual Activity  . Alcohol use: Yes    Alcohol/week: 1.0 standard drinks    Types: 1 Standard drinks or equivalent per week    Comment: social  . Drug use: No  . Sexual activity: Yes    Partners: Male  Lifestyle  . Physical activity:    Days per week: Not on file    Minutes per session: Not on file  . Stress: Not on file  Relationships  . Social connections:  Talks on phone: Not on file    Gets together: Not on file    Attends religious service: Not on file    Active member of club or organization: Not on file    Attends meetings of clubs or organizations: Not on file    Relationship status: Not on file  Other Topics Concern  . Not on file  Social History Narrative  . Not on file   Family History: Family History  Problem Relation Age of Onset  . Diabetes Mother   . Hypertension Mother   . Hyperlipidemia Mother   . Kidney disease Mother   . Cancer Maternal Grandfather    Allergies: Allergies  Allergen  Reactions  . Latex Rash  . Sulfa Antibiotics Rash   Medications: See med rec.  Review of Systems: No fevers, chills, night sweats, weight loss, chest pain, or shortness of breath.   Objective:    General: Well Developed, well nourished, and in no acute distress.  Neuro: Alert and oriented x3, extra-ocular muscles intact, sensation grossly intact.  HEENT: Normocephalic, atraumatic, pupils equal round reactive to light, neck supple, no masses, no lymphadenopathy, thyroid nonpalpable.  Skin: Warm and dry, no rashes. Cardiac: Regular rate and rhythm, no murmurs rubs or gallops, no lower extremity edema.  Respiratory: Clear to auscultation bilaterally. Not using accessory muscles, speaking in full sentences. Right wrist: Tender to palpation over the first extensor compartment with a palpable ganglion cyst ROM smooth and normal with good flexion and extension and ulnar/radial deviation that is symmetrical with opposite wrist. Palpation is normal over metacarpals, navicular, lunate, and TFCC; tendons without tenderness/ swelling No snuffbox tenderness. No tenderness over Canal of [place]. Strength 5/5 in all directions without pain. Negative tinel's and phalens signs. Positive Finkelstein sign. Negative Watson's test.  Procedure: Real-time Ultrasound Guided injection of the right first extensor compartment Device: GE Logiq E  Verbal informed consent obtained.  Time-out conducted.  Noted no overlying erythema, induration, or other signs of local infection.  Skin prepped in a sterile fashion.  Local anesthesia: Topical Ethyl chloride.  With sterile technique and under real time ultrasound guidance:  Noted first extensor compartment ganglion cyst, I advanced a 25-gauge needle into the cyst as well as into the tendon sheath and injected medication, the injection was very difficult suggesting significant stenosing tenosynovitis.  A total of 1 cc Kenalog 40, 1 cc lidocaine, 1 cc bupivacaine was  used. Completed without difficulty  Pain immediately resolved suggesting accurate placement of the medication.  Advised to call if fevers/chills, erythema, induration, drainage, or persistent bleeding.  Images permanently stored and available for review in the ultrasound unit.  Impression: Technically successful ultrasound guided injection.  Impression and Recommendations:    De Quervain's tenosynovitis, right Severe pain, also a tendon sheath ganglion. Injection as above, rehab exercises given, return to see me in 1 month.   ___________________________________________ Gwen Her. Dianah Field, M.D., ABFM., CAQSM. Primary Care and Sports Medicine [facility]  Adjunct Professor of [employer]

## 2018-04-24 NOTE — Assessment & Plan Note (Signed)
Severe pain, also a tendon sheath ganglion. Injection as above, rehab exercises given, return to see me in 1 month.

## 2018-05-10 NOTE — Progress Notes (Signed)
°HEMATOLOGY-ONCOLOGY video VISIT PROGRESS NOTE ° °I connected with Alberto M Pasquariello on 05/11/2018 at  9:15 AM EDT by Webex video conference and verified that I am speaking with the correct person using two identifiers.  °I discussed the limitations, risks, security and privacy concerns of performing an evaluation and management service by Webex and the availability of in person appointments.  °I also discussed with the patient that there may be a patient responsible charge related to this service. The patient expressed understanding and agreed to proceed.  °Patient's Location: Home °Physician Location: Clinic ° °CHIEF COMPLIANT: Follow-up on anastrozole therapy ° °INTERVAL HISTORY: Jenna [name] is a 58 y.o. female with above-mentioned history of left breast cancer treated with lumpectomy, adjuvant chemotherapy, radiation, and anti-estrogen therapy with anastrozole. Her most recent mammogram on 09/15/17 showed no evidence of malignancy bilaterally. She presents today over Webex for annual follow-up.  ° °  °Breast cancer of lower-outer quadrant of left female breast ([facility])  ° 09/10/2015 Initial Diagnosis  °  Screening detected left breast asymmetry: 2 adjacent masses 4:00: 8 mm and 3:30: 9 mm; axilla negative, biopsy grade 2-3 IDC, ER 70%, PR 5%, Ki-67 10%, HER-2 positive ratio 3.41, T1BN0 stage IA °  ° 09/25/2015 Surgery  °  Left lumpectomy: IDC with DCIS, 1.2 cm, margins negative, 0/4 lymph nodes negative, grade 3, ER 70%, PR 5%, HER-2 positive ratio 3.45, Ki-67 10%, T1 CN 0 stage IA °  ° 10/31/2015 - 02/13/2016 Chemotherapy  °  Adjuvant chemotherapy with TCH ×6 cycles followed by Herceptin maintenance for 1 year °  ° 03/10/2016 - 04/23/2016 Radiation Therapy  °  Adjuvant radiation therapy °  ° 05/28/2016 -  Anti-estrogen oral therapy  °  Anastrozole 1 mg daily, changed to letrozole after 2 months due to poor tolerance, switched to anastrozole due to right forearm pain from tendinitis °  ° ° °REVIEW OF SYSTEMS:     °Constitutional: Denies fevers, chills or abnormal weight loss °Eyes: Denies blurriness of vision °Ears, nose, mouth, throat, and face: Denies mucositis or sore throat °Respiratory: Denies cough, dyspnea or wheezes °Cardiovascular: Denies palpitation, chest discomfort °Gastrointestinal:  Denies nausea, heartburn or change in bowel habits °Skin: Denies abnormal skin rashes °Lymphatics: Denies new lymphadenopathy or easy bruising °Neurological:Denies numbness, tingling or new weaknesses °Behavioral/Psych: Mood is stable, no new changes  °Extremities: No lower extremity edema °Breast: denies any pain or lumps or nodules in either breasts °All other systems were reviewed with the patient and are negative. ° °Observations/Objective:  °There were no vitals filed for this visit. °There is no height or weight on file to calculate BMI.  °I have reviewed the data as listed °CMP Latest Ref Rng & Units 06/10/2017 07/30/2016 07/16/2016  °Glucose 65 - 99 mg/dL 92 93 103(H)  °BUN 7 - 25 mg/dL 11 15.8 20  °Creatinine 0.50 - 1.05 mg/dL 0.77 0.7 0.92  °Sodium 135 - 146 mmol/L 143 140 140  °Potassium 3.5 - 5.3 mmol/L 5.2 4.4 5.8(H)  °Chloride 98 - 110 mmol/L 104 - 100  °CO2 20 - 32 mmol/L 28 26 29  °Calcium 8.6 - 10.4 mg/dL 9.9 9.4 9.8  °Total Protein 6.1 - 8.1 g/dL 7.3 7.2 7.1  °Total Bilirubin 0.2 - 1.2 mg/dL 0.5 0.51 0.6  °Alkaline Phos 40 - 150 U/L - 85 75  °AST 10 - 35 U/L 22 26 26  °ALT 6 - 29 U/L 18 25 24  ° ° °Lab Results  °Component Value Date  ° WBC 7.8   06/10/2017  ° HGB 13.8 06/10/2017  ° HCT 40.4 06/10/2017  ° MCV 91.4 06/10/2017  ° PLT 267 06/10/2017  ° NEUTROABS 5,125 06/10/2017  ° ° °  °Assessment Plan:  °Breast cancer of lower-outer quadrant of left female breast ([facility]CC) °Left lumpectomy 09/25/2015: IDC with DCIS, 1.2 cm, margins negative, 0/4 lymph nodes negative, grade 3, ER 70%, PR 5%, HER-2 positive ratio 3.45, Ki-67 10%, T1 CN 0 stage IA °  °Treatment plan: °1. Adjuvant chemotherapy with TCH ×6 cycles 10/31/2015-  02/13/2016 followed by Herceptin every 3 weeks for 1 year completed 07/30/2016 °2. Followed by adjuvant radiation 03/10/2016-  04/23/2016  °3. Followed by adjuvant antiestrogen therapy started 05/28/2016 °---------------------------------------------------------------------------------------------------------------------------------- °Anastrozole toxicities:Tendinitis and hair loss °  °Anastrozole toxicities : °Night sweats have disappeared. °Intermittent tendinitis for which she received injections and feeling much better. °  °Survivorship: She is walking for half miles every day along with lots of other physical activities.  She got a new job at [employer].  She has lost 17 pounds in the last year. °  °CT CAP 02/07/2017: No evidence of metastatic disease, 4 mm subpleural left lower lobe nodule nonspecific °Bone scan 02/07/2017: No findings of metastatic disease °  °Mammogram: September 2019: Benign breast density category B °Return to clinic in 1 year for follow-up ° ° ° °I discussed the assessment and treatment plan with the patient. The patient was provided an opportunity to ask questions and all were answered. The patient agreed with the plan and demonstrated an understanding of the instructions. The patient was advised to call back or seek an in-person evaluation if the symptoms worsen or if the condition fails to improve as anticipated.  ° °I provided 12 minutes of face-to-face Web Ex time during this encounter.   ° °Vinay K Gudena, MD °05/11/2018 ° ° I, Molly Dorshimer, am acting as scribe for Vinay Gudena, MD. ° °I have reviewed the above documentation for accuracy and completeness, and I agree with the above. °  ° °

## 2018-05-11 ENCOUNTER — Inpatient Hospital Stay: Payer: 59 | Attending: Hematology and Oncology | Admitting: Hematology and Oncology

## 2018-05-11 DIAGNOSIS — Z170 Estrogen receptor positive status [ER+]: Secondary | ICD-10-CM

## 2018-05-11 DIAGNOSIS — C50512 Malignant neoplasm of lower-outer quadrant of left female breast: Secondary | ICD-10-CM | POA: Diagnosis not present

## 2018-05-11 MED ORDER — ANASTROZOLE 1 MG PO TABS: 1.0000 mg | ORAL_TABLET | Freq: Every day | ORAL | 3 refills | Status: DC

## 2018-05-11 NOTE — Assessment & Plan Note (Signed)
Left lumpectomy 09/25/2015: IDC with DCIS, 1.2 cm, margins negative, 0/4 lymph nodes negative, grade 3, ER 70%, PR 5%, HER-2 positive ratio 3.45, Ki-67 10%, T1 CN 0 stage IA  Treatment plan: 1. Adjuvant chemotherapy with TCH 6 cycles 10/31/2015- 2/9/2018followed by Herceptin every 3 weeks for 1 yearcompleted 07/30/2016 2. Followed by adjuvant radiation 03/10/2016- 04/23/2016 3. Followed by adjuvant antiestrogen therapystarted 05/28/2016 ---------------------------------------------------------------------------------------------------------------------------------- Anastrozole toxicities:Tendinitis and hair loss  Anastrozole toxicities: Night sweats have disappeared. Intermittent tendinitis for which she received injections and feeling much better.  Survivorship: She is walking for half miles every day along with lots of other physical activities.  She got a new job at [employer].  She has lost 17 pounds in the last year.  CT CAP 02/07/2017: No evidence of metastatic disease, 4 mm subpleural left lower lobe nodule nonspecific Bone scan 02/07/2017: No findings of metastatic disease  Mammogram: September 2019: Benign breast density category B Return to clinic in 1 year for follow-up

## 2018-05-12 ENCOUNTER — Other Ambulatory Visit: Payer: Self-pay | Admitting: Physician Assistant

## 2018-05-12 DIAGNOSIS — E038 Other specified hypothyroidism: Secondary | ICD-10-CM

## 2018-05-12 NOTE — Telephone Encounter (Signed)
Please call patient and schedule virtual/office visit with Luvenia Starch and have labs drawn for follow up prior to refills. Thanks!

## 2018-05-12 NOTE — Telephone Encounter (Signed)
Called PT to schedule. VM is full. Will call again on Monday

## 2018-05-19 ENCOUNTER — Encounter: Payer: Self-pay | Admitting: Physician Assistant

## 2018-05-22 ENCOUNTER — Ambulatory Visit (INDEPENDENT_AMBULATORY_CARE_PROVIDER_SITE_OTHER): Payer: 59 | Admitting: Sports Medicine

## 2018-05-22 ENCOUNTER — Encounter: Payer: Self-pay | Admitting: Sports Medicine

## 2018-05-22 DIAGNOSIS — M654 Radial styloid tenosynovitis [de Quervain]: Secondary | ICD-10-CM | POA: Diagnosis not present

## 2018-05-22 NOTE — Progress Notes (Signed)
Virtual Visit via WebEx/MyChart   I connected with  Jenna [name]  on 05/22/18 via WebEx/MyChart/Doximity Video and verified that I am speaking with the correct person using two identifiers.   I discussed the limitations, risks, security and privacy concerns of performing an evaluation and management service by WebEx/MyChart/Doximity Video, including the higher likelihood of inaccurate diagnosis and treatment, and the availability of in person appointments.  We also discussed the likely need of an additional face to face encounter for complete and high quality delivery of care.  I also discussed with the patient that there may be a patient responsible charge related to this service. The patient expressed understanding and wishes to proceed.  Provider location is either at home or medical facility. Patient location is at their home, different from provider location. People involved in care of the patient during this telehealth encounter were myself, my nurse/medical assistant, and my front office/scheduling team member.  Subjective:    CC: Follow-up  HPI: This is a pleasant 58 year old female, we treated her aggressively for a de Quervain's tenosynovitis with an injection, now she is pretty much pain-free.  Happy with how things are going, doing the rehab exercises.  She was going to come in with an in office visit but someone in her job tested positive for COVID-19, for this reason she is going to stay out of the office, but she does need a swab sometime soon to show that she is negative.  She is asymptomatic right now.  I reviewed the past medical history, family history, social history, surgical history, and allergies today and no changes were needed.  Please see the problem list section below in epic for further details.  Past Medical History: Past Medical History:  Diagnosis Date  . Anxiety   . Asthma   . Breast cancer of lower-outer quadrant of left female breast ([place]) 09/12/2015   . Complication of anesthesia    hard time waking up with gallbladder  . Depression   . GERD (gastroesophageal reflux disease)   . Hashimoto's disease   . History of external beam radiation therapy 03/10/16-04/23/16   left breast/50.4 Gy in 28 fractions, left breast boost/10 Gy in 5 fractions  . Hypothyroidism   . Irritable bowel   . Personal history of chemotherapy 2017-2018  . Personal history of radiation therapy 2018  . Uterine displacement    Past Surgical History: Past Surgical History:  Procedure Laterality Date  . BREAST BIOPSY Left 09/10/2015  . BREAST LUMPECTOMY Left 09/25/2015  . BREAST LUMPECTOMY WITH NEEDLE LOCALIZATION AND AXILLARY SENTINEL LYMPH NODE BX Left 09/25/2015   Procedure: BREAST LUMPECTOMY WITH NEEDLE LOCALIZATION X'S 2 AND AXILLARY SENTINEL LYMPH NODE BX;  Surgeon: Erroll Luna, MD;  Location: [place];  Service: General;  Laterality: Left;  . CHOLECYSTECTOMY OPEN    . disk fusion    . ENDOMETRIAL ABLATION    . MASTOPEXY Bilateral 10/25/2016   Procedure: BILATERAL MASTOPEXY;  Surgeon: Irene Limbo, MD;  Location: [place];  Service: Plastics;  Laterality: Bilateral;  . neck fusion    . PORT-A-CATH REMOVAL Right 10/25/2016   Procedure: RIGHT CHEST PORT REMOVAL;  Surgeon: Irene Limbo, MD;  Location: [place];  Service: Plastics;  Laterality: Right;  . PORTACATH PLACEMENT Right 09/25/2015   Procedure: INSERTION PORT-A-CATH;  Surgeon: Erroll Luna, MD;  Location: [place];  Service: General;  Laterality: Right;  . REDUCTION MAMMAPLASTY Bilateral 2018   Social History: Social History  Socioeconomic History  . Marital status: Married    Spouse name: Not on file  . Number of children: Not on file  . Years of education: Not on file  . Highest education level: Not on file  Occupational History  . Not on file  Social Needs  . Financial resource strain: Not on file  . Food  insecurity:    Worry: Not on file    Inability: Not on file  . Transportation needs:    Medical: Not on file    Non-medical: Not on file  Tobacco Use  . Smoking status: Former Research scientist (life sciences)  . Smokeless tobacco: Never Used  Substance and Sexual Activity  . Alcohol use: Yes    Alcohol/week: 1.0 standard drinks    Types: 1 Standard drinks or equivalent per week    Comment: social  . Drug use: No  . Sexual activity: Yes    Partners: Male  Lifestyle  . Physical activity:    Days per week: Not on file    Minutes per session: Not on file  . Stress: Not on file  Relationships  . Social connections:    Talks on phone: Not on file    Gets together: Not on file    Attends religious service: Not on file    Active member of club or organization: Not on file    Attends meetings of clubs or organizations: Not on file    Relationship status: Not on file  Other Topics Concern  . Not on file  Social History Narrative  . Not on file   Family History: Family History  Problem Relation Age of Onset  . Diabetes Mother   . Hypertension Mother   . Hyperlipidemia Mother   . Kidney disease Mother   . Cancer Maternal Grandfather    Allergies: Allergies  Allergen Reactions  . Latex Rash  . Sulfa Antibiotics Rash   Medications: See med rec.  Review of Systems: No fevers, chills, night sweats, weight loss, chest pain, or shortness of breath.   Objective:    General: Speaking full sentences, no audible heavy breathing.  Sounds alert and appropriately interactive.  Appears well.  Face symmetric.  Extraocular movements intact.  Pupils equal and round.  No nasal flaring or accessory muscle use visualized.  No other physical exam performed due to the non-physical nature of this visit.  Impression and Recommendations:    De Quervain's tenosynovitis, right Tendon sheath ganglion, we did a de Quervain's injection at the last visit and she is pain-free now. She is going to come back on Thursday for a  drive-by COVID swab for a different reason.   I discussed the above assessment and treatment plan with the patient. The patient was provided an opportunity to ask questions and all were answered. The patient agreed with the plan and demonstrated an understanding of the instructions.   The patient was advised to call back or seek an in-person evaluation if the symptoms worsen or if the condition fails to improve as anticipated.   I provided 25 minutes of non-face-to-face time during this encounter, 15 minutes of additional time was needed to gather information, review chart, records, communicate/coordinate with staff remotely, troubleshooting the multiple errors that we get every time when trying to do video calls through the electronic medical record, WebEx, and Doximity, restart the encounter multiple times due to instability of the software, as well as complete documentation.   ___________________________________________ Gwen Her. Dianah Field, M.D., ABFM., CAQSM. Primary Care and Sports  Medicine [facility]  Adjunct Professor of [employer] [facility] of Medicine

## 2018-05-22 NOTE — Assessment & Plan Note (Signed)
Tendon sheath ganglion, we did a de Quervain's injection at the last visit and she is pain-free now. She is going to come back on Thursday for a drive-by COVID swab for a different reason.

## 2018-05-23 NOTE — Telephone Encounter (Signed)
Please reschedule her drive-by COVID test for tomorrow.  She is currently scheduled for Thursday.

## 2018-05-24 ENCOUNTER — Ambulatory Visit (INDEPENDENT_AMBULATORY_CARE_PROVIDER_SITE_OTHER): Payer: 59 | Admitting: Sports Medicine

## 2018-05-24 DIAGNOSIS — Z20828 Contact with and (suspected) exposure to other viral communicable diseases: Secondary | ICD-10-CM

## 2018-05-24 DIAGNOSIS — Z20822 Contact with and (suspected) exposure to covid-19: Secondary | ICD-10-CM | POA: Insufficient documentation

## 2018-05-24 NOTE — Assessment & Plan Note (Signed)
Drive-by swab. Quarantine for 14 days or until results are negative.

## 2018-05-24 NOTE — Progress Notes (Signed)
Subjective:    CC: [place] symptoms  HPI: This is a pleasant 58 year old female, someone in her workplace recently tested positive for COVID-19, she has had some mild symptoms such as runny nose, sore throat.  She is here for a drive-by swab.  I reviewed the past medical history, family history, social history, surgical history, and allergies today and no changes were needed.  Please see the problem list section below in epic for further details.  Past Medical History: Past Medical History:  Diagnosis Date  . Anxiety   . Asthma   . Breast cancer of lower-outer quadrant of left female breast ([place]) 09/12/2015  . Complication of anesthesia    hard time waking up with gallbladder  . Depression   . GERD (gastroesophageal reflux disease)   . Hashimoto's disease   . History of external beam radiation therapy 03/10/16-04/23/16   left breast/50.4 Gy in 28 fractions, left breast boost/10 Gy in 5 fractions  . Hypothyroidism   . Irritable bowel   . Personal history of chemotherapy 2017-2018  . Personal history of radiation therapy 2018  . Uterine displacement    Past Surgical History: Past Surgical History:  Procedure Laterality Date  . BREAST BIOPSY Left 09/10/2015  . BREAST LUMPECTOMY Left 09/25/2015  . BREAST LUMPECTOMY WITH NEEDLE LOCALIZATION AND AXILLARY SENTINEL LYMPH NODE BX Left 09/25/2015   Procedure: BREAST LUMPECTOMY WITH NEEDLE LOCALIZATION X'S 2 AND AXILLARY SENTINEL LYMPH NODE BX;  Surgeon: Erroll Luna, MD;  Location: [place];  Service: General;  Laterality: Left;  . CHOLECYSTECTOMY OPEN    . disk fusion    . ENDOMETRIAL ABLATION    . MASTOPEXY Bilateral 10/25/2016   Procedure: BILATERAL MASTOPEXY;  Surgeon: Irene Limbo, MD;  Location: [place];  Service: Plastics;  Laterality: Bilateral;  . neck fusion    . PORT-A-CATH REMOVAL Right 10/25/2016   Procedure: RIGHT CHEST PORT REMOVAL;  Surgeon: Irene Limbo, MD;  Location:  [place];  Service: Plastics;  Laterality: Right;  . PORTACATH PLACEMENT Right 09/25/2015   Procedure: INSERTION PORT-A-CATH;  Surgeon: Erroll Luna, MD;  Location: [place];  Service: General;  Laterality: Right;  . REDUCTION MAMMAPLASTY Bilateral 2018   Social History: Social History   Socioeconomic History  . Marital status: Married    Spouse name: Not on file  . Number of children: Not on file  . Years of education: Not on file  . Highest education level: Not on file  Occupational History  . Not on file  Social Needs  . Financial resource strain: Not on file  . Food insecurity:    Worry: Not on file    Inability: Not on file  . Transportation needs:    Medical: Not on file    Non-medical: Not on file  Tobacco Use  . Smoking status: Former Research scientist (life sciences)  . Smokeless tobacco: Never Used  Substance and Sexual Activity  . Alcohol use: Yes    Alcohol/week: 1.0 standard drinks    Types: 1 Standard drinks or equivalent per week    Comment: social  . Drug use: No  . Sexual activity: Yes    Partners: Male  Lifestyle  . Physical activity:    Days per week: Not on file    Minutes per session: Not on file  . Stress: Not on file  Relationships  . Social connections:    Talks on phone: Not on file    Gets together: Not on file  Attends religious service: Not on file    Active member of club or organization: Not on file    Attends meetings of clubs or organizations: Not on file    Relationship status: Not on file  Other Topics Concern  . Not on file  Social History Narrative  . Not on file   Family History: Family History  Problem Relation Age of Onset  . Diabetes Mother   . Hypertension Mother   . Hyperlipidemia Mother   . Kidney disease Mother   . Cancer Maternal Grandfather    Allergies: Allergies  Allergen Reactions  . Latex Rash  . Sulfa Antibiotics Rash   Medications: See med rec.  Review of Systems: No fevers, chills,  night sweats, weight loss, chest pain, or shortness of breath.   Objective:    General: Well Developed, well nourished, and in no acute distress.  Neuro: Alert and oriented x3, extra-ocular muscles intact, sensation grossly intact.  HEENT: Normocephalic, atraumatic, pupils equal round reactive to light, neck supple, no masses, no lymphadenopathy, thyroid nonpalpable.  Skin: Warm and dry, no rashes. Cardiac: Regular rate and rhythm, no murmurs rubs or gallops, no lower extremity edema.  Respiratory: Clear to auscultation bilaterally. Not using accessory muscles, speaking in full sentences.  I applied the appropriate PPE, and then performed bilateral nasopharyngeal swabs.  Impression and Recommendations:    Exposure to Covid-19 Virus Drive-by swab. Quarantine for 14 days or until results are negative.   ___________________________________________ Gwen Her. Dianah Field, M.D., ABFM., CAQSM. Primary Care and Sports Medicine [facility]  Adjunct Professor of [employer] [facility] [employer] Medicine

## 2018-05-25 ENCOUNTER — Ambulatory Visit: Payer: 59 | Admitting: Sports Medicine

## 2018-05-26 LAB — SARS-COV-2 RNA,(COVID-19) QUALITATIVE NAAT: SARS CoV2 RNA: NOT DETECTED

## 2018-06-05 ENCOUNTER — Other Ambulatory Visit: Payer: Self-pay | Admitting: Physician Assistant

## 2018-06-05 DIAGNOSIS — E063 Autoimmune thyroiditis: Secondary | ICD-10-CM

## 2018-06-05 DIAGNOSIS — E038 Other specified hypothyroidism: Secondary | ICD-10-CM

## 2018-06-12 ENCOUNTER — Other Ambulatory Visit: Payer: Self-pay | Admitting: Neurology

## 2018-06-12 DIAGNOSIS — E038 Other specified hypothyroidism: Secondary | ICD-10-CM

## 2018-06-12 MED ORDER — LIOTHYRONINE SODIUM 50 MCG PO TABS: ORAL_TABLET | ORAL | 0 refills | Status: DC

## 2018-08-02 ENCOUNTER — Other Ambulatory Visit: Payer: Self-pay | Admitting: Hematology and Oncology

## 2018-08-02 DIAGNOSIS — Z1231 Encounter for screening mammogram for malignant neoplasm of breast: Secondary | ICD-10-CM

## 2018-08-23 ENCOUNTER — Encounter: Payer: Self-pay | Admitting: Hematology and Oncology

## 2018-09-05 ENCOUNTER — Encounter: Payer: Self-pay | Admitting: Physician Assistant

## 2018-09-06 ENCOUNTER — Ambulatory Visit (INDEPENDENT_AMBULATORY_CARE_PROVIDER_SITE_OTHER): Payer: 59 | Admitting: Physician Assistant

## 2018-09-06 ENCOUNTER — Other Ambulatory Visit: Payer: Self-pay

## 2018-09-06 DIAGNOSIS — Z23 Encounter for immunization: Secondary | ICD-10-CM | POA: Diagnosis not present

## 2018-09-06 NOTE — Progress Notes (Signed)
Patient was in office for influenza and Shingles vaccine. Patient tolerated both injections and was told to return in 2-6 months for 2nd vaccine. Annalyn Blecher,CMA

## 2018-09-20 ENCOUNTER — Other Ambulatory Visit: Payer: Self-pay | Admitting: Diagnostic Radiology

## 2018-09-20 ENCOUNTER — Other Ambulatory Visit: Payer: Self-pay | Admitting: Hematology and Oncology

## 2018-09-20 ENCOUNTER — Ambulatory Visit
Admission: RE | Admit: 2018-09-20 | Discharge: 2018-09-20 | Disposition: A | Discharge status: Home / Self Care | Payer: 59 | Source: Ambulatory Visit | Attending: Hematology and Oncology | Admitting: Hematology and Oncology

## 2018-09-20 ENCOUNTER — Other Ambulatory Visit: Payer: Self-pay

## 2018-09-20 DIAGNOSIS — Z853 Personal history of malignant neoplasm of breast: Secondary | ICD-10-CM

## 2018-09-20 DIAGNOSIS — Z1231 Encounter for screening mammogram for malignant neoplasm of breast: Secondary | ICD-10-CM

## 2018-09-20 DIAGNOSIS — N6320 Unspecified lump in the left breast, unspecified quadrant: Secondary | ICD-10-CM

## 2018-12-07 ENCOUNTER — Other Ambulatory Visit: Payer: Self-pay | Admitting: Hematology and Oncology

## 2018-12-07 ENCOUNTER — Encounter: Payer: Self-pay | Admitting: Physician Assistant

## 2018-12-07 DIAGNOSIS — E063 Autoimmune thyroiditis: Secondary | ICD-10-CM

## 2018-12-08 ENCOUNTER — Encounter: Payer: Self-pay | Admitting: Physician Assistant

## 2018-12-08 ENCOUNTER — Other Ambulatory Visit: Payer: Self-pay | Admitting: Hematology and Oncology

## 2018-12-08 DIAGNOSIS — E063 Autoimmune thyroiditis: Secondary | ICD-10-CM | POA: Diagnosis not present

## 2018-12-09 LAB — TSH: TSH: 6.24 mIU/L — ABNORMAL HIGH (ref 0.40–4.50)

## 2018-12-11 NOTE — Telephone Encounter (Signed)
TSH is elevated meaning a more hypothyroid state. Will discuss at appt on the 8th.

## 2018-12-12 ENCOUNTER — Encounter: Payer: Self-pay | Admitting: Physician Assistant

## 2018-12-12 ENCOUNTER — Telehealth (INDEPENDENT_AMBULATORY_CARE_PROVIDER_SITE_OTHER): Payer: BC Managed Care – PPO | Admitting: Physician Assistant

## 2018-12-12 VITALS — Temp 98.7°F | Ht 62.0 in | Wt 175.0 lb

## 2018-12-12 DIAGNOSIS — K219 Gastro-esophageal reflux disease without esophagitis: Secondary | ICD-10-CM | POA: Diagnosis not present

## 2018-12-12 DIAGNOSIS — F410 Panic disorder [episodic paroxysmal anxiety] without agoraphobia: Secondary | ICD-10-CM | POA: Insufficient documentation

## 2018-12-12 DIAGNOSIS — E063 Autoimmune thyroiditis: Secondary | ICD-10-CM

## 2018-12-12 DIAGNOSIS — R232 Flushing: Secondary | ICD-10-CM

## 2018-12-12 DIAGNOSIS — F329 Major depressive disorder, single episode, unspecified: Secondary | ICD-10-CM

## 2018-12-12 DIAGNOSIS — E038 Other specified hypothyroidism: Secondary | ICD-10-CM | POA: Diagnosis not present

## 2018-12-12 DIAGNOSIS — H9202 Otalgia, left ear: Secondary | ICD-10-CM

## 2018-12-12 DIAGNOSIS — F419 Anxiety disorder, unspecified: Secondary | ICD-10-CM

## 2018-12-12 DIAGNOSIS — F32A Depression, unspecified: Secondary | ICD-10-CM

## 2018-12-12 MED ORDER — DULOXETINE HCL 60 MG PO CPEP: 60.0000 mg | ORAL_CAPSULE | Freq: Every day | ORAL | 3 refills | Status: DC

## 2018-12-12 MED ORDER — METHYLPREDNISOLONE 4 MG PO TBPK: ORAL_TABLET | ORAL | 0 refills | Status: DC

## 2018-12-12 MED ORDER — OMEPRAZOLE 20 MG PO CPDR: 20.0000 mg | DELAYED_RELEASE_CAPSULE | Freq: Two times a day (BID) | ORAL | 3 refills | Status: DC

## 2018-12-12 MED ORDER — LIOTHYRONINE SODIUM 50 MCG PO TABS: 50.0000 ug | ORAL_TABLET | Freq: Every day | ORAL | 1 refills | Status: DC

## 2018-12-12 NOTE — Progress Notes (Signed)
Patient ID: Jenna [name], female   DOB: 1960-04-11, 58 y.o.   MRN: [id number] .Marland KitchenVirtual Visit via Video Note  I connected with Rennie Natter on 12/12/18 at  3:00 PM EST by a video enabled telemedicine application and verified that I am speaking with the correct person using two identifiers.  Location: Patient: home Provider: clinic   I discussed the limitations of evaluation and management by telemedicine and the availability of in person appointments. The patient expressed understanding and agreed to proceed.  History of Present Illness: Patient is a 58 year old female with hypothyroidism, GERD, anxiety and depression who presents to the clinic for medication refill.  She admits she has been out of medication for the last 2 weeks.  She admits she has not been doing well seemingly for the last 2 weeks.  She complains of waking up in the middle the night having trouble breathing.  She wakes up and takes some good deep breaths and then lays back down.  She is very anxious and tearful.  She will cry even at a [employer] commercial.  She is having lots of hot flashes and sweating.  She is having some left ear pain and clogging.  She denies any ear discharge.  She denies any sinus pressure, upper respiratory symptoms.      .. Active Ambulatory Problems    Diagnosis Date Noted  . Hashimoto's thyroiditis 02/25/2014  . GERD (gastroesophageal reflux disease) 02/25/2014  . Allergic rhinitis 02/25/2014  . Uterine fibroid 02/25/2014  . Mixed hyperlipidemia 02/25/2014  . Multiple joint pain 02/25/2014  . Postmenopausal 10/29/2014  . Insomnia 12/11/2014  . Irritability and anger 12/11/2014  . Anxiety and depression 12/11/2014  . Hypertriglyceridemia 08/13/2015  . Constipation 08/15/2015  . Family history of systemic lupus erythematosus 08/15/2015  . Bloating 08/15/2015  . Multiple food allergies 08/19/2015  . Left breast mass 09/02/2015  . Breast cancer of lower-outer quadrant of left  female breast ([place]) 09/12/2015  . Malignant neoplasm of lower-outer quadrant of left breast of female, estrogen receptor positive ([place]) 10/13/2015  . Antineoplastic chemotherapy induced anemia 12/12/2015  . Chemotherapy-induced thrombocytopenia 01/23/2016  . Current moderate episode of major depressive disorder without prior episode ([place]) 07/16/2016  . Hyperkalemia 07/16/2016  . Breast tenderness 07/16/2016  . Port catheter in place 07/30/2016  . Arthralgia of left temporomandibular joint 06/13/2017  . De Quervain's tenosynovitis, right 04/24/2018  . Exposure to COVID-19 virus 05/24/2018  . Left ear pain 12/12/2018  . Hot flashes 12/12/2018  . Panic attacks 12/12/2018   Resolved Ambulatory Problems    Diagnosis Date Noted  . No Resolved Ambulatory Problems   Past Medical History:  Diagnosis Date  . Anxiety   . Asthma   . Complication of anesthesia   . Depression   . Hashimoto's disease   . History of external beam radiation therapy 03/10/16-04/23/16  . Hypothyroidism   . Irritable bowel   . Personal history of chemotherapy 2017-2018  . Personal history of radiation therapy 2018  . Uterine displacement    Reviewed med, allergy, problem list.   Observations/Objective: No acute distress.  Normal breathing.  Mood was anxious.   .. Today's Vitals   12/12/18 1430  Temp: 98.7 F (37.1 C)  TempSrc: Oral  Weight: 175 lb (79.4 kg)  Height: 5\' 2"  (1.575 m)   Body mass index is 32.01 kg/m.  .. Depression screen [facility] 2/9 12/12/2018 06/10/2017 07/16/2016 06/03/2016  Decreased Interest 2 1 2  0  Down, Depressed, Hopeless 3 2 2  0  PHQ - 2 Score 5 3 4  0  Altered sleeping 3 3 2  -  Tired, decreased energy 3 1 2  -  Change in appetite 2 0 3 -  Feeling bad or failure about yourself  2 2 2  -  Trouble concentrating 3 1 1  -  Moving slowly or fidgety/restless 2 1 2  -  Suicidal thoughts 0 0 0 -  PHQ-9 Score 20 11 16  -  Difficult doing work/chores Somewhat difficult - - -   .Marland Kitchen GAD 7  : Generalized Anxiety Score 12/12/2018 06/10/2017  Nervous, Anxious, on Edge 3 1  Control/stop worrying 3 1  Worry too much - different things 3 1  Trouble relaxing 3 2  Restless 1 1  Easily annoyed or irritable 3 2  Afraid - awful might happen 2 2  Total GAD 7 Score 18 10  Anxiety Difficulty Not difficult at all Somewhat difficult       Assessment and Plan: Marland KitchenMarland KitchenCarolann was seen today for annual exam.  Diagnoses and all orders for this visit:  Left ear pain -     methylPREDNISolone (MEDROL DOSEPAK) 4 MG TBPK tablet; Take as directed by package insert.  Hypothyroidism due to Hashimoto's thyroiditis -     liothyronine (CYTOMEL) 50 MCG tablet; Take 1 tablet (50 mcg total) by mouth daily.  Hashimoto's thyroiditis -     liothyronine (CYTOMEL) 50 MCG tablet; Take 1 tablet (50 mcg total) by mouth daily.  Gastroesophageal reflux disease, unspecified whether esophagitis present -     omeprazole (PRILOSEC) 20 MG capsule; Take 1 capsule (20 mg total) by mouth 2 (two) times daily before a meal.  Anxiety and depression -     DULoxetine (CYMBALTA) 60 MG capsule; Take 1 capsule (60 mg total) by mouth daily.  Hot flashes  Panic attacks -     DULoxetine (CYMBALTA) 60 MG capsule; Take 1 capsule (60 mg total) by mouth daily.   PHQ and GAD-7 were very elevated and out of control.  Patient is having panic attacks, hot flashes, worsening anxiety/mood, left ear pain.  Patient has been out of her medication for about 2 weeks now.  She has noticed most of the symptoms have happened in the last 2 weeks.  Her thyroid was recently checked and showed hypothyroidism.  I suggested for her to get back on all her medications and in 4 to 6 weeks we could reassess.  We could also recheck her labs.  I do think that most of her symptoms are due to the sudden withdrawal of medication.  Certainly if symptoms are worsening or not improving please follow-up.  I did go ahead and give her Medrol Dosepak for her left ear  pain.  Her symptoms sound consistent with some eustachian tube dysfunction.  This is really bothering her.  She feels like there is a lot of pressure in her ear.  She is already tried cerumen removal drops.  Follow-up in office if not improving.   Follow Up Instructions:    I discussed the assessment and treatment plan with the patient. The patient was provided an opportunity to ask questions and all were answered. The patient agreed with the plan and demonstrated an understanding of the instructions.   The patient was advised to call back or seek an in-person evaluation if the symptoms worsen or if the condition fails to improve as anticipated.    Iran Planas, PA-C

## 2018-12-12 NOTE — Progress Notes (Deleted)
Needs refills of medications.  Has been off thyroid medication - 2 months, Cymbalta - 2 weeks.  PHQ9-GAD7 completed.

## 2018-12-29 ENCOUNTER — Encounter: Payer: Self-pay | Admitting: Physician Assistant

## 2018-12-29 DIAGNOSIS — E6609 Other obesity due to excess calories: Secondary | ICD-10-CM

## 2018-12-29 DIAGNOSIS — Z6830 Body mass index (BMI) 30.0-30.9, adult: Secondary | ICD-10-CM

## 2018-12-29 DIAGNOSIS — L989 Disorder of the skin and subcutaneous tissue, unspecified: Secondary | ICD-10-CM

## 2019-01-10 DIAGNOSIS — D22111 Melanocytic nevi of right upper eyelid, including canthus: Secondary | ICD-10-CM | POA: Diagnosis not present

## 2019-01-10 DIAGNOSIS — L578 Other skin changes due to chronic exposure to nonionizing radiation: Secondary | ICD-10-CM | POA: Diagnosis not present

## 2019-01-17 IMAGING — CT CT ABD-PELV W/ CM
2 of 5 series · 13 of 36 positions shown, 16 images · IV contrast (ISOVUE 300)
Comparison: None.

CLINICAL DATA: Left breast cancer with left lumpectomy and
reconstruction. Chemotherapy and radiation therapy completed.
Ongoing oral medication. Right forearm pain.

EXAM:
CT CHEST, ABDOMEN, AND PELVIS WITH CONTRAST
TECHNIQUE: Multidetector CT imaging of the chest, abdomen and pelvis was
performed following the standard protocol during bolus
administration of intravenous contrast.
CONTRAST:  100mL [id number] IOPAMIDOL ([id number]) INJECTION 61%

[Series 2: cap with · axial · 0.87mm/px · z∈[+1167,+1662]mm · 10 of 121 slices shown, 13 images]
[im 11/121  mediastinal]
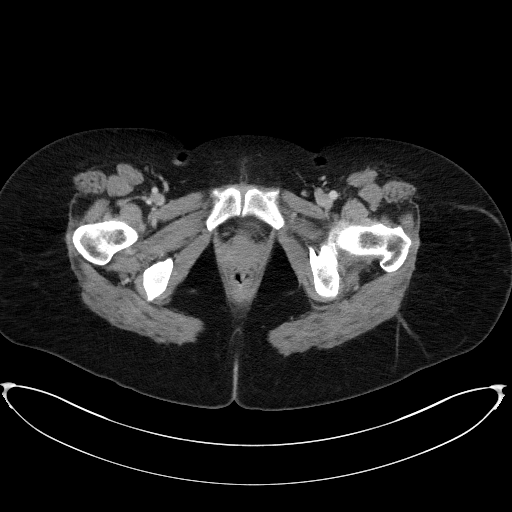
[im 11/121  lung]
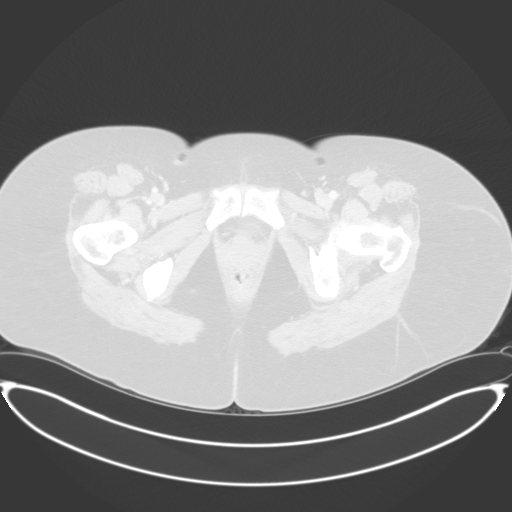
[im 22/121  lung]
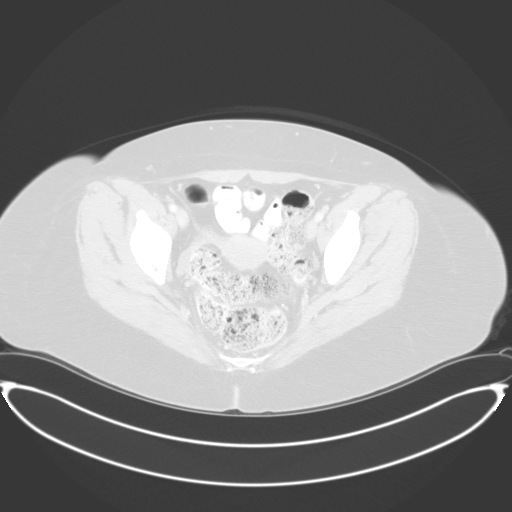
[im 33/121  lung]
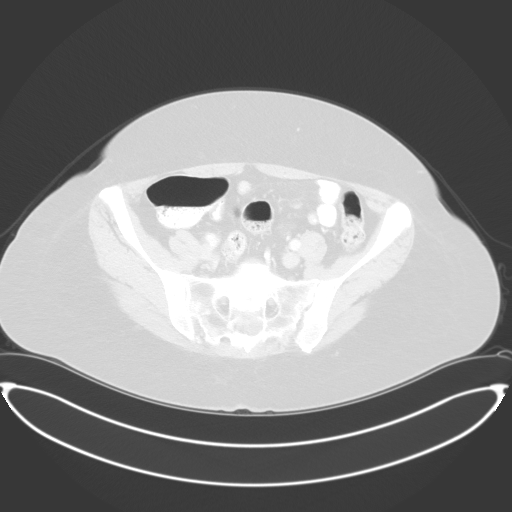
[im 44/121  lung]
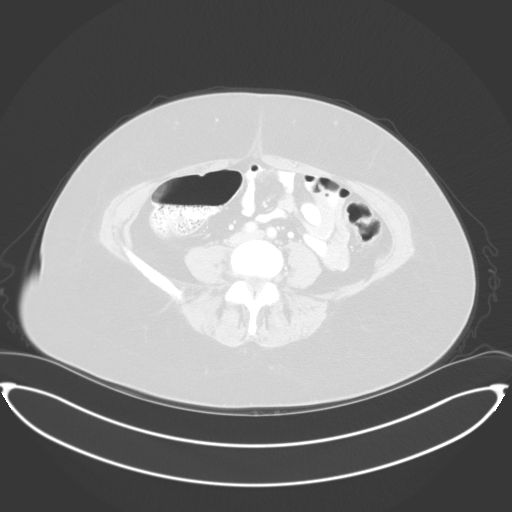
[im 55/121  mediastinal]
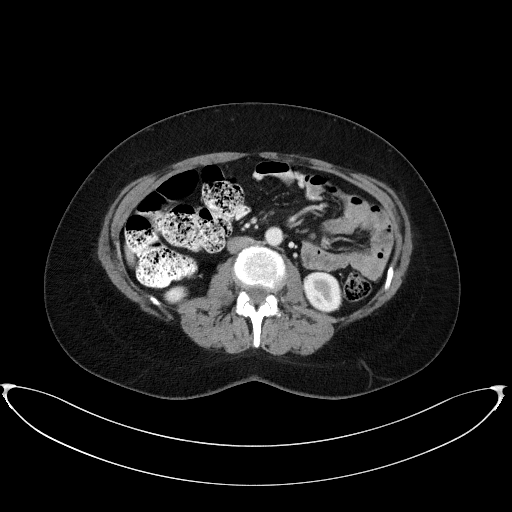
[im 55/121  lung]
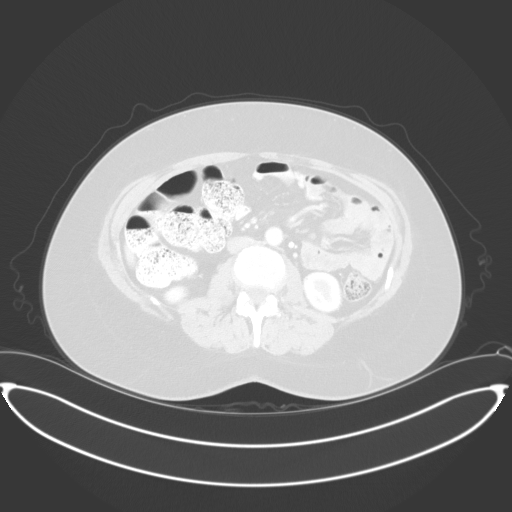
[im 66/121  lung]
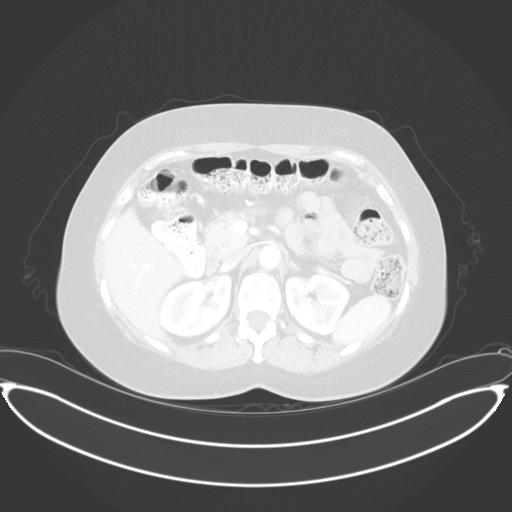
[im 77/121  lung]
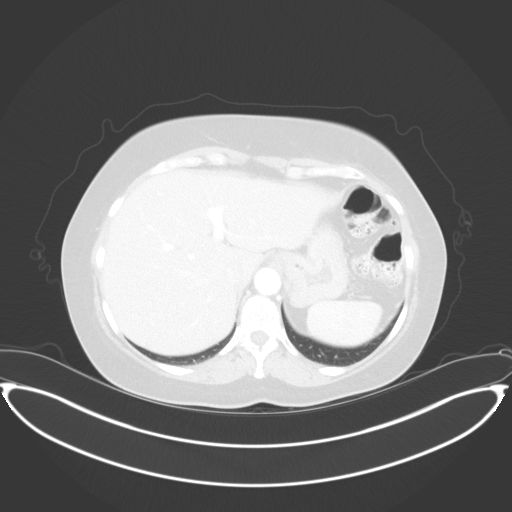
[im 88/121  lung]
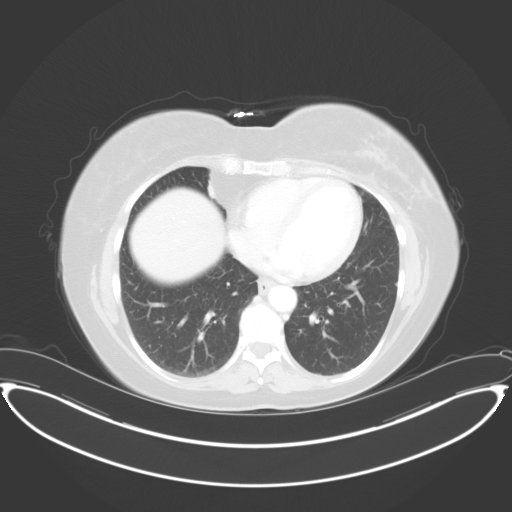
[im 99/121  mediastinal]
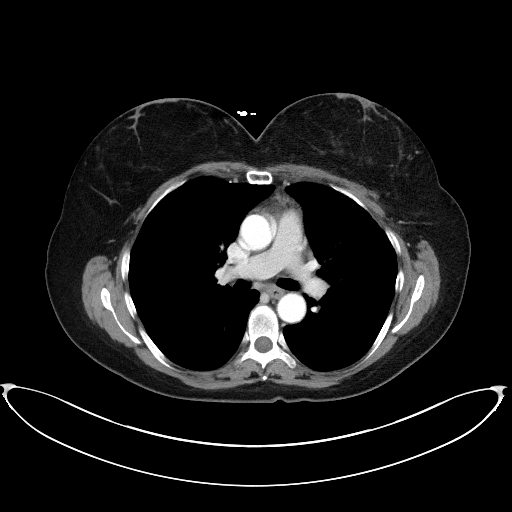
[im 99/121  lung]
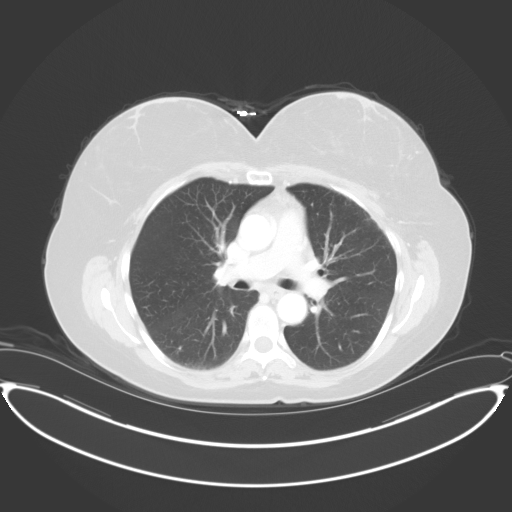
[im 110/121  lung]
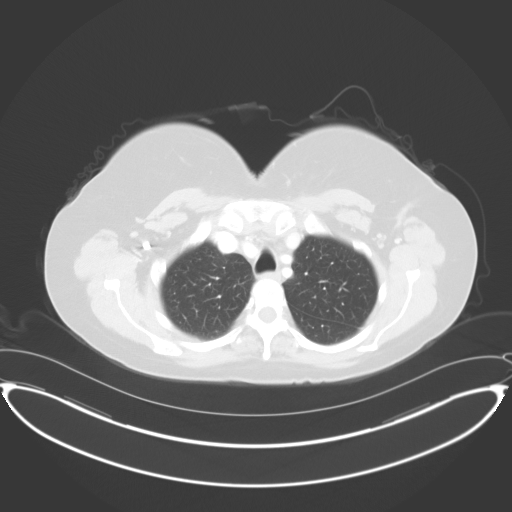

[Series 5: coronals · coronal · 0.82mm/px · 3 of 136 slices shown]
[im 28/136  lung]
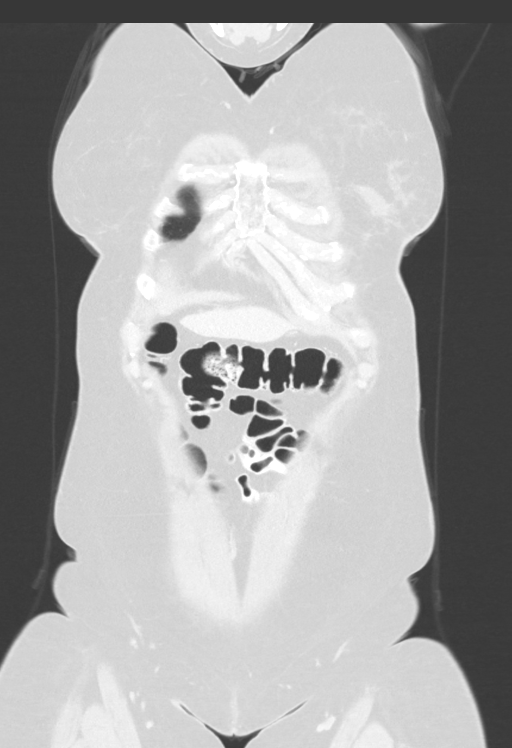
[im 55/136  lung]
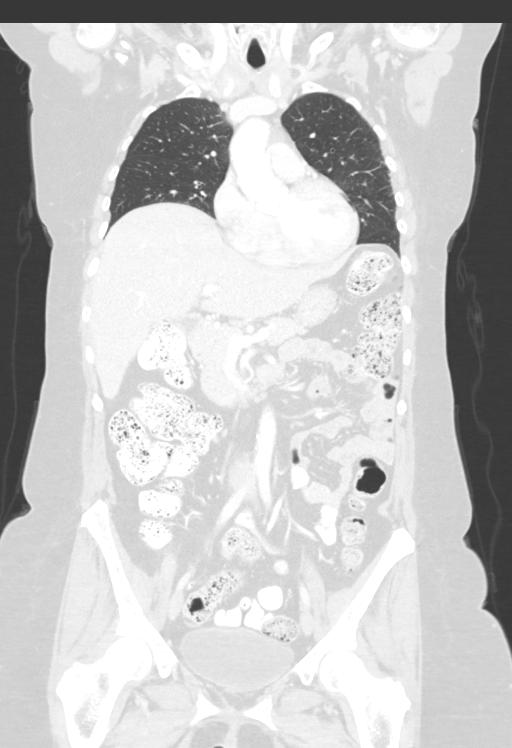
[im 82/136  lung]
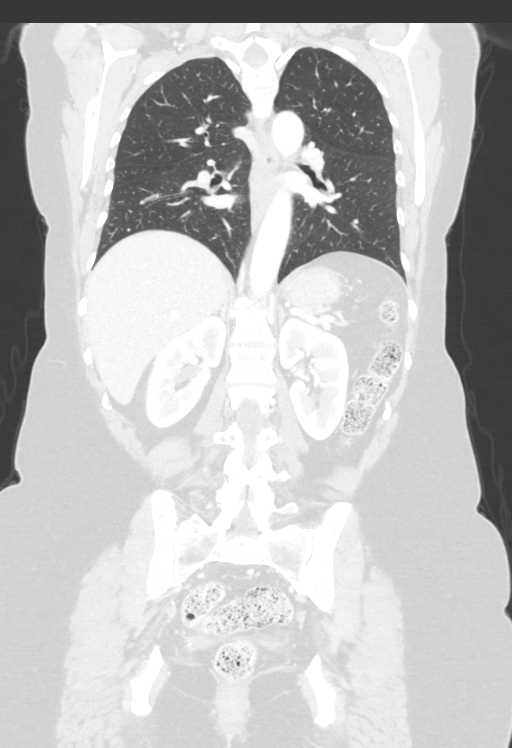

[13 of 36 positions shown; findings below may reference images not displayed]

FINDINGS: CT CHEST FINDINGS

Cardiovascular: Heart is at the upper limits of normal to mildly
enlarged. Left ventricle appears dilated. No pericardial effusion.

Mediastinum/Nodes: No pathologically enlarged mediastinal, hilar,
internal mammary or axillary lymph nodes. Surgical clips in the left
axilla. Esophagus is grossly unremarkable.

Lungs/Pleura: 4 mm subpleural left lower lobe nodule (series 4,
image 83), nonspecific. Mild subpleural radiation fibrosis in the
left upper lobe. Lungs are otherwise clear. Airway is unremarkable.

Musculoskeletal: No worrisome lytic or sclerotic lesions. A 1.7 x
3.2 cm fluid density lesion with adjacent surgical clips in the deep
soft tissues of the left breast likely represents a postoperative
seroma.

CT ABDOMEN PELVIS FINDINGS

Hepatobiliary: Liver is unremarkable. Gallbladder is absent. No
biliary ductal dilatation.

Pancreas: Negative.

Spleen: Negative.

Adrenals/Urinary Tract: Adrenal glands and kidneys are unremarkable.
Ureters are decompressed. Bladder is grossly unremarkable.

Stomach/Bowel: Stomach, small bowel, appendix and colon are
unremarkable.

Vascular/Lymphatic: Mild atherosclerotic calcification of the
arterial vasculature without abdominal aortic aneurysm. No
pathologically enlarged lymph nodes.

Reproductive: Uterus is visualized.  No adnexal mass.

Other: No free fluid.  Mesenteries and peritoneum are unremarkable.

Musculoskeletal: No worrisome lytic or sclerotic lesions.
Degenerative changes in the spine.
IMPRESSION: 1. No evidence of metastatic disease in the chest, abdomen or
pelvis.
2. 4 mm subpleural left lower lobe nodule, non-specific and unlikely
to be metastatic in origin.
3. Probable postoperative seroma in the left breast.
4.  Aortic atherosclerosis (QA2SE-170.0).

## 2019-01-24 ENCOUNTER — Encounter: Payer: Self-pay | Admitting: Physician Assistant

## 2019-03-20 ENCOUNTER — Encounter: Payer: Self-pay | Admitting: Physician Assistant

## 2019-03-21 ENCOUNTER — Other Ambulatory Visit: Payer: Self-pay

## 2019-03-21 ENCOUNTER — Telehealth: Payer: Self-pay | Admitting: Physician Assistant

## 2019-03-21 DIAGNOSIS — Z780 Asymptomatic menopausal state: Secondary | ICD-10-CM

## 2019-03-21 DIAGNOSIS — Z1211 Encounter for screening for malignant neoplasm of colon: Secondary | ICD-10-CM

## 2019-03-21 DIAGNOSIS — Z1231 Encounter for screening mammogram for malignant neoplasm of breast: Secondary | ICD-10-CM

## 2019-03-21 NOTE — Telephone Encounter (Signed)
Left VM to schedule PT.

## 2019-03-21 NOTE — Telephone Encounter (Signed)
PT requested a referral for a colonoscopy. Declined office appointment.   Please Advise.

## 2019-03-21 NOTE — Addendum Note (Signed)
Addended byAnnamaria Helling on: 03/21/2019 09:42 AM   Modules accepted: Orders

## 2019-03-21 NOTE — Telephone Encounter (Signed)
Colonoscopy order placed but patient overdue for office appointment. She really needs to schedule follow up.

## 2019-03-21 NOTE — Telephone Encounter (Signed)
Signed. Agree with need for appt.

## 2019-03-23 ENCOUNTER — Ambulatory Visit: Payer: BC Managed Care – PPO | Admitting: Physician Assistant

## 2019-03-26 ENCOUNTER — Ambulatory Visit (INDEPENDENT_AMBULATORY_CARE_PROVIDER_SITE_OTHER): Payer: Self-pay | Admitting: Gastroenterology

## 2019-03-26 VITALS — Ht 62.0 in

## 2019-03-26 DIAGNOSIS — Z1211 Encounter for screening for malignant neoplasm of colon: Secondary | ICD-10-CM

## 2019-03-26 NOTE — Progress Notes (Signed)
Gastroenterology Pre-Procedure Review  Request Date: Friday 04/13/19 Requesting Physician: Dr. Vicente Males  PATIENT REVIEW QUESTIONS: The patient responded to the following health history questions as indicated:    1. Are you having any GI issues? no 2. Do you have a personal history of Polyps? no 3. Do you have a family history of Colon Cancer or Polyps? no 4. Diabetes Mellitus? no 5. Joint replacements in the past 12 months?no 6. Major health problems in the past 3 months?no 7. Any artificial heart valves, MVP, or defibrillator?no    MEDICATIONS & ALLERGIES:    Patient reports the following regarding taking any anticoagulation/antiplatelet therapy:   Plavix, Coumadin, Eliquis, Xarelto, Lovenox, Pradaxa, Brilinta, or Effient? no Aspirin? no  Patient confirms/reports the following medications:  Current Outpatient Medications  Medication Sig Dispense Refill  . anastrozole (ARIMIDEX) 1 MG tablet Take 1 tablet (1 mg total) by mouth daily. 90 tablet 3  . Calcium Carbonate-Vit D-Min (CALCIUM 1200 PO) Take by mouth.    . Cholecalciferol (VITAMIN D3) 1000 units CAPS Take by mouth.    . DULoxetine (CYMBALTA) 60 MG capsule Take 1 capsule (60 mg total) by mouth daily. 90 capsule 3  . liothyronine (CYTOMEL) 50 MCG tablet Take 1 tablet (50 mcg total) by mouth daily. 90 tablet 1  . Multiple Vitamin (MULTIVITAMIN) tablet Take 1 tablet by mouth daily.    . Multiple Vitamins-Minerals (OPTIC-VITES PO) Take by mouth.    Marland Kitchen omeprazole (PRILOSEC) 20 MG capsule Take 1 capsule (20 mg total) by mouth 2 (two) times daily before a meal. 180 capsule 3   No current facility-administered medications for this visit.    Patient confirms/reports the following allergies:  Allergies  Allergen Reactions  . Latex Rash  . Sulfa Antibiotics Rash    No orders of the defined types were placed in this encounter.   AUTHORIZATION INFORMATION Primary Insurance: 1D#: Group #:  Secondary Insurance: 1D#: Group  #:  SCHEDULE INFORMATION: Date:  Time: Location:

## 2019-04-04 ENCOUNTER — Ambulatory Visit (INDEPENDENT_AMBULATORY_CARE_PROVIDER_SITE_OTHER): Payer: BC Managed Care – PPO

## 2019-04-04 ENCOUNTER — Other Ambulatory Visit: Payer: Self-pay

## 2019-04-04 DIAGNOSIS — Z780 Asymptomatic menopausal state: Secondary | ICD-10-CM

## 2019-04-04 DIAGNOSIS — M85851 Other specified disorders of bone density and structure, right thigh: Secondary | ICD-10-CM | POA: Diagnosis not present

## 2019-04-09 ENCOUNTER — Encounter: Payer: Self-pay | Admitting: Physician Assistant

## 2019-04-09 DIAGNOSIS — M8580 Other specified disorders of bone density and structure, unspecified site: Secondary | ICD-10-CM | POA: Insufficient documentation

## 2019-04-11 ENCOUNTER — Other Ambulatory Visit: Payer: Self-pay

## 2019-04-11 ENCOUNTER — Other Ambulatory Visit
Admission: RE | Admit: 2019-04-11 | Discharge: 2019-04-11 | Disposition: A | Discharge status: Home / Self Care | Payer: BC Managed Care – PPO | Source: Ambulatory Visit | Attending: Gastroenterology | Admitting: Gastroenterology

## 2019-04-11 DIAGNOSIS — Z01812 Encounter for preprocedural laboratory examination: Secondary | ICD-10-CM | POA: Insufficient documentation

## 2019-04-11 DIAGNOSIS — Z20822 Contact with and (suspected) exposure to covid-19: Secondary | ICD-10-CM | POA: Diagnosis not present

## 2019-04-11 LAB — SARS CORONAVIRUS 2 (TAT 6-24 HRS): SARS Coronavirus 2: NEGATIVE

## 2019-04-13 ENCOUNTER — Encounter: Payer: Self-pay | Admitting: Gastroenterology

## 2019-04-13 ENCOUNTER — Ambulatory Visit: Payer: BC Managed Care – PPO | Admitting: Certified Registered Nurse Anesthetist

## 2019-04-13 ENCOUNTER — Encounter
Admission: RE | Disposition: A | Discharge status: Home / Self Care | Payer: Self-pay | Source: Home / Self Care | Attending: Gastroenterology

## 2019-04-13 ENCOUNTER — Ambulatory Visit
Admission: RE | Admit: 2019-04-13 | Discharge: 2019-04-13 | Disposition: A | Discharge status: Home / Self Care | Payer: BC Managed Care – PPO | Attending: Gastroenterology | Admitting: Gastroenterology

## 2019-04-13 DIAGNOSIS — Z853 Personal history of malignant neoplasm of breast: Secondary | ICD-10-CM | POA: Diagnosis not present

## 2019-04-13 DIAGNOSIS — Z9221 Personal history of antineoplastic chemotherapy: Secondary | ICD-10-CM | POA: Insufficient documentation

## 2019-04-13 DIAGNOSIS — E063 Autoimmune thyroiditis: Secondary | ICD-10-CM | POA: Insufficient documentation

## 2019-04-13 DIAGNOSIS — J45909 Unspecified asthma, uncomplicated: Secondary | ICD-10-CM | POA: Insufficient documentation

## 2019-04-13 DIAGNOSIS — Z1211 Encounter for screening for malignant neoplasm of colon: Secondary | ICD-10-CM | POA: Insufficient documentation

## 2019-04-13 DIAGNOSIS — Z8249 Family history of ischemic heart disease and other diseases of the circulatory system: Secondary | ICD-10-CM | POA: Diagnosis not present

## 2019-04-13 DIAGNOSIS — Z841 Family history of disorders of kidney and ureter: Secondary | ICD-10-CM | POA: Diagnosis not present

## 2019-04-13 DIAGNOSIS — F329 Major depressive disorder, single episode, unspecified: Secondary | ICD-10-CM | POA: Diagnosis not present

## 2019-04-13 DIAGNOSIS — Z882 Allergy status to sulfonamides status: Secondary | ICD-10-CM | POA: Diagnosis not present

## 2019-04-13 DIAGNOSIS — Z86010 Personal history of colonic polyps: Secondary | ICD-10-CM | POA: Insufficient documentation

## 2019-04-13 DIAGNOSIS — Z87891 Personal history of nicotine dependence: Secondary | ICD-10-CM | POA: Insufficient documentation

## 2019-04-13 DIAGNOSIS — Z9049 Acquired absence of other specified parts of digestive tract: Secondary | ICD-10-CM | POA: Insufficient documentation

## 2019-04-13 DIAGNOSIS — Z833 Family history of diabetes mellitus: Secondary | ICD-10-CM | POA: Insufficient documentation

## 2019-04-13 DIAGNOSIS — Z923 Personal history of irradiation: Secondary | ICD-10-CM | POA: Insufficient documentation

## 2019-04-13 DIAGNOSIS — Z8349 Family history of other endocrine, nutritional and metabolic diseases: Secondary | ICD-10-CM | POA: Diagnosis not present

## 2019-04-13 DIAGNOSIS — K589 Irritable bowel syndrome without diarrhea: Secondary | ICD-10-CM | POA: Insufficient documentation

## 2019-04-13 DIAGNOSIS — F419 Anxiety disorder, unspecified: Secondary | ICD-10-CM | POA: Insufficient documentation

## 2019-04-13 DIAGNOSIS — Z91040 Latex allergy status: Secondary | ICD-10-CM | POA: Diagnosis not present

## 2019-04-13 DIAGNOSIS — K219 Gastro-esophageal reflux disease without esophagitis: Secondary | ICD-10-CM | POA: Insufficient documentation

## 2019-04-13 DIAGNOSIS — Z809 Family history of malignant neoplasm, unspecified: Secondary | ICD-10-CM | POA: Diagnosis not present

## 2019-04-13 HISTORY — PX: COLONOSCOPY WITH PROPOFOL: SHX5780

## 2019-04-13 SURGERY — COLONOSCOPY WITH PROPOFOL
Anesthesia: General

## 2019-04-13 MED ORDER — LIDOCAINE HCL (CARDIAC) PF 100 MG/5ML IV SOSY
PREFILLED_SYRINGE | INTRAVENOUS | Status: DC | PRN
  Administered 2019-04-13: 60 mg via INTRAVENOUS

## 2019-04-13 MED ORDER — DEXMEDETOMIDINE HCL 200 MCG/2ML IV SOLN
INTRAVENOUS | Status: DC | PRN
  Administered 2019-04-13 (×2): 4 ug via INTRAVENOUS

## 2019-04-13 MED ORDER — SODIUM CHLORIDE 0.9 % IV SOLN
INTRAVENOUS | Status: DC
  Administered 2019-04-13: 1000 mL via INTRAVENOUS

## 2019-04-13 MED ORDER — PROPOFOL 500 MG/50ML IV EMUL
INTRAVENOUS | Status: DC | PRN
  Administered 2019-04-13: 50 mg via INTRAVENOUS
  Administered 2019-04-13: 75 ug/kg/min via INTRAVENOUS
  Administered 2019-04-13: 50 mg via INTRAVENOUS
  Administered 2019-04-13: 20 mg via INTRAVENOUS

## 2019-04-13 NOTE — Anesthesia Preprocedure Evaluation (Signed)
Anesthesia Evaluation  Patient identified by MRN, date of birth, ID band Patient awake    Reviewed: Allergy & Precautions, H&P , NPO status , Patient's Chart, lab work & pertinent test results, reviewed documented beta blocker date and time   History of Anesthesia Complications (+) PROLONGED EMERGENCE and history of anesthetic complications  Airway Mallampati: II  TM Distance: >3 FB Neck ROM: Full    Dental no notable dental hx. (+) Teeth Intact, Dental Advisory Given   Pulmonary asthma , former smoker,    Pulmonary exam normal breath sounds clear to auscultation       Cardiovascular negative cardio ROS   Rhythm:Regular Rate:Normal     Neuro/Psych  Headaches, PSYCHIATRIC DISORDERS Anxiety Depression    GI/Hepatic Neg liver ROS, GERD  Medicated and Controlled,  Endo/Other  Hypothyroidism   Renal/GU negative Renal ROS  negative genitourinary   Musculoskeletal   Abdominal   Peds  Hematology negative hematology ROS (+)   Anesthesia Other Findings   Reproductive/Obstetrics negative OB ROS                             Anesthesia Physical  Anesthesia Plan  ASA: II  Anesthesia Plan: General   Post-op Pain Management:    Induction: Intravenous  PONV Risk Score and Plan: 2 and Ondansetron, Dexamethasone and Treatment may vary due to age or medical condition  Airway Management Planned: Nasal Cannula  Additional Equipment:   Intra-op Plan:   Post-operative Plan:   Informed Consent: I have reviewed the patients History and Physical, chart, labs and discussed the procedure including the risks, benefits and alternatives for the proposed anesthesia with the patient or authorized representative who has indicated his/her understanding and acceptance.     Dental advisory given  Plan Discussed with: CRNA  Anesthesia Plan Comments:         Anesthesia Quick Evaluation

## 2019-04-13 NOTE — Transfer of Care (Signed)
Immediate Anesthesia Transfer of Care Note  Patient: Jenna [name]  Procedure(s) Performed: COLONOSCOPY WITH PROPOFOL (N/A )  Patient Location: PACU  Anesthesia Type:General  Level of Consciousness: awake and alert   Airway & Oxygen Therapy: Patient Spontanous Breathing  Post-op Assessment: Report given to RN  Post vital signs: Reviewed and stable  Last Vitals:  Vitals Value Taken Time  BP 117/58 04/13/19 1011  Temp 36.4 C 04/13/19 1010  Pulse 88 04/13/19 1011  Resp 18 04/13/19 1011  SpO2 100 % 04/13/19 1011    Last Pain:  Vitals:   04/13/19 0911  TempSrc: Temporal  PainSc: 0-No pain         Complications: No apparent anesthesia complications

## 2019-04-13 NOTE — Op Note (Signed)
[facility] Patient Name: Jenna [name] Procedure Date: 04/13/2019 9:34 AM MRN: [id number] Account #: [id number] Date of Birth: 1960/01/08 Admit Type: Outpatient Age: 59 Room: [facility] ENDO ROOM 3 Gender: Female Note Status: Finalized Procedure:             Colonoscopy Indications:           High risk colon cancer surveillance: Personal history                         of colonic polyps Providers:             Jonathon Bellows MD, MD Referring MD:          Royetta Car. Alden Hipp (Referring MD) Medicines:             Monitored Anesthesia Care Complications:         No immediate complications. Procedure:             Pre-Anesthesia Assessment:                        - Prior to the procedure, a History and Physical was                         performed, and patient medications, allergies and                         sensitivities were reviewed. The patient's tolerance                         of previous anesthesia was reviewed.                        - The risks and benefits of the procedure and the                         sedation options and risks were discussed with the                         patient. All questions were answered and informed                         consent was obtained.                        - ASA Grade Assessment: II - A patient with mild                         systemic disease.                        After obtaining informed consent, the colonoscope was                         passed under direct vision. Throughout the procedure,                         the patient's blood pressure, pulse, and oxygen                         saturations were monitored continuously. The  Colonoscope was introduced through the anus and                         advanced to the the cecum, identified by the                         appendiceal orifice. The colonoscopy was performed                         with ease. The patient tolerated the  procedure well. Findings:      The perianal and digital rectal examinations were normal.      The entire examined colon appeared normal on direct and retroflexion       views. Impression:            - The entire examined colon is normal on direct and                         retroflexion views.                        - No specimens collected. Recommendation:        - Discharge patient to home (with escort).                        - Resume previous diet.                        - Continue present medications.                        - Repeat colonoscopy in 5 years for surveillance. Procedure Code(s):     --- Professional ---                        [phone], Colonoscopy, flexible; diagnostic, including                         collection of specimen(s) by brushing or washing, when                         performed (separate procedure) Diagnosis Code(s):     --- Professional ---                        Z86.010, Personal history of colonic polyps CPT copyright 2019 American Medical Association. All rights reserved. The codes documented in this report are preliminary and upon coder review may  be revised to meet current compliance requirements. Jonathon Bellows, MD Jonathon Bellows MD, MD 04/13/2019 10:06:48 AM This report has been signed electronically. Number of Addenda: 0 Note Initiated On: 04/13/2019 9:34 AM Scope Withdrawal Time: 0 hours 13 minutes 2 seconds  Total Procedure Duration: 0 hours 18 minutes 4 seconds  Estimated Blood Loss:  Estimated blood loss: none.      [facility]

## 2019-04-13 NOTE — H&P (Signed)
Jenna Bellows, MD [address], [place], [place], [place], [address] [address], [place], [place], [place], [address] Phone: [phone]  Fax: [phone]  Primary Care Physician:  Lavada Mesi   Pre-Procedure History & Physical: HPI:  Jenna [name] is a 59 y.o. female is here for an colonoscopy.   Past Medical History:  Diagnosis Date  . Anxiety   . Asthma   . Breast cancer of lower-outer quadrant of left female breast ([place]) 09/12/2015  . Complication of anesthesia    hard time waking up with gallbladder  . Depression   . GERD (gastroesophageal reflux disease)   . Hashimoto's disease   . History of external beam radiation therapy 03/10/16-04/23/16   left breast/50.4 Gy in 28 fractions, left breast boost/10 Gy in 5 fractions  . Hypothyroidism   . Irritable bowel   . Personal history of chemotherapy 2017-2018  . Personal history of radiation therapy 2018  . Uterine displacement     Past Surgical History:  Procedure Laterality Date  . BACK SURGERY    . BREAST BIOPSY Left 09/10/2015  . BREAST LUMPECTOMY Left 09/25/2015  . BREAST LUMPECTOMY WITH NEEDLE LOCALIZATION AND AXILLARY SENTINEL LYMPH NODE BX Left 09/25/2015   Procedure: BREAST LUMPECTOMY WITH NEEDLE LOCALIZATION X'S 2 AND AXILLARY SENTINEL LYMPH NODE BX;  Surgeon: Erroll Luna, MD;  Location: [facility];  Service: General;  Laterality: Left;  . CHOLECYSTECTOMY OPEN    . disk fusion    . ENDOMETRIAL ABLATION    . MASTOPEXY Bilateral 10/25/2016   Procedure: BILATERAL MASTOPEXY;  Surgeon: Irene Limbo, MD;  Location: [facility];  Service: Plastics;  Laterality: Bilateral;  . neck fusion    . PORT-A-CATH REMOVAL Right 10/25/2016   Procedure: RIGHT CHEST PORT REMOVAL;  Surgeon: Irene Limbo, MD;  Location: [facility];  Service: Plastics;  Laterality: Right;  . PORTACATH PLACEMENT Right 09/25/2015   Procedure: INSERTION PORT-A-CATH;  Surgeon: Erroll Luna, MD;  Location: [facility];  Service: General;  Laterality: Right;  . REDUCTION MAMMAPLASTY Bilateral 2018    Prior to Admission medications   Medication Sig Start Date End Date Taking? Authorizing Provider  anastrozole (ARIMIDEX) 1 MG tablet Take 1 tablet (1 mg total) by mouth daily. 05/11/18  Yes Nicholas Lose, MD  Calcium Carbonate-Vit D-Min (CALCIUM 1200 PO) Take by mouth.   Yes [provider]  Cholecalciferol (VITAMIN D3) 1000 units CAPS Take by mouth.   Yes [provider]  DULoxetine (CYMBALTA) 60 MG capsule Take 1 capsule (60 mg total) by mouth daily. 12/12/18  Yes Breeback, Jade L, PA-C  liothyronine (CYTOMEL) 50 MCG tablet Take 1 tablet (50 mcg total) by mouth daily. 12/12/18  Yes Breeback, Jade L, PA-C  Multiple Vitamin (MULTIVITAMIN) tablet Take 1 tablet by mouth daily.   Yes [provider]  Multiple Vitamins-Minerals (OPTIC-VITES PO) Take by mouth.   Yes [provider]  omeprazole (PRILOSEC) 20 MG capsule Take 1 capsule (20 mg total) by mouth 2 (two) times daily before a meal. 12/12/18  Yes Breeback, Jade L, PA-C    Allergies as of 03/27/2019 - Review Complete 03/27/2019  Allergen Reaction Noted  . Latex Rash 03/16/2016  . Sulfa antibiotics Rash 03/12/2014    Family History  Problem Relation Age of Onset  . Diabetes Mother   . Hypertension Mother   . Hyperlipidemia Mother   . Kidney disease Mother   . Cancer Maternal Grandfather  Social History   Socioeconomic History  . Marital status: Married    Spouse name: Not on file  . Number of children: Not on file  . Years of education: Not on file  . Highest education level: Not on file  Occupational History  . Not on file  Tobacco Use  . Smoking status: Former Research scientist (life sciences)  . Smokeless tobacco: Never Used  Substance and Sexual Activity  . Alcohol use: Yes    Alcohol/week: 1.0 standard drinks    Types: 1 Standard drinks or equivalent per week    Comment:  social  . Drug use: No  . Sexual activity: Yes    Partners: Male  Other Topics Concern  . Not on file  Social History Narrative  . Not on file   Social Determinants of Health   Financial Resource Strain:   . Difficulty of Paying Living Expenses:   Food Insecurity:   . Worried About Charity fundraiser in the Last Year:   . Arboriculturist in the Last Year:   Transportation Needs:   . Film/video editor (Medical):   Marland Kitchen Lack of Transportation (Non-Medical):   Physical Activity:   . Days of Exercise per Week:   . Minutes of Exercise per Session:   Stress:   . Feeling of Stress :   Social Connections:   . Frequency of Communication with Friends and Family:   . Frequency of Social Gatherings with Friends and Family:   . Attends Religious Services:   . Active Member of Clubs or Organizations:   . Attends Archivist Meetings:   Marland Kitchen Marital Status:   Intimate Partner Violence:   . Fear of Current or Ex-Partner:   . Emotionally Abused:   Marland Kitchen Physically Abused:   . Sexually Abused:     Review of Systems: See HPI, otherwise negative ROS  Physical Exam: BP (!) 134/96   Pulse (!) 107   Temp 98 F (36.7 C) (Temporal)   Resp 18   Ht 5\' 2"  (1.575 m)   Wt 76 kg   SpO2 97%   BMI 30.63 kg/m  General:   Alert,  pleasant and cooperative in NAD Head:  Normocephalic and atraumatic. Neck:  Supple; no masses or thyromegaly. Lungs:  Clear throughout to auscultation, normal respiratory effort.    Heart:  +S1, +S2, Regular rate and rhythm, No edema. Abdomen:  Soft, nontender and nondistended. Normal bowel sounds, without guarding, and without rebound.   Neurologic:  Alert and  oriented x4;  grossly normal neurologically.  Impression/Plan: [name] is here for an colonoscopy to be performed for surveillance due to prior history of colon polyps   Risks, benefits, limitations, and alternatives regarding  colonoscopy have been reviewed with the patient.  Questions  have been answered.  All parties agreeable.   Jenna Bellows, MD  04/13/2019, 9:33 AM

## 2019-04-13 NOTE — Anesthesia Postprocedure Evaluation (Signed)
Anesthesia Post Note  Patient: Jenna [name]  Procedure(s) Performed: COLONOSCOPY WITH PROPOFOL (N/A )  Patient location during evaluation: Endoscopy Anesthesia Type: General Level of consciousness: awake and alert and oriented Pain management: pain level controlled Vital Signs Assessment: post-procedure vital signs reviewed and stable Respiratory status: spontaneous breathing Cardiovascular status: blood pressure returned to baseline Anesthetic complications: no     Last Vitals:  Vitals:   04/13/19 1040 04/13/19 1046  BP: (!) 177/78 139/83  Pulse: 78   Resp: 10 (!) 24  Temp:    SpO2: 100%     Last Pain:  Vitals:   04/13/19 1040  TempSrc:   PainSc: 0-No pain                 Devarius Nelles

## 2019-04-18 ENCOUNTER — Other Ambulatory Visit: Payer: Self-pay

## 2019-04-18 ENCOUNTER — Ambulatory Visit (INDEPENDENT_AMBULATORY_CARE_PROVIDER_SITE_OTHER): Payer: BC Managed Care – PPO | Admitting: Family Medicine

## 2019-04-18 ENCOUNTER — Encounter (INDEPENDENT_AMBULATORY_CARE_PROVIDER_SITE_OTHER): Payer: Self-pay | Admitting: Family Medicine

## 2019-04-18 VITALS — BP 132/79 | HR 92 | Temp 98.0°F | Ht 62.0 in | Wt 174.0 lb

## 2019-04-18 DIAGNOSIS — E063 Autoimmune thyroiditis: Secondary | ICD-10-CM | POA: Diagnosis not present

## 2019-04-18 DIAGNOSIS — R5383 Other fatigue: Secondary | ICD-10-CM

## 2019-04-18 DIAGNOSIS — Z6831 Body mass index (BMI) 31.0-31.9, adult: Secondary | ICD-10-CM

## 2019-04-18 DIAGNOSIS — R0602 Shortness of breath: Secondary | ICD-10-CM | POA: Diagnosis not present

## 2019-04-18 DIAGNOSIS — F418 Other specified anxiety disorders: Secondary | ICD-10-CM

## 2019-04-18 DIAGNOSIS — E669 Obesity, unspecified: Secondary | ICD-10-CM

## 2019-04-18 DIAGNOSIS — E559 Vitamin D deficiency, unspecified: Secondary | ICD-10-CM | POA: Diagnosis not present

## 2019-04-18 DIAGNOSIS — E039 Hypothyroidism, unspecified: Secondary | ICD-10-CM | POA: Diagnosis not present

## 2019-04-18 DIAGNOSIS — Z9189 Other specified personal risk factors, not elsewhere classified: Secondary | ICD-10-CM | POA: Diagnosis not present

## 2019-04-18 DIAGNOSIS — Z0289 Encounter for other administrative examinations: Secondary | ICD-10-CM

## 2019-04-18 NOTE — Progress Notes (Signed)
Dear Jenna Planas, PA-C,   Thank you for referring Jenna [name] to our clinic. The following note includes my evaluation and treatment recommendations.  Chief Complaint:   Jenna [name] (MR# [id number]) is a 59 y.o. female who presents for evaluation and treatment of Jenna and related comorbidities. Current BMI is Body mass index is 31.83 kg/m. Jenna [name] has been struggling with her weight for many years and has been unsuccessful in either losing weight, maintaining weight loss, or reaching her healthy weight goal.  Jenna [name] is currently in the action stage of change and ready to dedicate time achieving and maintaining a healthier weight. Jenna [name] is interested in becoming our patient and working on intensive lifestyle modifications including (but not limited to) diet and exercise for weight loss.  Jenna [name] reports that she skips breakfast daily.  She has journaled that she eats around 1100 calories per day.  She drinks coffee with creamer in the morning and has Oikos Zero with granola and flax seed around 10 am.  For dinner (3 pm) she will have one 5 ounce steak with 1 cup of vegetables.  She will have some chips out of the bag for a snack.  Jenna [name]'s habits were reviewed today and are as follows: Her family eats meals together, she thinks her family will eat healthier with her, her desired weight loss is 45 pounds, she has been heavy most of her life, she started gaining weight when she started her period at 59 years old, her heaviest weight ever was 185 pounds, she craves salty foods and pasta (carbs), she snacks sometimes in the evenings, she skips breakfast frequently, she frequently eats larger portions than normal and she struggles with emotional eating.  Depression Screen Jenna [name]'s Food and Mood (modified PHQ-9) score was 11.  Depression screen [facility] 2/9 04/18/2019  Decreased Interest 3  Down, Depressed, Hopeless 3  PHQ - 2 Score 6  Altered sleeping 2  Tired,  decreased energy 2  Change in appetite 1  Feeling bad or failure about yourself  0  Trouble concentrating 0  Moving slowly or fidgety/restless 0  Suicidal thoughts 0  PHQ-9 Score 11  Difficult doing work/chores Not difficult at all   Subjective:   1. Other fatigue Jenna [name] denies daytime somnolence and admits to waking up still tired. Jenna [name] has a history of symptoms of morning fatigue, morning headache and snoring. Jenna [name] generally gets 8 hours of sleep per night, and states that she has poor quality sleep. Snoring is present. Apneic episodes are not present. Epworth Sleepiness Score is 2.  2. Shortness of breath on exertion Jenna [name] notes increasing shortness of breath with exercising and seems to be worsening over time with weight gain. She notes getting out of breath sooner with activity than she used to. This has gotten worse recently. Queen denies shortness of breath at rest or orthopnea.  3. Vitamin D deficiency She is currently taking OTC vitamin D 2000 IU each day. She denies nausea, vomiting or muscle weakness.  She endorses fatigue.  4. Acquired hypothyroidism Jenna [name]'s last TSH was elevated because she stopped taking her medication.  She reports being diagnosed years ago.  She is currently taking Cytomel 50 mcg daily.  No cold/heat intolerance or palpitations.  Lab Results  Component Value Date   TSH 6.24 (H) 12/08/2018   5. Depression with anxiety She is taking duloxetine 60 mg daily.  No SI/HI.  6. At risk for osteoporosis Charlyne is at higher risk of osteopenia and  osteoporosis due to Vitamin D deficiency.   Assessment/Plan:   1. Other fatigue Edna does feel that her weight is causing her energy to be lower than it should be. Fatigue may be related to Jenna, depression or many other causes. Labs will be ordered, and in the meanwhile, Belita will focus on self care including making healthy food choices, increasing physical activity and focusing on  stress reduction. - EKG 12-Lead - Vitamin B12 - CBC with Differential/Platelet - Comprehensive metabolic panel - Insulin, random - Hemoglobin A1c - Folate  2. Shortness of breath on exertion Ausha notes increasing shortness of breath with exercising and seems to be worsening over time with weight gain. She notes getting out of breath sooner with activity than she used to. This has gotten worse recently. Mabry denies shortness of breath at rest or orthopnea. - Lipid Panel With LDL/HDL Ratio  3. Vitamin D deficiency Low Vitamin D level contributes to fatigue and are associated with Jenna, breast, and colon cancer.  Will check vitamin D level today. - VITAMIN D 25 Hydroxy (Vit-D Deficiency, Fractures)  4. Acquired hypothyroidism Will check thyroid panel today. - T3 - T4, free - TSH  5. Depression with anxiety Behavior modification techniques were discussed today to help Jenna [name] deal with her emotional/non-hunger eating behaviors.  Orders and follow up as documented in patient record.   6. At risk for osteoporosis Jenna [name] was given approximately 15 minutes of osteoporosis prevention counseling today. Jenna [name] is at risk for osteopenia and osteoporosis due to her Vitamin D deficiency. She was encouraged to take her Vitamin D and follow her higher calcium diet and increase strengthening exercise to help strengthen her bones and decrease her risk of osteopenia and osteoporosis.  Repetitive spaced learning was employed today to elicit superior memory formation and behavioral change.  7. Class 1 Jenna with serious comorbidity and body mass index (BMI) of 31.0 to 31.9 in adult, unspecified Jenna type Jenna [name] is currently in the action stage of change and her goal is to continue with weight loss efforts. I recommend See begin the structured treatment plan as follows:  She has agreed to the Category 2 Plan +100 calories.  Exercise goals: No exercise has been prescribed at  this time.   Behavioral modification strategies: increasing lean protein intake, increasing vegetables, meal planning and cooking strategies, keeping healthy foods in the home and planning for success.  She was informed of the importance of frequent follow-up visits to maximize her success with intensive lifestyle modifications for her multiple health conditions. She was informed we would discuss her lab results at her next visit unless there is a critical issue that needs to be addressed sooner. Jenna [name] agreed to keep her next visit at the agreed upon time to discuss these results.  Objective:   Blood pressure 132/79, pulse 92, temperature 98 F (36.7 C), temperature source Oral, height 5\' 2"  (1.575 m), weight 174 lb (78.9 kg), SpO2 98 %. Body mass index is 31.83 kg/m.  EKG: Normal sinus rhythm, rate 84 bpm.  Indirect Calorimeter completed today shows a VO2 of 271 and a REE of 1890.  Her calculated basal metabolic rate is [id number] thus her basal metabolic rate is better than expected.  General: Cooperative, alert, well developed, in no acute distress. HEENT: Conjunctivae and lids unremarkable. Cardiovascular: Regular rhythm.  Lungs: Normal work of breathing. Neurologic: No focal deficits.   Lab Results  Component Value Date   CREATININE 0.77 06/10/2017   BUN 11 06/10/2017   NA  143 06/10/2017   K 5.2 06/10/2017   CL 104 06/10/2017   CO2 28 06/10/2017   Lab Results  Component Value Date   ALT 18 06/10/2017   AST 22 06/10/2017   ALKPHOS 85 07/30/2016   BILITOT 0.5 06/10/2017   Lab Results  Component Value Date   TSH 6.24 (H) 12/08/2018   Lab Results  Component Value Date   CHOL 261 (H) 06/10/2017   HDL 82 06/10/2017   LDLCALC 156 (H) 06/10/2017   TRIG 117 06/10/2017   CHOLHDL 3.2 06/10/2017   Lab Results  Component Value Date   WBC 7.8 06/10/2017   HGB 13.8 06/10/2017   HCT 40.4 06/10/2017   MCV 91.4 06/10/2017   PLT 267 06/10/2017   Attestation Statements:    This is the patient's first visit at [facility]. The patient's NEW PATIENT PACKET was reviewed at length. Included in the packet: current and past health history, medications, allergies, ROS, gynecologic history (women only), surgical history, family history, social history, weight history, weight loss surgery history (for those that have had weight loss surgery), nutritional evaluation, mood and food questionnaire, PHQ9, Epworth questionnaire, sleep habits questionnaire, patient life and health improvement goals questionnaire. These will all be scanned into the patient's chart under media.   During the visit, I independently reviewed the patient's EKG, bioimpedance scale results, and indirect calorimeter results. I used this information to tailor a meal plan for the patient that will help her to lose weight and will improve her Jenna-related conditions going forward. I performed a medically necessary appropriate examination and/or evaluation. I discussed the assessment and treatment plan with the patient. The patient was provided an opportunity to ask questions and all were answered. The patient agreed with the plan and demonstrated an understanding of the instructions. Labs were ordered at this visit and will be reviewed at the next visit unless more critical results need to be addressed immediately. Clinical information was updated and documented in the EMR.   Time spent on visit including pre-visit chart review and post-visit care was 45 minutes.   A separate 15 minutes was spent on risk counseling (see above).    I, Water quality scientist, CMA, am acting as transcriptionist for Coralie Common, MD.  I have reviewed the above documentation for accuracy and completeness, and I agree with the above. - Ilene Qua, MD

## 2019-04-19 LAB — COMPREHENSIVE METABOLIC PANEL
ALT: 18 IU/L (ref 0–32)
AST: 18 IU/L (ref 0–40)
Albumin/Globulin Ratio: 1.8 (ref 1.2–2.2)
Albumin: 4.6 g/dL (ref 3.8–4.9)
Alkaline Phosphatase: 86 IU/L (ref 39–117)
BUN/Creatinine Ratio: 22 (ref 9–23)
BUN: 14 mg/dL (ref 6–24)
Bilirubin Total: 0.3 mg/dL (ref 0.0–1.2)
CO2: 24 mmol/L (ref 20–29)
Calcium: 10.2 mg/dL (ref 8.7–10.2)
Chloride: 103 mmol/L (ref 96–106)
Creatinine, Ser: 0.63 mg/dL (ref 0.57–1.00)
GFR calc Af Amer: 115 mL/min/{1.73_m2} (ref >59)
GFR calc non Af Amer: 100 mL/min/{1.73_m2} (ref >59)
Globulin, Total: 2.5 g/dL (ref 1.5–4.5)
Glucose: 87 mg/dL (ref 65–99)
Potassium: 4.4 mmol/L (ref 3.5–5.2)
Sodium: 143 mmol/L (ref 134–144)
Total Protein: 7.1 g/dL (ref 6.0–8.5)

## 2019-04-19 LAB — CBC WITH DIFFERENTIAL/PLATELET
Basophils Absolute: 0 10*3/uL (ref 0.0–0.2)
Basos: 0 %
EOS (ABSOLUTE): 0.1 10*3/uL (ref 0.0–0.4)
Eos: 2 %
Hematocrit: 44.5 % (ref 34.0–46.6)
Hemoglobin: 14.5 g/dL (ref 11.1–15.9)
Immature Grans (Abs): 0 10*3/uL (ref 0.0–0.1)
Immature Granulocytes: 0 %
Lymphocytes Absolute: 1.7 10*3/uL (ref 0.7–3.1)
Lymphs: 28 %
MCH: 29.7 pg (ref 26.6–33.0)
MCHC: 32.6 g/dL (ref 31.5–35.7)
MCV: 91 fL (ref 79–97)
Monocytes Absolute: 0.4 10*3/uL (ref 0.1–0.9)
Monocytes: 6 %
Neutrophils Absolute: 4 10*3/uL (ref 1.4–7.0)
Neutrophils: 64 %
Platelets: 284 10*3/uL (ref 150–450)
RBC: 4.88 x10E6/uL (ref 3.77–5.28)
RDW: 11.8 % (ref 11.7–15.4)
WBC: 6.2 10*3/uL (ref 3.4–10.8)

## 2019-04-19 LAB — LIPID PANEL WITH LDL/HDL RATIO
Cholesterol, Total: 225 mg/dL — ABNORMAL HIGH (ref 100–199)
HDL: 66 mg/dL (ref >39)
LDL Chol Calc (NIH): 133 mg/dL — ABNORMAL HIGH (ref 0–99)
LDL/HDL Ratio: 2 ratio (ref 0.0–3.2)
Triglycerides: 150 mg/dL — ABNORMAL HIGH (ref 0–149)
VLDL Cholesterol Cal: 26 mg/dL (ref 5–40)

## 2019-04-19 LAB — FOLATE: Folate: 13.9 ng/mL (ref >3.0)

## 2019-04-19 LAB — T3: T3, Total: 278 ng/dL — ABNORMAL HIGH (ref 71–180)

## 2019-04-19 LAB — HEMOGLOBIN A1C
Est. average glucose Bld gHb Est-mCnc: 103 mg/dL
Hgb A1c MFr Bld: 5.2 % (ref 4.8–5.6)

## 2019-04-19 LAB — VITAMIN D 25 HYDROXY (VIT D DEFICIENCY, FRACTURES): Vit D, 25-Hydroxy: 44.1 ng/mL (ref 30.0–100.0)

## 2019-04-19 LAB — VITAMIN B12: Vitamin B-12: 413 pg/mL (ref 232–1245)

## 2019-04-19 LAB — INSULIN, RANDOM: INSULIN: 11.3 u[IU]/mL (ref 2.6–24.9)

## 2019-04-19 LAB — TSH: TSH: 0.018 u[IU]/mL — ABNORMAL LOW (ref 0.450–4.500)

## 2019-04-19 LAB — T4, FREE: Free T4: 0.2 ng/dL — ABNORMAL LOW (ref 0.82–1.77)

## 2019-04-20 ENCOUNTER — Encounter (INDEPENDENT_AMBULATORY_CARE_PROVIDER_SITE_OTHER): Payer: Self-pay | Admitting: Family Medicine

## 2019-04-23 MED ORDER — LIOTHYRONINE SODIUM 25 MCG PO TABS: 25.0000 ug | ORAL_TABLET | Freq: Every day | ORAL | 0 refills | Status: DC

## 2019-04-23 NOTE — Telephone Encounter (Signed)
Please advise 

## 2019-05-02 ENCOUNTER — Encounter (INDEPENDENT_AMBULATORY_CARE_PROVIDER_SITE_OTHER): Payer: Self-pay | Admitting: Family Medicine

## 2019-05-02 ENCOUNTER — Ambulatory Visit (INDEPENDENT_AMBULATORY_CARE_PROVIDER_SITE_OTHER): Payer: BC Managed Care – PPO | Admitting: Family Medicine

## 2019-05-02 ENCOUNTER — Encounter: Payer: Self-pay | Admitting: Hematology and Oncology

## 2019-05-02 ENCOUNTER — Other Ambulatory Visit: Payer: Self-pay

## 2019-05-02 VITALS — BP 139/80 | HR 101 | Temp 98.4°F | Ht 62.0 in | Wt 171.0 lb

## 2019-05-02 DIAGNOSIS — E6609 Other obesity due to excess calories: Secondary | ICD-10-CM

## 2019-05-02 DIAGNOSIS — E559 Vitamin D deficiency, unspecified: Secondary | ICD-10-CM | POA: Diagnosis not present

## 2019-05-02 DIAGNOSIS — E8881 Metabolic syndrome: Secondary | ICD-10-CM

## 2019-05-02 DIAGNOSIS — Z9189 Other specified personal risk factors, not elsewhere classified: Secondary | ICD-10-CM | POA: Diagnosis not present

## 2019-05-02 DIAGNOSIS — E063 Autoimmune thyroiditis: Secondary | ICD-10-CM

## 2019-05-02 DIAGNOSIS — E7849 Other hyperlipidemia: Secondary | ICD-10-CM | POA: Diagnosis not present

## 2019-05-02 DIAGNOSIS — Z6830 Body mass index (BMI) 30.0-30.9, adult: Secondary | ICD-10-CM

## 2019-05-02 MED ORDER — VITAMIN D (ERGOCALCIFEROL) 1.25 MG (50000 UNIT) PO CAPS: 50000.0000 [IU] | ORAL_CAPSULE | ORAL | 0 refills | Status: DC

## 2019-05-02 NOTE — Progress Notes (Signed)
Chief Complaint:   OBESITY Jenna [name] is here to discuss her progress with her obesity treatment plan along with follow-up of her obesity related diagnoses. [place] is on the Category 2 Plan + 100 calories and states she is following her eating plan approximately 100% of the time. Rhiannan states she is walking 5 miles 3 times per week, swimming 2-3 hours 3 times per week, and gardening 80 minutes 7 times per week.  Today's visit was #: 2 Starting weight: 174 lbs Starting date: 04/18/2019 Today's weight: 171 lbs Today's date: 05/02/2019 Total lbs lost to date: 3 Total lbs lost since last in-office visit: 3  Interim History: Breakfast is hard for  Jenna [name] as she can't do eggs and is not used to eating breakfast; she was eating eggs at lunch normally. She reports eating a sandwich at lunch and feels full. She has 6-8 oz of protein and vegetables (feels full with [place] after dinner). She reports eating all 300 calories for snack.  Subjective:   Other hyperlipidemia. Jenna [name] is not on a statin. 10-year ASCVD risk is 2.7%.  Lab Results  Component Value Date   CHOL 225 (H) 04/18/2019   HDL 66 04/18/2019   LDLCALC 133 (H) 04/18/2019   TRIG 150 (H) 04/18/2019   CHOLHDL 3.2 06/10/2017   Lab Results  Component Value Date   ALT 18 04/18/2019   AST 18 04/18/2019   ALKPHOS 86 04/18/2019   BILITOT 0.3 04/18/2019   The 10-year ASCVD risk score [name] DC Jr., et al., 2013) is: 2.7%   Values used to calculate the score:     Age: 59 years     Sex: Female     Is Non-Hispanic African American: No     Diabetic: No     Tobacco smoker: No     Systolic Blood Pressure: [id number] mmHg     Is BP treated: No     HDL Cholesterol: 66 mg/dL     Total Cholesterol: 225 mg/dL  Hashimoto's disease. TSH low at 0.018 on 04/18/2019; T3 elevated and T4 low. Jenna [name] has had recent decrease in Cytomel. She has an appointment with Dr. Cruzita Lederer at [place].  Lab Results  Component Value Date    TSH 0.018 (L) 04/18/2019   Vitamin D deficiency. Jenna [name] takes 2 capsules daily (2,000 IU daily). No nausea, vomiting, or muscle weakness. She endorses fatigue. Last Vitamin D 44.1 on 04/18/2019.  Insulin resistance. Jenna [name] has a diagnosis of insulin resistance based on her elevated fasting insulin level >5. She continues to work on diet and exercise to decrease her risk of diabetes. She is on no medications.  Lab Results  Component Value Date   INSULIN 11.3 04/18/2019   Lab Results  Component Value Date   HGBA1C 5.2 04/18/2019   At risk for osteoporosis. Jenna [name] is at higher risk of osteopenia and osteoporosis due to Vitamin D deficiency.   Assessment/Plan:   Other hyperlipidemia. Cardiovascular risk and specific lipid/LDL goals reviewed.  We discussed several lifestyle modifications today and Ginnie will continue to work on diet, exercise and weight loss efforts. Orders and follow up as documented in patient record. Will repeat labs in 3 months.  Counseling Intensive lifestyle modifications are the first line treatment for this issue. . Dietary changes: Increase soluble fiber. Decrease simple carbohydrates. . Exercise changes: Moderate to vigorous-intensity aerobic activity 150 minutes per week if tolerated.  . Lipid-lowering medications: see documented in medical record.  Hashimoto's disease. Jenna [name] will follow-up with  Endocrinology as scheduled.  Vitamin D deficiency. Low Vitamin D level contributes to fatigue and are associated with obesity, breast, and colon cancer. She was given a prescription for Vitamin D, Ergocalciferol, (DRISDOL) 1.25 MG (50000 UNIT) CAPS capsule every 14 days #6 with 0 refills and will follow-up for routine testing of Vitamin D, at least 2-3 times per year to avoid over-replacement.      Insulin resistance. Jenna [name] will continue to work on weight loss, exercise, and decreasing simple carbohydrates to help decrease the risk of diabetes. Jenna [name]  agreed to follow-up with [place] as directed to closely monitor her progress. Will repeat labs in 3 months. If lack of weight loss or increased cravings, will consider medication.  At risk for osteoporosis. Jenna [name] was given approximately 30 minutes of osteoporosis prevention counseling today. Jenna [name] is at risk for osteopenia and osteoporosis due to her Vitamin D deficiency. She was encouraged to take her Vitamin D and follow her higher calcium diet and increase strengthening exercise to help strengthen her bones and decrease her risk of osteopenia and osteoporosis.  Repetitive spaced learning was employed today to elicit superior memory formation and behavioral change.  Class 1 obesity due to excess calories with serious comorbidity and body mass index (BMI) of 30.0 to 30.9 in adult.  Jenna [name] is currently in the action stage of change. As such, her goal is to continue with weight loss efforts. She has agreed to the Category 3 Plan.   Exercise goals: Jenna [name] will continue her current exercise regimen.  Behavioral modification strategies: increasing lean protein intake, meal planning and cooking strategies, keeping healthy foods in the home and planning for success.  Jenna [name] has agreed to follow-up with our clinic in 2 weeks. She was informed of the importance of frequent follow-up visits to maximize her success with intensive lifestyle modifications for her multiple health conditions.   Objective:   Blood pressure 139/80, pulse (!) 101, temperature 98.4 F (36.9 C), temperature source Oral, height 5\' 2"  (1.575 m), weight 171 lb (77.6 kg), SpO2 99 %. Body mass index is 31.28 kg/m.  General: Cooperative, alert, well developed, in no acute distress. HEENT: Conjunctivae and lids unremarkable. Cardiovascular: Regular rhythm.  Lungs: Normal work of breathing. Neurologic: No focal deficits.   Lab Results  Component Value Date   CREATININE 0.63 04/18/2019   BUN 14 04/18/2019   NA 143  04/18/2019   K 4.4 04/18/2019   CL 103 04/18/2019   CO2 24 04/18/2019   Lab Results  Component Value Date   ALT 18 04/18/2019   AST 18 04/18/2019   ALKPHOS 86 04/18/2019   BILITOT 0.3 04/18/2019   Lab Results  Component Value Date   HGBA1C 5.2 04/18/2019   Lab Results  Component Value Date   INSULIN 11.3 04/18/2019   Lab Results  Component Value Date   TSH 0.018 (L) 04/18/2019   Lab Results  Component Value Date   CHOL 225 (H) 04/18/2019   HDL 66 04/18/2019   LDLCALC 133 (H) 04/18/2019   TRIG 150 (H) 04/18/2019   CHOLHDL 3.2 06/10/2017   Lab Results  Component Value Date   WBC 6.2 04/18/2019   HGB 14.5 04/18/2019   HCT 44.5 04/18/2019   MCV 91 04/18/2019   PLT 284 04/18/2019   No results found for: IRON, TIBC, FERRITIN  Attestation Statements:   Reviewed by clinician on day of visit: allergies, medications, problem list, medical history, surgical history, family history, social history, and previous encounter notes.  Rudene Christians  Su Hoff, am acting as transcriptionist for Coralie Common, MD   I have reviewed the above documentation for accuracy and completeness, and I agree with the above. - Ilene Qua, MD

## 2019-05-08 NOTE — Addendum Note (Signed)
Addendum  created 05/08/19 2029 by Alvin Critchley, MD   [place] recorded in [place], [place] filed, [place] Event edited

## 2019-05-10 NOTE — Progress Notes (Signed)
Patient Care Team: Lavada Mesi as PCP - General (Family Medicine) Erroll Luna, MD as Consulting Physician (General Surgery) Nicholas Lose, MD as Consulting Physician (Hematology and Oncology) Gery Pray, MD as Consulting Physician (Radiation Oncology) Gardenia Phlegm, NP as Nurse Practitioner (Hematology and Oncology)  DIAGNOSIS:    ICD-10-CM   1. Malignant neoplasm of lower-outer quadrant of left breast of female, estrogen receptor positive ([place])  C50.512    Z17.0     SUMMARY OF ONCOLOGIC HISTORY: Oncology History  Breast cancer of lower-outer quadrant of left female breast (Jenna [name])  09/10/2015 Initial Diagnosis   Screening detected left breast asymmetry: 2 adjacent masses 4:00: 8 mm and 3:30: 9 mm; axilla negative, biopsy grade 2-3 IDC, ER 70%, PR 5%, Ki-67 10%, HER-2 positive ratio 3.41, T1BN0 stage IA   09/25/2015 Surgery   Left lumpectomy: IDC with DCIS, 1.2 cm, margins negative, 0/4 lymph nodes negative, grade 3, ER 70%, PR 5%, HER-2 positive ratio 3.45, Ki-67 10%, T1 CN 0 stage IA   10/31/2015 - 02/13/2016 Chemotherapy   Adjuvant chemotherapy with [place] 6 cycles followed by Herceptin maintenance for 1 year   03/10/2016 - 04/23/2016 Radiation Therapy   Adjuvant radiation therapy   05/28/2016 -  Anti-estrogen oral therapy   Anastrozole 1 mg daily, changed to letrozole after 2 months due to poor tolerance, switched to anastrozole due to right forearm pain from tendinitis     CHIEF COMPLIANT: Follow-up of left breast cancer on anastrozole therapy  INTERVAL HISTORY: [name] is a 59 y.o. with above-mentioned history of left breast cancer treated with lumpectomy, adjuvant chemotherapy, radiation, and anti-estrogen therapy with anastrozole. Mammogram on 09/20/18 showed a left breast mass. Biopsy showed a benign reactive lymph node with no evidence of malignancy. Bone density scan on 04/04/19 showed osteopenia with a T-score of -1.4 at the right femur neck.  She presents to the clinic today for annual follow-up.   She is now joined a healthy weight loss clinic and is very excited about losing some more weight.  She is trying to eat healthy and exercise regularly.  ALLERGIES:  is allergic to latex and sulfa antibiotics.  MEDICATIONS:  Current Outpatient Medications  Medication Sig Dispense Refill  . acetaminophen (TYLENOL) 500 MG tablet Take 500 mg by mouth every 6 (six) hours as needed.    Jenna [name] anastrozole (ARIMIDEX) 1 MG tablet Take 1 tablet (1 mg total) by mouth daily. 90 tablet 3  . Calcium Carbonate-Vit D-Min (CALCIUM 1200 PO) Take by mouth.    . Cholecalciferol (VITAMIN D3) 1000 units CAPS Take 1,000 Int'l Units by mouth. 2 capsules per day    . DULoxetine (CYMBALTA) 60 MG capsule Take 1 capsule (60 mg total) by mouth daily. 90 capsule 3  . liothyronine (CYTOMEL) 25 MCG tablet Take 1 tablet (25 mcg total) by mouth daily. 30 tablet 0  . Magnesium 500 MG CAPS Take 500 mg by mouth.    . Multiple Vitamin (MULTIVITAMIN) tablet Take 1 tablet by mouth daily.    . Multiple Vitamins-Minerals (OPTIC-VITES PO) Take by mouth.    Jenna [name] omeprazole (PRILOSEC) 20 MG capsule Take 1 capsule (20 mg total) by mouth 2 (two) times daily before a meal. 180 capsule 3  . Simethicone (GAS-X PO) Take by mouth.    . Vitamin D, Ergocalciferol, (DRISDOL) 1.25 MG (50000 UNIT) CAPS capsule Take 1 capsule (50,000 Units total) by mouth every 14 (fourteen) days. 6 capsule 0   No current facility-administered medications for this  visit.    PHYSICAL EXAMINATION: ECOG PERFORMANCE STATUS: 1 - Symptomatic but completely ambulatory  Vitals:   05/11/19 0911  BP: 136/90  Pulse: 80  Resp: 17  Temp: 97.8 F (36.6 C)  SpO2: 100%   Filed Weights   05/11/19 0911  Weight: 173 lb 8 oz (78.7 kg)    BREAST: No palpable masses or nodules in either right or left breasts. No palpable axillary supraclavicular or infraclavicular adenopathy no breast tenderness or nipple discharge. (exam  performed in the presence of a chaperone)  LABORATORY DATA:  I have reviewed the data as listed CMP Latest Ref Rng & Units 04/18/2019 06/10/2017 07/30/2016  Glucose 65 - 99 mg/dL 87 92 93  BUN 6 - 24 mg/dL 14 11 15.8  Creatinine 0.57 - 1.00 mg/dL 0.63 0.77 0.7  Sodium 134 - 144 mmol/L 143 143 140  Potassium 3.5 - 5.2 mmol/L 4.4 5.2 4.4  Chloride 96 - 106 mmol/L 103 104 -  CO2 20 - 29 mmol/L '24 28 26  '$ Calcium 8.7 - 10.2 mg/dL 10.2 9.9 9.4  Total Protein 6.0 - 8.5 g/dL 7.1 7.3 7.2  Total Bilirubin 0.0 - 1.2 mg/dL 0.3 0.5 0.51  Alkaline Phos 39 - 117 IU/L 86 - 85  AST 0 - 40 IU/L '18 22 26  '$ ALT 0 - 32 IU/L '18 18 25    '$ Lab Results  Component Value Date   WBC 6.2 04/18/2019   HGB 14.5 04/18/2019   HCT 44.5 04/18/2019   MCV 91 04/18/2019   PLT 284 04/18/2019   NEUTROABS 4.0 04/18/2019    ASSESSMENT & PLAN:  Breast cancer of lower-outer quadrant of left female breast ([place]) Left lumpectomy 09/25/2015: IDC with DCIS, 1.2 cm, margins negative, 0/4 lymph nodes negative, grade 3, ER 70%, PR 5%, HER-2 positive ratio 3.45, Ki-67 10%, T1 CN 0 stage IA  Treatment plan: 1. Adjuvant chemotherapy with [place] 6 cycles 10/31/2015- 2/9/2018followed by Herceptin every 3 weeks for 1 yearcompleted 07/30/2016 2. Followed by adjuvant radiation 03/10/2016- 04/23/2016 3. Followed by adjuvant antiestrogen therapystarted 05/28/2016 ---------------------------------------------------------------------------------------------------------------------------------- Anastrozole toxicities: Night sweats have disappeared. Intermittent tendinitis for which she received injections and feeling much better.  Survivorship: She is walking for half miles every day along with lots of other physical activities.  She got a new job at [employer].  She has lost 17 pounds in the last year.  CT CAP 02/07/2017: No evidence of metastatic disease, 4 mm subpleural left lower lobe nodule nonspecific Bone scan 02/07/2017: No findings  of metastatic disease  Breast cancer surveillance: 09/20/2018: 7 mm oval circumscribed mass left breast: Biopsy: Benign reactive lymph node no malignancy 05/11/2019: Breast exam: Benign  Return to clinic in1 yearfor follow-up    No orders of the defined types were placed in this encounter.  The patient has a good understanding of the overall plan. she agrees with it. she will call with any problems that may develop before the next visit here.  Total time spent: 30 mins including face to face time and time spent for planning, charting and coordination of care  Nicholas Lose, MD 05/11/2019  I, Cloyde Reams Dorshimer, am acting as scribe for Dr. Nicholas Lose.  I have reviewed the above documentation for accuracy and completeness, and I agree with the above.

## 2019-05-11 ENCOUNTER — Other Ambulatory Visit: Payer: Self-pay

## 2019-05-11 ENCOUNTER — Inpatient Hospital Stay: Payer: BC Managed Care – PPO | Attending: Hematology and Oncology | Admitting: Hematology and Oncology

## 2019-05-11 DIAGNOSIS — Z79811 Long term (current) use of aromatase inhibitors: Secondary | ICD-10-CM | POA: Insufficient documentation

## 2019-05-11 DIAGNOSIS — C50512 Malignant neoplasm of lower-outer quadrant of left female breast: Secondary | ICD-10-CM | POA: Diagnosis not present

## 2019-05-11 DIAGNOSIS — Z170 Estrogen receptor positive status [ER+]: Secondary | ICD-10-CM | POA: Diagnosis not present

## 2019-05-11 DIAGNOSIS — M85851 Other specified disorders of bone density and structure, right thigh: Secondary | ICD-10-CM | POA: Diagnosis not present

## 2019-05-11 DIAGNOSIS — Z79899 Other long term (current) drug therapy: Secondary | ICD-10-CM | POA: Diagnosis not present

## 2019-05-11 MED ORDER — ANASTROZOLE 1 MG PO TABS: 1.0000 mg | ORAL_TABLET | Freq: Every day | ORAL | 3 refills | Status: DC

## 2019-05-11 NOTE — Assessment & Plan Note (Signed)
Left lumpectomy 09/25/2015: IDC with DCIS, 1.2 cm, margins negative, 0/4 lymph nodes negative, grade 3, ER 70%, PR 5%, HER-2 positive ratio 3.45, Ki-67 10%, T1 CN 0 stage IA  Treatment plan: 1. Adjuvant chemotherapy with TCH 6 cycles 10/31/2015- 2/9/2018followed by Herceptin every 3 weeks for 1 yearcompleted 07/30/2016 2. Followed by adjuvant radiation 03/10/2016- 04/23/2016 3. Followed by adjuvant antiestrogen therapystarted 05/28/2016 ---------------------------------------------------------------------------------------------------------------------------------- Anastrozole toxicities: Night sweats have disappeared. Intermittent tendinitis for which she received injections and feeling much better.  Survivorship: She is walking for half miles every day along with lots of other physical activities.  She got a new job at [employer].  She has lost 17 pounds in the last year.  CT CAP 02/07/2017: No evidence of metastatic disease, 4 mm subpleural left lower lobe nodule nonspecific Bone scan 02/07/2017: No findings of metastatic disease  Breast cancer surveillance: 09/20/2018: 7 mm oval circumscribed mass left breast: Biopsy: Benign reactive lymph node no malignancy 05/11/2019: Breast exam: Benign  Return to clinic in1 yearfor follow-up

## 2019-05-14 ENCOUNTER — Ambulatory Visit: Payer: BC Managed Care – PPO | Admitting: Internal Medicine

## 2019-05-14 ENCOUNTER — Encounter: Payer: Self-pay | Admitting: Internal Medicine

## 2019-05-14 ENCOUNTER — Other Ambulatory Visit: Payer: Self-pay

## 2019-05-14 VITALS — BP 128/70 | HR 74 | Ht 62.0 in | Wt 173.0 lb

## 2019-05-14 DIAGNOSIS — E063 Autoimmune thyroiditis: Secondary | ICD-10-CM | POA: Diagnosis not present

## 2019-05-14 DIAGNOSIS — E038 Other specified hypothyroidism: Secondary | ICD-10-CM

## 2019-05-14 LAB — T3, FREE: T3, Free: 3.2 pg/mL (ref 2.3–4.2)

## 2019-05-14 LAB — T4, FREE: Free T4: 0.21 ng/dL — ABNORMAL LOW (ref 0.60–1.60)

## 2019-05-14 LAB — TSH: TSH: 2.15 u[IU]/mL (ref 0.35–4.50)

## 2019-05-14 NOTE — Patient Instructions (Addendum)
Please stop at the lab.  Please continue Liothyronine 25 mcg daily.  Take the thyroid hormone every day, with water, at least 30 minutes before breakfast, separated by at least 4 hours from: - acid reflux medications - calcium - iron - multivitamins  Please return in 5-6 weeks for labs.  Please come back for a follow-up appointment in 4 months.

## 2019-05-14 NOTE — Progress Notes (Signed)
Patient ID: Jenna [name], female   DOB: [birth year], 59 y.o.   MRN: [id number]   This visit occurred during the SARS-CoV-2 public health emergency.  Safety protocols were in place, including screening questions prior to the visit, additional usage of staff PPE, and extensive cleaning of exam room while observing appropriate contact time as indicated for disinfecting solutions.   HPI  Jenna [name] is a 59 y.o.-year-old female, referred by Dr. Adair Patter, for management of uncontrolled Hashimoto's hypothyroidism.  Pt. has been dx with Hashimoto's thyroiditis hypothyroidism in 2013-2014 after she described lethargy, inability to lose weight, hair loss.  She was started on Levothyroxine initially, but quickly changed to liothyronine 50 mcg daily >> more recently changed to 25 mcg daily in 04/2019 (equivalent of 100 mcg levothyroxine daily).  She takes the liothyronine: - at night! - 2-3 hours after dinner - dinner at 6 pm - black coffee - + Calcium - in am, before b'fast - + MVI - in am, before b'fast - she was on Omeprazole 2x a day >> stopped - not on Biotin lately - was on this in the past  I reviewed pt's thyroid tests: Lab Results  Component Value Date   TSH 0.018 (L) 04/18/2019   TSH 6.24 (H) 12/08/2018   TSH 0.43 06/10/2017   TSH 7.16 (H) 07/16/2016   TSH 0.10 (L) 08/12/2015   TSH 1.885 10/28/2014   TSH 1.39 05/02/2014   FREET4 0.20 (L) 04/18/2019   FREET4 1.2 08/12/2015    Her thyroid antibodies were elevated: 08/12/2015: ATA 14 (<2), TPO 162 ( (<9)  Pt describes: - no weight gain, but difficult to lose weight - + fatigue - no cold intolerance, + heat intolerance - + depression, + anxiety - no constipation - + dry skin - + hair loss  Pt denies feeling nodules in neck, hoarseness, dysphagia/odynophagia, SOB with lying down. She feels she has to clear her throat.  No FH of thyroid cancer. + FH of autoimmune ds.: sister and brother. Father with SLE. P cousins with  Hashimoto's thyroiditis. No h/o radiation tx to head or neck. No recent use of iodine supplements.  Pt. also has a history of breast cancer. On Anastrozole.  She walks and swims for exercise.  ROS: Constitutional: + Inability to lose weight,+ fatigue, + subjective hyperthermia Eyes: + Blurry vision, no xerophthalmia ENT: no sore throat, no nodules palpated in throat, no dysphagia/odynophagia, no hoarseness Cardiovascular: no CP/SOB/palpitations/leg swelling Respiratory: no cough/SOB Gastrointestinal: no N/V/D/+ C/+ heartburn Musculoskeletal: no muscle/+ joint aches Skin: no rashes, + itching, + hair loss Neurological: no tremors/numbness/tingling/dizziness Psychiatric: + both depression/anxiety  Past Medical History:  Diagnosis Date  . Anxiety   . Asthma   . Breast cancer of lower-outer quadrant of left female breast ([place]) 09/12/2015  . Complication of anesthesia    hard time waking up with gallbladder  . Depression   . Gallbladder problem   . GERD (gastroesophageal reflux disease)   . Hashimoto's disease   . Heartburn   . History of external beam radiation therapy 03/10/16-04/23/16   left breast/50.4 Gy in 28 fractions, left breast boost/10 Gy in 5 fractions  . Hypothyroidism   . Irritable bowel   . Joint pain   . Personal history of chemotherapy 2017-2018  . Personal history of radiation therapy 2018  . Uterine displacement    Past Surgical History:  Procedure Laterality Date  . BACK SURGERY    . BREAST BIOPSY Left 09/10/2015  . BREAST LUMPECTOMY Left  09/25/2015  . BREAST LUMPECTOMY WITH NEEDLE LOCALIZATION AND AXILLARY SENTINEL LYMPH NODE BX Left 09/25/2015   Procedure: BREAST LUMPECTOMY WITH NEEDLE LOCALIZATION X'S 2 AND AXILLARY SENTINEL LYMPH NODE BX;  Surgeon: Erroll Luna, MD;  Location: [facility];  Service: General;  Laterality: Left;  . CHOLECYSTECTOMY OPEN    . COLONOSCOPY WITH PROPOFOL N/A 04/13/2019   Procedure: COLONOSCOPY WITH PROPOFOL;   Surgeon: Jonathon Bellows, MD;  Location: [facility] ENDOSCOPY;  Service: Gastroenterology;  Laterality: N/A;  . disk fusion    . ENDOMETRIAL ABLATION    . MASTOPEXY Bilateral 10/25/2016   Procedure: BILATERAL MASTOPEXY;  Surgeon: Irene Limbo, MD;  Location: [place];  Service: Plastics;  Laterality: Bilateral;  . neck fusion    . PORT-A-CATH REMOVAL Right 10/25/2016   Procedure: RIGHT CHEST PORT REMOVAL;  Surgeon: Irene Limbo, MD;  Location: [place];  Service: Plastics;  Laterality: Right;  . PORTACATH PLACEMENT Right 09/25/2015   Procedure: INSERTION PORT-A-CATH;  Surgeon: Erroll Luna, MD;  Location: [place];  Service: General;  Laterality: Right;  . REDUCTION MAMMAPLASTY Bilateral 2018   Social History   Socioeconomic History  . Marital status: Married    Spouse name: Not on file  . Number of children: 2  . Years of education: Not on file  . Highest education level: Not on file  Occupational History  . Occupation: n/a  Tobacco Use  . Smoking status: Former Smoker    Years: 15.00    Quit date: 1986    Years since quitting: 35.3  . Smokeless tobacco: Never Used  Substance and Sexual Activity  . Alcohol use: Yes    Alcohol/week: 2.0 - 3.0 standard drinks    Types: 2 - 3 Standard drinks or equivalent per week    Comment: social  . Drug use: No  . Sexual activity: Yes    Partners: Male  Other Topics Concern  . Not on file  Social History Narrative  . Not on file   Social Determinants of Health   Financial Resource Strain:   . Difficulty of Paying Living Expenses:   Food Insecurity:   . Worried About Charity fundraiser in the Last Year:   . Arboriculturist in the Last Year:   Transportation Needs:   . Film/video editor (Medical):   Marland Kitchen Lack of Transportation (Non-Medical):   Physical Activity:   . Days of Exercise per Week:   . Minutes of Exercise per Session:   Stress:   . Feeling of Stress :   Social  Connections:   . Frequency of Communication with Friends and Family:   . Frequency of Social Gatherings with Friends and Family:   . Attends Religious Services:   . Active Member of Clubs or Organizations:   . Attends Archivist Meetings:   Marland Kitchen Marital Status:   Intimate Partner Violence:   . Fear of Current or Ex-Partner:   . Emotionally Abused:   Marland Kitchen Physically Abused:   . Sexually Abused:    Current Outpatient Medications on File Prior to Visit  Medication Sig Dispense Refill  . acetaminophen (TYLENOL) 500 MG tablet Take 500 mg by mouth every 6 (six) hours as needed.    Marland Kitchen anastrozole (ARIMIDEX) 1 MG tablet Take 1 tablet (1 mg total) by mouth daily. 90 tablet 3  . Calcium Carbonate-Vit D-Min (CALCIUM 1200 PO) Take by mouth.    . Cholecalciferol (VITAMIN D3) 1000 units CAPS Take  1,000 Int'l Units by mouth. 2 capsules per day    . DULoxetine (CYMBALTA) 60 MG capsule Take 1 capsule (60 mg total) by mouth daily. 90 capsule 3  . liothyronine (CYTOMEL) 25 MCG tablet Take 1 tablet (25 mcg total) by mouth daily. 30 tablet 0  . Magnesium 500 MG CAPS Take 500 mg by mouth.    . Multiple Vitamin (MULTIVITAMIN) tablet Take 1 tablet by mouth daily.    . Multiple Vitamins-Minerals (OPTIC-VITES PO) Take by mouth.    Marland Kitchen omeprazole (PRILOSEC) 20 MG capsule Take 1 capsule (20 mg total) by mouth 2 (two) times daily before a meal. 180 capsule 3  . Simethicone (GAS-X PO) Take by mouth.    . Vitamin D, Ergocalciferol, (DRISDOL) 1.25 MG (50000 UNIT) CAPS capsule Take 1 capsule (50,000 Units total) by mouth every 14 (fourteen) days. 6 capsule 0   No current facility-administered medications on file prior to visit.   Allergies  Allergen Reactions  . Latex Rash  . Sulfa Antibiotics Rash   Family History  Problem Relation Age of Onset  . Diabetes Mother   . Hypertension Mother   . Hyperlipidemia Mother   . Kidney disease Mother   . Obesity Mother   . Cancer Maternal Grandfather   . Obesity  Father   Also, please see HPI. HTN in oldest brother.  PE: BP 128/70   Pulse 74   Ht 5\' 2"  (1.575 m)   Wt 173 lb (78.5 kg)   SpO2 96%   BMI 31.64 kg/m  Wt Readings from Last 3 Encounters:  05/14/19 173 lb (78.5 kg)  05/11/19 173 lb 8 oz (78.7 kg)  05/02/19 171 lb (77.6 kg)   Constitutional: overweight, in NAD Eyes: PERRLA, EOMI, no exophthalmos ENT: moist mucous membranes, no thyromegaly, no cervical lymphadenopathy Cardiovascular: RRR, No MRG Respiratory: CTA B Gastrointestinal: abdomen soft, NT, ND, BS+ Musculoskeletal: no deformities, strength intact in all 4 Skin: moist, warm, no rashes Neurological: no tremor with outstretched hands, DTR normal in all 4  ASSESSMENT: 1.  Hypothyroidism due to Hashimoto's thyroiditis  PLAN: 1.  - Patient with history of Hashimoto's thyroiditis with subsequent hypothyroidism, with replacement only with LT3.  She has been on this for a long time, and only 3 weeks ago, the dose was reduced from 50 mcg daily to 25 mcg daily (equivalent LT4 dose of 200 >> 100 mcg daily) - latest thyroid labs reviewed with pt >> fluctuating, with the latest TSH suppressed (after which the LT3 dose was reduced): Lab Results  Component Value Date   TSH 0.018 (L) 04/18/2019  - we discussed about taking the thyroid hormone every day, with water, >30 minutes before breakfast, separated by >4 hours from acid reflux medications, calcium, iron, multivitamins. Pt. is taking it at night, actually, approximately 2 to 3 hours after dinner.  I explained that this will limit the absorption of liothyronine and I advised her to move thyroid hormones in the morning.  I explained that taking them at night may cause problems sleeping.  - She does not complain of sleeping problems, but she does complain of hot flashes.  While this can be due to anastrozole or menopause, I am wondering if it may also be caused by the excessive LT3 dose. - will check thyroid tests today: TSH, free T3  and fT4 and will also add TPO and ATA antibodies. - We discussed about the fact that the regimen relying entirely on LT3 is not advisable chronically.  She agrees  to start decreasing the dose of LT3 and transition towards a regimen with LT4 +/- a lower dose of LT3. - After we make the above change in her regimen, she will need to return for repeat TFTs in 1.5 months - we had a long discussion about her Hashimoto thyroiditis diagnosis. I explained that this is an autoimmune disorder, in which she develops antibodies against her own thyroid. The antibodies bind to the thyroid tissue and cause inflammation, and, eventually, destruction of the gland and hypothyroidism. We don't know how long this process can be, it can last from months to years.  - I also explained that thyroid enlargement especially at the beginning of her Hashimoto thyroiditis course is not uncommon, and it has a waxing and waning character.  She occasionally has problems clearing her throat, which may be due to Hashimoto's related inflammation - We discussed about treatment for Hashimoto thyroiditis, which is actually limited to thyroid hormones in case her TFTs are abnormal. Supplements like selenium has been tried with various results, some showing improvement in the TPO antibodies. However, there are no randomized controlled trials of this are consistent results between trials. We also discussed about ways to improve her immune system (relaxation, diet, exercise, sleep) to reduce the Ab titer and, subsequently, the thyroid inflammation. - We discussed about adding selenium if her antibodies are elevated. - I will see her back in 4 months, but sooner for labs  Component     Latest Ref Rng & Units 05/14/2019  Thyroperoxidase Ab SerPl-aCnc     <9 IU/mL 379 (H)  Thyroglobulin Ab     < or = 1 IU/mL 13 (H)  TSH     0.35 - 4.50 uIU/mL 2.15  T4,Free(Direct)     0.60 - 1.60 ng/dL 0.21 (L)  Triiodothyronine,Free,Serum     2.3 - 4.2 pg/mL  3.2   Her thyroid antibodies are elevated, so we will start selenium 200 mcg daily. Her TSH is normal >> will switch to 50 mcg of levothyroxine daily with 12.5 mcg of liothyronine daily (equivalent of 100 mcg daily LT4). We will have her back for labs in 1.5 months.  Philemon Kingdom, MD PhD [facility] Endocr[facility]ology

## 2019-05-15 ENCOUNTER — Telehealth: Payer: Self-pay | Admitting: Hematology and Oncology

## 2019-05-15 LAB — THYROGLOBULIN ANTIBODY: Thyroglobulin Ab: 13 IU/mL — ABNORMAL HIGH (ref <=1)

## 2019-05-15 LAB — THYROID PEROXIDASE ANTIBODY: Thyroperoxidase Ab SerPl-aCnc: 379 IU/mL — ABNORMAL HIGH (ref <9)

## 2019-05-15 MED ORDER — LEVOTHYROXINE SODIUM 50 MCG PO TABS: 50.0000 ug | ORAL_TABLET | Freq: Every day | ORAL | 1 refills | Status: DC

## 2019-05-15 MED ORDER — LIOTHYRONINE SODIUM 25 MCG PO TABS: 12.5000 ug | ORAL_TABLET | Freq: Every day | ORAL | 1 refills | Status: DC

## 2019-05-15 NOTE — Telephone Encounter (Signed)
Scheduled per 05/07 los, patient has been called and notified. 

## 2019-05-16 ENCOUNTER — Other Ambulatory Visit (INDEPENDENT_AMBULATORY_CARE_PROVIDER_SITE_OTHER): Payer: Self-pay | Admitting: Family Medicine

## 2019-05-16 DIAGNOSIS — E063 Autoimmune thyroiditis: Secondary | ICD-10-CM

## 2019-05-22 ENCOUNTER — Other Ambulatory Visit: Payer: Self-pay

## 2019-05-22 ENCOUNTER — Ambulatory Visit (INDEPENDENT_AMBULATORY_CARE_PROVIDER_SITE_OTHER): Payer: BC Managed Care – PPO | Admitting: Family Medicine

## 2019-05-22 ENCOUNTER — Encounter (INDEPENDENT_AMBULATORY_CARE_PROVIDER_SITE_OTHER): Payer: Self-pay | Admitting: Family Medicine

## 2019-05-22 VITALS — BP 121/78 | HR 84 | Temp 98.0°F | Ht 62.0 in | Wt 169.0 lb

## 2019-05-22 DIAGNOSIS — E6609 Other obesity due to excess calories: Secondary | ICD-10-CM

## 2019-05-22 DIAGNOSIS — Z6830 Body mass index (BMI) 30.0-30.9, adult: Secondary | ICD-10-CM

## 2019-05-22 DIAGNOSIS — E7849 Other hyperlipidemia: Secondary | ICD-10-CM | POA: Diagnosis not present

## 2019-05-22 DIAGNOSIS — Z9189 Other specified personal risk factors, not elsewhere classified: Secondary | ICD-10-CM | POA: Diagnosis not present

## 2019-05-22 DIAGNOSIS — E038 Other specified hypothyroidism: Secondary | ICD-10-CM | POA: Diagnosis not present

## 2019-05-22 DIAGNOSIS — E063 Autoimmune thyroiditis: Secondary | ICD-10-CM

## 2019-05-22 MED ORDER — LIOTHYRONINE SODIUM 25 MCG PO TABS: 12.5000 ug | ORAL_TABLET | Freq: Every day | ORAL | 1 refills | Status: DC

## 2019-05-22 NOTE — Progress Notes (Signed)
Chief Complaint:   OBESITY Jenna [name] is here to discuss her progress with her obesity treatment plan along with follow-up of her obesity related diagnoses. Jenna [name] is on the Category 3 Plan and states she is following her eating plan approximately 100% of the time. Jenna [name] states she is walking 2 miles and gardening 3-4 times per week.  Today's visit was #: 3 Starting weight: 174 lbs Starting date: 04/18/2019 Today's weight: 169 lbs Today's date: 05/22/2019 Total lbs lost to date: 5 Total lbs lost since last in-office visit: 2  Interim History: Jenna [name] has followed the plan 100% and denies cravings. She has questions about how to eat on plan when eating out, as she is taking a lengthy car ride to [place]. She is also asking how to incorporate alcohol into her plan.  Subjective:   Hypothyroidism due to Hashimoto's thyroiditis. Jenna [name] is on liothyronine, levothyroxine, and Selenium. She is now seeing Dr. Cruzita Lederer at [facility]. No cold intolerance or heat intolerance.  Lab Results  Component Value Date   TSH 2.15 05/14/2019   Other hyperlipidemia. Jenna [name] is not on a statin.   Lab Results  Component Value Date   CHOL 225 (H) 04/18/2019   HDL 66 04/18/2019   LDLCALC 133 (H) 04/18/2019   TRIG 150 (H) 04/18/2019   CHOLHDL 3.2 06/10/2017   Lab Results  Component Value Date   ALT 18 04/18/2019   AST 18 04/18/2019   ALKPHOS 86 04/18/2019   BILITOT 0.3 04/18/2019   The 10-year ASCVD risk score Jenna Bussing DC Jr., et al., 2013) is: 2.1%   Values used to calculate the score:     Age: 59 years     Sex: Female     Is Non-Hispanic African American: No     Diabetic: No     Tobacco smoker: No     Systolic Blood Pressure: 123XX123 mmHg     Is BP treated: No     HDL Cholesterol: 66 mg/dL     Total Cholesterol: 225 mg/dL  At risk for heart disease. Exa is at a higher than average risk for cardiovascular disease due to obesity.   Assessment/Plan:    Hypothyroidism due to Hashimoto's thyroiditis. Patient with long-standing hypothyroidism, on levothyroxine therapy. She appears euthyroid. Orders and follow up as documented in patient record. Refill was given for liothyronine (CYTOMEL) 25 MCG tablet #30 with 1 refill.  Counseling . Good thyroid control is important for overall health. Supratherapeutic thyroid levels are dangerous and will not improve weight loss results. . The correct way to take levothyroxine is fasting, with water, separated by at least 30 minutes from breakfast, and separated by more than 4 hours from calcium, iron, multivitamins, acid reflux medications (PPIs).     Other hyperlipidemia. Cardiovascular risk and specific lipid/LDL goals reviewed.  We discussed several lifestyle modifications today and Neda will continue to work on diet, exercise and weight loss efforts. Orders and follow up as documented in patient record. She will have repeat fasting lipid panel in 3 months.  Counseling Intensive lifestyle modifications are the first line treatment for this issue. . Dietary changes: Increase soluble fiber. Decrease simple carbohydrates. . Exercise changes: Moderate to vigorous-intensity aerobic activity 150 minutes per week if tolerated. . Lipid-lowering medications: see documented in medical record.  At risk for heart disease. [name] was given approximately 15 minutes of coronary artery disease prevention counseling today. She is 59 y.o. female and has risk factors for heart disease including obesity. We  discussed intensive lifestyle modifications today with an emphasis on specific weight loss instructions and strategies.   Repetitive spaced learning was employed today to elicit superior memory formation and behavioral change.  Class 1 obesity due to excess calories with serious comorbidity and body mass index (BMI) of 30.0 to 30.9 in adult.  Jenna [name] is currently in the action stage of change. As such, her goal is  to continue with weight loss efforts. She has agreed to the Category 3 Plan and will journal 1450-1600 calories and 100+ grams of protein daily.   Exercise goals: Jenna [name] will continue her current exercise regimen.  Behavioral modification strategies: increasing lean protein intake, meal planning and cooking strategies, keeping healthy foods in the home and keeping a strict food journal.  Jenna [name] has agreed to follow-up with our clinic in 2 weeks. She was informed of the importance of frequent follow-up visits to maximize her success with intensive lifestyle modifications for her multiple health conditions.   Objective:   Blood pressure 121/78, pulse 84, temperature 98 F (36.7 C), temperature source Oral, height 5\' 2"  (1.575 m), weight 169 lb (76.7 kg), SpO2 97 %. Body mass index is 30.91 kg/m.  General: Cooperative, alert, well developed, in no acute distress. HEENT: Conjunctivae and lids unremarkable. Cardiovascular: Regular rhythm.  Lungs: Normal work of breathing. Neurologic: No focal deficits.   Lab Results  Component Value Date   CREATININE 0.63 04/18/2019   BUN 14 04/18/2019   NA 143 04/18/2019   K 4.4 04/18/2019   CL 103 04/18/2019   CO2 24 04/18/2019   Lab Results  Component Value Date   ALT 18 04/18/2019   AST 18 04/18/2019   ALKPHOS 86 04/18/2019   BILITOT 0.3 04/18/2019   Lab Results  Component Value Date   HGBA1C 5.2 04/18/2019   Lab Results  Component Value Date   INSULIN 11.3 04/18/2019   Lab Results  Component Value Date   TSH 2.15 05/14/2019   Lab Results  Component Value Date   CHOL 225 (H) 04/18/2019   HDL 66 04/18/2019   LDLCALC 133 (H) 04/18/2019   TRIG 150 (H) 04/18/2019   CHOLHDL 3.2 06/10/2017   Lab Results  Component Value Date   WBC 6.2 04/18/2019   HGB 14.5 04/18/2019   HCT 44.5 04/18/2019   MCV 91 04/18/2019   PLT 284 04/18/2019   No results found for: IRON, TIBC, FERRITIN  Attestation Statements:   Reviewed by  clinician on day of visit: allergies, medications, problem list, medical history, surgical history, family history, social history, and previous encounter notes.  I, Michaelene Song, am acting as transcriptionist for Coralie Common, MD   I have reviewed the above documentation for accuracy and completeness, and I agree with the above. - Jinny Blossom, MD

## 2019-06-03 ENCOUNTER — Other Ambulatory Visit: Payer: Self-pay | Admitting: Physician Assistant

## 2019-06-03 DIAGNOSIS — E063 Autoimmune thyroiditis: Secondary | ICD-10-CM

## 2019-06-03 DIAGNOSIS — E038 Other specified hypothyroidism: Secondary | ICD-10-CM

## 2019-06-07 ENCOUNTER — Other Ambulatory Visit: Payer: Self-pay

## 2019-06-07 ENCOUNTER — Encounter (INDEPENDENT_AMBULATORY_CARE_PROVIDER_SITE_OTHER): Payer: Self-pay | Admitting: Family Medicine

## 2019-06-07 ENCOUNTER — Ambulatory Visit (INDEPENDENT_AMBULATORY_CARE_PROVIDER_SITE_OTHER): Payer: BC Managed Care – PPO | Admitting: Family Medicine

## 2019-06-07 VITALS — BP 143/82 | HR 82 | Temp 98.3°F | Ht 62.0 in | Wt 168.0 lb

## 2019-06-07 DIAGNOSIS — E669 Obesity, unspecified: Secondary | ICD-10-CM

## 2019-06-07 DIAGNOSIS — Z6830 Body mass index (BMI) 30.0-30.9, adult: Secondary | ICD-10-CM

## 2019-06-07 DIAGNOSIS — E7849 Other hyperlipidemia: Secondary | ICD-10-CM | POA: Diagnosis not present

## 2019-06-07 DIAGNOSIS — E038 Other specified hypothyroidism: Secondary | ICD-10-CM

## 2019-06-07 DIAGNOSIS — E063 Autoimmune thyroiditis: Secondary | ICD-10-CM | POA: Diagnosis not present

## 2019-06-07 MED ORDER — LIOTHYRONINE SODIUM 25 MCG PO TABS: 12.5000 ug | ORAL_TABLET | Freq: Every day | ORAL | 1 refills | Status: DC

## 2019-06-07 NOTE — Progress Notes (Signed)
Chief Complaint:   OBESITY Jenna [name] is here to discuss her progress with her obesity treatment plan along with follow-up of her obesity related diagnoses. Jenna [name] is on the Category 3 Plan and states she is following her eating plan approximately 100% of the time. Jenna [name] states she is walking 1 mile 3-4 times per week.  Today's visit was #: 4 Starting weight: 174 lbs Starting date: 04/18/2019 Today's weight: 168 lbs Today's date: 06/07/2019 Total lbs lost to date: 6 lbs Total lbs lost since last in-office visit: 1 lb  Interim History: Jenna [name] has had very low energy the last few weeks.  She says that sometimes it is a struggle to even get out of bed.  Some days she is able to accomplish normal activities and tasks.  This is new.  She may need to spend a few days with her in laws in the next few weeks.  Subjective:   1. Hypothyroidism due to Hashimoto's thyroiditis Jenna [name] is taking levothyroxine 50 mcg daily.  No heat/cold intolerance.  Positive fatigue, which is more significant than previous.  She never received her prescription from 05/22/2019 for Cytomel.  She is seeing Dr. Cruzita Lederer within the next few weeks.  Lab Results  Component Value Date   TSH 2.15 05/14/2019   2. Other hyperlipidemia Jenna [name] has hyperlipidemia and has been trying to improve her cholesterol levels with intensive lifestyle modification including a low saturated fat diet, exercise and weight loss. She denies any chest pain, claudication or myalgias.  She is not on a statin.  Lab Results  Component Value Date   ALT 18 04/18/2019   AST 18 04/18/2019   ALKPHOS 86 04/18/2019   BILITOT 0.3 04/18/2019   Lab Results  Component Value Date   CHOL 225 (H) 04/18/2019   HDL 66 04/18/2019   LDLCALC 133 (H) 04/18/2019   TRIG 150 (H) 04/18/2019   CHOLHDL 3.2 06/10/2017   Assessment/Plan:   1. Hypothyroidism due to Hashimoto's thyroiditis Patient with long-standing hypothyroidism, on levothyroxine  therapy. She appears euthyroid. Orders and follow up as documented in patient record.  Will refill Cytomel, as per below.  Counseling . Good thyroid control is important for overall health. Supratherapeutic thyroid levels are dangerous and will not improve weight loss results. . The correct way to take levothyroxine is fasting, with water, separated by at least 30 minutes from breakfast, and separated by more than 4 hours from calcium, iron, multivitamins, acid reflux medications (PPIs).  - liothyronine (CYTOMEL) 25 MCG tablet; Take 0.5 tablets (12.5 mcg total) by mouth daily.  Dispense: 30 tablet; Refill: 1  2. Other hyperlipidemia Cardiovascular risk and specific lipid/LDL goals reviewed.  We discussed several lifestyle modifications today and Jenna [name] will continue to work on diet, exercise and weight loss efforts. Orders and follow up as documented in patient record.  Will repeat labs at the end of July.  Counseling Intensive lifestyle modifications are the first line treatment for this issue. . Dietary changes: Increase soluble fiber. Decrease simple carbohydrates. . Exercise changes: Moderate to vigorous-intensity aerobic activity 150 minutes per week if tolerated. . Lipid-lowering medications: see documented in medical record.  3. Class 1 obesity with serious comorbidity and body mass index (BMI) of 30.0 to 30.9 in adult, unspecified obesity type Jenna [name] is currently in the action stage of change. As such, her goal is to continue with weight loss efforts. She has agreed to the Category 3 Plan.   Exercise goals: As is.  Behavioral modification strategies: increasing  lean protein intake, increasing vegetables, meal planning and cooking strategies, keeping healthy foods in the home and planning for success.  Verena has agreed to follow-up with our clinic in 2.5 weeks. She was informed of the importance of frequent follow-up visits to maximize her success with intensive lifestyle  modifications for her multiple health conditions.   Objective:   Blood pressure (!) 143/82, pulse 82, temperature 98.3 F (36.8 C), temperature source Oral, height 5\' 2"  (1.575 m), weight 168 lb (76.2 kg), SpO2 97 %. Body mass index is 30.73 kg/m.  General: Cooperative, alert, well developed, in no acute distress. HEENT: Conjunctivae and lids unremarkable. Cardiovascular: Regular rhythm.  Lungs: Normal work of breathing. Neurologic: No focal deficits.   Lab Results  Component Value Date   CREATININE 0.63 04/18/2019   BUN 14 04/18/2019   NA 143 04/18/2019   K 4.4 04/18/2019   CL 103 04/18/2019   CO2 24 04/18/2019   Lab Results  Component Value Date   ALT 18 04/18/2019   AST 18 04/18/2019   ALKPHOS 86 04/18/2019   BILITOT 0.3 04/18/2019   Lab Results  Component Value Date   HGBA1C 5.2 04/18/2019   Lab Results  Component Value Date   INSULIN 11.3 04/18/2019   Lab Results  Component Value Date   TSH 2.15 05/14/2019   Lab Results  Component Value Date   CHOL 225 (H) 04/18/2019   HDL 66 04/18/2019   LDLCALC 133 (H) 04/18/2019   TRIG 150 (H) 04/18/2019   CHOLHDL 3.2 06/10/2017   Lab Results  Component Value Date   WBC 6.2 04/18/2019   HGB 14.5 04/18/2019   HCT 44.5 04/18/2019   MCV 91 04/18/2019   PLT 284 04/18/2019   Attestation Statements:   Reviewed by clinician on day of visit: allergies, medications, problem list, medical history, surgical history, family history, social history, and previous encounter notes.  Time spent on visit including pre-visit chart review and post-visit care and charting was 15 minutes.   I, Water quality scientist, CMA, am acting as transcriptionist for Coralie Common, MD.  I have reviewed the above documentation for accuracy and completeness, and I agree with the above. - Jinny Blossom, MD

## 2019-06-21 ENCOUNTER — Other Ambulatory Visit (INDEPENDENT_AMBULATORY_CARE_PROVIDER_SITE_OTHER): Payer: BC Managed Care – PPO

## 2019-06-21 ENCOUNTER — Other Ambulatory Visit: Payer: Self-pay

## 2019-06-21 DIAGNOSIS — E063 Autoimmune thyroiditis: Secondary | ICD-10-CM

## 2019-06-21 DIAGNOSIS — E038 Other specified hypothyroidism: Secondary | ICD-10-CM

## 2019-06-21 LAB — T4, FREE: Free T4: 0.77 ng/dL (ref 0.60–1.60)

## 2019-06-21 LAB — TSH: TSH: 2.36 u[IU]/mL (ref 0.35–4.50)

## 2019-06-21 LAB — T3, FREE: T3, Free: 4.1 pg/mL (ref 2.3–4.2)

## 2019-06-27 ENCOUNTER — Other Ambulatory Visit: Payer: Self-pay | Admitting: Internal Medicine

## 2019-06-27 ENCOUNTER — Encounter: Payer: Self-pay | Admitting: Internal Medicine

## 2019-06-27 DIAGNOSIS — E063 Autoimmune thyroiditis: Secondary | ICD-10-CM

## 2019-06-27 MED ORDER — LEVOTHYROXINE SODIUM 88 MCG PO TABS: 88.0000 ug | ORAL_TABLET | Freq: Every day | ORAL | 3 refills | Status: DC

## 2019-06-27 MED ORDER — LIOTHYRONINE SODIUM 5 MCG PO TABS: 5.0000 ug | ORAL_TABLET | Freq: Every day | ORAL | 3 refills | Status: DC

## 2019-06-28 ENCOUNTER — Ambulatory Visit (INDEPENDENT_AMBULATORY_CARE_PROVIDER_SITE_OTHER): Payer: BC Managed Care – PPO | Admitting: Family Medicine

## 2019-06-28 ENCOUNTER — Encounter (INDEPENDENT_AMBULATORY_CARE_PROVIDER_SITE_OTHER): Payer: Self-pay | Admitting: Family Medicine

## 2019-06-28 ENCOUNTER — Other Ambulatory Visit: Payer: Self-pay

## 2019-06-28 VITALS — BP 122/84 | HR 76 | Temp 98.3°F | Ht 62.0 in | Wt 164.0 lb

## 2019-06-28 DIAGNOSIS — E559 Vitamin D deficiency, unspecified: Secondary | ICD-10-CM

## 2019-06-28 DIAGNOSIS — F419 Anxiety disorder, unspecified: Secondary | ICD-10-CM | POA: Diagnosis not present

## 2019-06-28 DIAGNOSIS — F32A Depression, unspecified: Secondary | ICD-10-CM

## 2019-06-28 DIAGNOSIS — Z9189 Other specified personal risk factors, not elsewhere classified: Secondary | ICD-10-CM | POA: Diagnosis not present

## 2019-06-28 DIAGNOSIS — E669 Obesity, unspecified: Secondary | ICD-10-CM | POA: Diagnosis not present

## 2019-06-28 DIAGNOSIS — Z6830 Body mass index (BMI) 30.0-30.9, adult: Secondary | ICD-10-CM

## 2019-06-28 DIAGNOSIS — F329 Major depressive disorder, single episode, unspecified: Secondary | ICD-10-CM

## 2019-06-28 MED ORDER — BUSPIRONE HCL 7.5 MG PO TABS: 7.5000 mg | ORAL_TABLET | Freq: Three times a day (TID) | ORAL | 0 refills | Status: DC

## 2019-06-28 MED ORDER — VITAMIN D (ERGOCALCIFEROL) 1.25 MG (50000 UNIT) PO CAPS: 50000.0000 [IU] | ORAL_CAPSULE | ORAL | 0 refills | Status: DC

## 2019-07-01 ENCOUNTER — Encounter (INDEPENDENT_AMBULATORY_CARE_PROVIDER_SITE_OTHER): Payer: Self-pay | Admitting: Family Medicine

## 2019-07-02 NOTE — Telephone Encounter (Signed)
Please advise 

## 2019-07-02 NOTE — Progress Notes (Signed)
Chief Complaint:   OBESITY Neaveh is here to discuss her progress with her obesity treatment plan along with follow-up of her obesity related diagnoses. Afsa is on the Category 3 Plan and states she is following her eating plan approximately 100% of the time. Naina states she is on the exercise bike for 30 minutes 5 times per week.  Today's visit was #: 5 Starting weight: 174 lbs Starting date: 04/18/2019 Today's weight: 164 lbs Today's date: 06/28/2019 Total lbs lost to date: 10 Total lbs lost since last in-office visit: 4  Interim History: Areta has felt very successful with following the plan even with in-laws. She denies hunger. She is not getting in the full 10 oz but at least getting in 8 oz of meat. She is doing [employer] mini, watermelon, and Protein One bar, and frozen grapes.  Subjective:   1. Vitamin D deficiency Janete denies nausea, vomiting, or muscle weakness, but she notes fatigue. She is on prescription Vit D. Last Vit D level was 44.1.  2. Anxiety and depression Edwardine is on Cymbalta. She is having panic attacks 2 times per week at least.  3. At risk for osteoporosis Chantae is at higher risk of osteopenia and osteoporosis due to Vitamin D deficiency.   Assessment/Plan:   1. Vitamin D deficiency Low Vitamin D level contributes to fatigue and are associated with obesity, breast, and colon cancer. We will refill prescription Vitamin D for 1 month. Lilibeth will follow-up for routine testing of Vitamin D, at least 2-3 times per year to avoid over-replacement.  - Vitamin D, Ergocalciferol, (DRISDOL) 1.25 MG (50000 UNIT) CAPS capsule; Take 1 capsule (50,000 Units total) by mouth every 14 (fourteen) days.  Dispense: 4 capsule; Refill: 0  2. Anxiety and depression Behavior modification techniques were discussed today to help Emsley deal with her anxiety. Kimesha agreed to start Buspar 7.5 mg PO TID with no refills. We will refer to [facility] for  a therapist and Psychiatrist. Orders and follow up as documented in patient record.   - busPIRone (BUSPAR) 7.5 MG tablet; Take 1 tablet (7.5 mg total) by mouth 3 (three) times daily.  Dispense: 90 tablet; Refill: 0 - Ambulatory referral to Psychiatry  3. At risk for osteoporosis Jalaine was given approximately 15 minutes of osteoporosis prevention counseling today. Tien is at risk for osteopenia and osteoporosis due to her Vitamin D deficiency. She was encouraged to take her Vitamin D and follow her higher calcium diet and increase strengthening exercise to help strengthen her bones and decrease her risk of osteopenia and osteoporosis.  Repetitive spaced learning was employed today to elicit superior memory formation and behavioral change.  4. Class 1 obesity with serious comorbidity and body mass index (BMI) of 30.0 to 30.9 in adult, unspecified obesity type Anabella is currently in the action stage of change. As such, her goal is to continue with weight loss efforts. She has agreed to the Category 3 Plan.   Exercise goals: As is.  Behavioral modification strategies: increasing lean protein intake, increasing vegetables, meal planning and cooking strategies and keeping healthy foods in the home.  Ruberta has agreed to follow-up with our clinic in 2 to 3 weeks. She was informed of the importance of frequent follow-up visits to maximize her success with intensive lifestyle modifications for her multiple health conditions.   Objective:   Blood pressure 122/84, pulse 76, temperature 98.3 F (36.8 C), temperature source Oral, height 5\' 2"  (1.575 m), weight 164 lb (  74.4 kg), SpO2 95 %. Body mass index is 30 kg/m.  General: Cooperative, alert, well developed, in no acute distress. HEENT: Conjunctivae and lids unremarkable. Cardiovascular: Regular rhythm.  Lungs: Normal work of breathing. Neurologic: No focal deficits.   Lab Results  Component Value Date   CREATININE 0.63 04/18/2019     BUN 14 04/18/2019   NA 143 04/18/2019   K 4.4 04/18/2019   CL 103 04/18/2019   CO2 24 04/18/2019   Lab Results  Component Value Date   ALT 18 04/18/2019   AST 18 04/18/2019   ALKPHOS 86 04/18/2019   BILITOT 0.3 04/18/2019   Lab Results  Component Value Date   HGBA1C 5.2 04/18/2019   Lab Results  Component Value Date   INSULIN 11.3 04/18/2019   Lab Results  Component Value Date   TSH 2.36 06/21/2019   Lab Results  Component Value Date   CHOL 225 (H) 04/18/2019   HDL 66 04/18/2019   LDLCALC 133 (H) 04/18/2019   TRIG 150 (H) 04/18/2019   CHOLHDL 3.2 06/10/2017   Lab Results  Component Value Date   WBC 6.2 04/18/2019   HGB 14.5 04/18/2019   HCT 44.5 04/18/2019   MCV 91 04/18/2019   PLT 284 04/18/2019   No results found for: IRON, TIBC, FERRITIN  Attestation Statements:   Reviewed by clinician on day of visit: allergies, medications, problem list, medical history, surgical history, family history, social history, and previous encounter notes.   I, Trixie Dredge, am acting as transcriptionist for Coralie Common, MD.  I have reviewed the above documentation for accuracy and completeness, and I agree with the above. - Jinny Blossom, MD

## 2019-07-16 ENCOUNTER — Other Ambulatory Visit: Payer: Self-pay

## 2019-07-16 ENCOUNTER — Ambulatory Visit (INDEPENDENT_AMBULATORY_CARE_PROVIDER_SITE_OTHER): Payer: BC Managed Care – PPO | Admitting: Family Medicine

## 2019-07-16 ENCOUNTER — Encounter (INDEPENDENT_AMBULATORY_CARE_PROVIDER_SITE_OTHER): Payer: Self-pay | Admitting: Family Medicine

## 2019-07-16 VITALS — BP 123/81 | HR 72 | Temp 98.5°F | Ht 62.0 in | Wt 163.0 lb

## 2019-07-16 DIAGNOSIS — E063 Autoimmune thyroiditis: Secondary | ICD-10-CM

## 2019-07-16 DIAGNOSIS — E038 Other specified hypothyroidism: Secondary | ICD-10-CM

## 2019-07-16 DIAGNOSIS — F3289 Other specified depressive episodes: Secondary | ICD-10-CM

## 2019-07-16 DIAGNOSIS — Z6830 Body mass index (BMI) 30.0-30.9, adult: Secondary | ICD-10-CM

## 2019-07-16 DIAGNOSIS — E669 Obesity, unspecified: Secondary | ICD-10-CM

## 2019-07-18 NOTE — Progress Notes (Signed)
Chief Complaint:   OBESITY Jenna [name] is here to discuss her progress with her obesity treatment plan along with follow-up of her obesity related diagnoses. Jenna [name] is on the Category 3 Plan + 100 calorie and states she is following her eating plan approximately 99.9% of the time. Jenna [name] states she is swimming 60 minutes 3 to 4 times per week and riding the exercise bike 30 minutes for 5 times a week.  Today's visit was #: 6 Starting weight: 174 lbs  Starting date: 04/18/19 Today's weight: 163 lbs Today's date: 07/16/2019 Total lbs lost to date: 11 Total lbs lost since last in-office visit: 1  Interim History: Jenna [name] has been following her meal plan almost completely. She is just unsure as to why she has only lost 1 pound. Jenna [name] has been exercising quite a bit. She is planning to go hiking every Sunday in a [facility].  Subjective:   1. Other depression, with emotional eating Jenna [name] is struggling with emotional eating and using food for comfort to the extent that it is negatively impacting her health. Jenna [name] is on Cymbalta and Buspar. She has an appointment 1 week from today with her therapist. She has been working on behavior modification techniques to help reduce her emotional eating and has been somewhat successful. She shows no sign of suicidal or homicidal ideations.  2. Hypothyroidism due to Hashimoto's thyroiditis Jenna [name] is now on levothyroxine and Cytomel and sees Dr. Renne Crigler.  Lab Results  Component Value Date   TSH 2.36 06/21/2019   Assessment/Plan:   1. Other depression, with emotional eating Behavior modification techniques were discussed today to help Jenna [name] deal with her emotional/non-hunger eating behaviors.  Orders and follow up as documented in patient record. Jenna [name] agrees to follow up with her therapist.  2. Hypothyroidism due to Hashimoto's thyroiditis Patient with long-standing hypothyroidism, on levothyroxine therapy. She appears  euthyroid. Orders and follow up as documented in patient record. Jenna [name] agrees to follow up with Dr. Renne Crigler and there is no change in medications.  Counseling . Good thyroid control is important for overall health. Supratherapeutic thyroid levels are dangerous and will not improve weight loss results. . The correct way to take levothyroxine is fasting, with water, separated by at least 30 minutes from breakfast, and separated by more than 4 hours from calcium, iron, multivitamins, acid reflux medications (PPIs).   3. Class 1 obesity with serious comorbidity and body mass index (BMI) of 30.0 to 30.9 in adult, unspecified obesity type  Jenna [name] is currently in the action stage of change. As such, her goal is to continue with weight loss efforts. She has agreed to the Category 3 Plan +100 calories.   Exercise goals: Jenna [name] agrees to continue swimming and will increase resistance in the water.  Behavioral modification strategies: increasing lean protein intake, meal planning and cooking strategies, keeping healthy foods in the home, and planning for success.  Jenna [name] has agreed to follow-up with our clinic in 2 to 3 weeks. She was informed of the importance of frequent follow-up visits to maximize her success with intensive lifestyle modifications for her multiple health conditions.   Jenna [name] was informed we would discuss her lab results at her next visit unless there is a critical issue that needs to be addressed sooner. Jenna [name] agreed to keep her next visit at the agreed upon time to discuss these results.  Objective:   Blood pressure 123/81, pulse 72, temperature 98.5 F (36.9 C), temperature source Oral, height 5\' 2"  (1.575 m), weight  163 lb (73.9 kg), SpO2 98 %. Body mass index is 29.81 kg/m.  General: Cooperative, alert, well developed, in no acute distress. HEENT: Conjunctivae and lids unremarkable. Cardiovascular: Regular rhythm.  Lungs: Normal work of breathing. Neurologic:  No focal deficits.   Lab Results  Component Value Date   CREATININE 0.63 04/18/2019   BUN 14 04/18/2019   NA 143 04/18/2019   K 4.4 04/18/2019   CL 103 04/18/2019   CO2 24 04/18/2019   Lab Results  Component Value Date   ALT 18 04/18/2019   AST 18 04/18/2019   ALKPHOS 86 04/18/2019   BILITOT 0.3 04/18/2019   Lab Results  Component Value Date   HGBA1C 5.2 04/18/2019   Lab Results  Component Value Date   INSULIN 11.3 04/18/2019   Lab Results  Component Value Date   TSH 2.36 06/21/2019   Lab Results  Component Value Date   CHOL 225 (H) 04/18/2019   HDL 66 04/18/2019   LDLCALC 133 (H) 04/18/2019   TRIG 150 (H) 04/18/2019   CHOLHDL 3.2 06/10/2017   Lab Results  Component Value Date   WBC 6.2 04/18/2019   HGB 14.5 04/18/2019   HCT 44.5 04/18/2019   MCV 91 04/18/2019   PLT 284 04/18/2019   No results found for: IRON, TIBC, FERRITIN   Attestation Statements:   Reviewed by clinician on day of visit: allergies, medications, problem list, medical history, surgical history, family history, social history, and previous encounter notes.  Time spent on visit including pre-visit chart review and post-visit care and charting was 15 minutes.   IMarcille Blanco, CMA, am acting as transcriptionist for Coralie Common, MD  I have reviewed the above documentation for accuracy and completeness, and I agree with the above. - Jinny Blossom, MD

## 2019-07-22 ENCOUNTER — Other Ambulatory Visit (INDEPENDENT_AMBULATORY_CARE_PROVIDER_SITE_OTHER): Payer: Self-pay | Admitting: Family Medicine

## 2019-07-22 DIAGNOSIS — E559 Vitamin D deficiency, unspecified: Secondary | ICD-10-CM

## 2019-07-22 DIAGNOSIS — F419 Anxiety disorder, unspecified: Secondary | ICD-10-CM

## 2019-07-23 ENCOUNTER — Ambulatory Visit (INDEPENDENT_AMBULATORY_CARE_PROVIDER_SITE_OTHER): Payer: BC Managed Care – PPO | Admitting: Psychology

## 2019-07-23 DIAGNOSIS — F411 Generalized anxiety disorder: Secondary | ICD-10-CM

## 2019-07-23 DIAGNOSIS — F331 Major depressive disorder, recurrent, moderate: Secondary | ICD-10-CM

## 2019-07-24 ENCOUNTER — Encounter (INDEPENDENT_AMBULATORY_CARE_PROVIDER_SITE_OTHER): Payer: Self-pay

## 2019-07-30 DIAGNOSIS — H2513 Age-related nuclear cataract, bilateral: Secondary | ICD-10-CM | POA: Diagnosis not present

## 2019-07-30 DIAGNOSIS — H43811 Vitreous degeneration, right eye: Secondary | ICD-10-CM | POA: Diagnosis not present

## 2019-07-30 DIAGNOSIS — H35371 Puckering of macula, right eye: Secondary | ICD-10-CM | POA: Diagnosis not present

## 2019-07-30 DIAGNOSIS — H5051 Esophoria: Secondary | ICD-10-CM | POA: Diagnosis not present

## 2019-08-01 ENCOUNTER — Other Ambulatory Visit: Payer: Self-pay

## 2019-08-01 ENCOUNTER — Ambulatory Visit (INDEPENDENT_AMBULATORY_CARE_PROVIDER_SITE_OTHER): Payer: BC Managed Care – PPO | Admitting: Family Medicine

## 2019-08-01 VITALS — BP 125/74 | HR 79 | Temp 98.0°F | Ht 62.0 in | Wt 163.0 lb

## 2019-08-01 DIAGNOSIS — Z9189 Other specified personal risk factors, not elsewhere classified: Secondary | ICD-10-CM

## 2019-08-01 DIAGNOSIS — E669 Obesity, unspecified: Secondary | ICD-10-CM

## 2019-08-01 DIAGNOSIS — Z6830 Body mass index (BMI) 30.0-30.9, adult: Secondary | ICD-10-CM

## 2019-08-01 DIAGNOSIS — F3289 Other specified depressive episodes: Secondary | ICD-10-CM

## 2019-08-01 DIAGNOSIS — E559 Vitamin D deficiency, unspecified: Secondary | ICD-10-CM | POA: Diagnosis not present

## 2019-08-01 DIAGNOSIS — E038 Other specified hypothyroidism: Secondary | ICD-10-CM | POA: Diagnosis not present

## 2019-08-01 NOTE — Progress Notes (Signed)
Chief Complaint:   OBESITY Jenna [name] is here to discuss her progress with her obesity treatment plan along with follow-up of her obesity related diagnoses. Jenna [name] is on the Category 3 Plan +100 calories and states she is following her eating plan approximately 100% of the time. Jenna [name] states she is using the stationary bike and/or swimming for 30 minutes 7 times per week.  Today's visit was #: 7 Starting weight: 174 lbs Starting date: 04/18/2019 Today's weight: 163 lbs Today's date: 08/02/2019 Total lbs lost to date: 11 lbs Total lbs lost since last in-office visit: 0  Interim History: Jenna [name] says she followed the meal plan 100%.  She says she added resistance training in the pool per recommendations from her last office visit.  She says she also increased her cardio to 30 minutes per day, not just every other day.  She says that her under is less controlled than usual.  Denies cravings.  She reports that her clothes have been fitting looser overall and she feels better.  She feels that exercise is helping her as well.  Subjective:   1. Other specified hypothyroidism Jenna [name] is taking levothyroxine 88 mcg daily.  Treatment per Jenna [name].  She is tolerating her medication well.  Lab Results  Component Value Date   TSH 2.36 06/21/2019   2. Vitamin D deficiency Jenna [name]'s Vitamin D level was 44.1 on 04/18/2019. She is currently taking prescription vitamin D 50,000 IU each week. She denies nausea, vomiting or muscle weakness.  3. Other depression, with emotional eating She says she has not had any panic attacks.  Positive for anxiety and depression.  She sees her therapist every 2 weeks now.  She just started this recently.  Dr. Jearld [name] recently started her on Buspar at last office visit, but she has not used it at all.  She is tolerating Cymbalta well.    4. At risk for osteoporosis Jenna [name] is at higher risk of osteopenia and osteoporosis due to Vitamin D deficiency.    Assessment/Plan:   1. Other specified hypothyroidism Patient with long-standing hypothyroidism, on levothyroxine therapy. She appears euthyroid. Orders and follow up as documented in patient record.  Thyroid labs were recently within normal limits.  Counseling . Good thyroid control is important for overall health. Supratherapeutic thyroid levels are dangerous and will not improve weight loss results. . The correct way to take levothyroxine is fasting, with water, separated by at least 30 minutes from breakfast, and separated by more than 4 hours from calcium, iron, multivitamins, acid reflux medications (PPIs).   2. Vitamin D deficiency Low Vitamin D level contributes to fatigue and are associated with obesity, breast, and colon cancer. She agrees to continue to take prescription Vitamin D @50 ,000 IU every 2 weeks and will follow-up for routine testing of Vitamin D, at least 2-3 times per year to avoid over-replacement. - Vitamin D, Ergocalciferol, (DRISDOL) 1.25 MG (50000 UNIT) CAPS capsule; Take 1 capsule (50,000 Units total) by mouth every 14 (fourteen) days.  Dispense: 4 capsule; Refill: 0  3. Other depression, with emotional eating Continue with therapist.  Continue Cymbalta per PCP and discontinue Buspar.  4. At risk for osteoporosis Unique was given approximately 15 minutes of osteoporosis prevention counseling today. Jenna [name] is at risk for osteopenia and osteoporosis due to her Vitamin D deficiency. She was encouraged to take her Vitamin D and follow her higher calcium diet and increase strengthening exercise to help strengthen her bones and decrease her risk of osteopenia and osteoporosis.  Repetitive spaced learning was employed today to elicit superior memory formation and behavioral change.  5. Class 1 obesity with serious comorbidity and body mass index (BMI) of 30.0 to 30.9 in adult, unspecified obesity type Jenna [name] is currently in the action stage of change. As such, her  goal is to continue with weight loss efforts. She has agreed to the Category 3 Plan +100 calories of higher protein option, 10:1 (gave handout on protein equivalent).   Exercise goals: As is.  Behavioral modification strategies: increasing water intake, meal planning and cooking strategies and planning for success.  Jenna [name] has agreed to follow-up with our clinic in 2 weeks. She was informed of the importance of frequent follow-up visits to maximize her success with intensive lifestyle modifications for her multiple health conditions.   Objective:   Blood pressure 125/74, pulse 79, temperature 98 F (36.7 C), height 5\' 2"  (1.575 m), weight 163 lb (73.9 kg), SpO2 98 %. Body mass index is 29.81 kg/m.  General: Cooperative, alert, well developed, in no acute distress. HEENT: Conjunctivae and lids unremarkable. Cardiovascular: Regular rhythm.  Lungs: Normal work of breathing. Neurologic: No focal deficits.   Lab Results  Component Value Date   CREATININE 0.63 04/18/2019   BUN 14 04/18/2019   NA 143 04/18/2019   K 4.4 04/18/2019   CL 103 04/18/2019   CO2 24 04/18/2019   Lab Results  Component Value Date   ALT 18 04/18/2019   AST 18 04/18/2019   ALKPHOS 86 04/18/2019   BILITOT 0.3 04/18/2019   Lab Results  Component Value Date   HGBA1C 5.2 04/18/2019   Lab Results  Component Value Date   INSULIN 11.3 04/18/2019   Lab Results  Component Value Date   TSH 2.36 06/21/2019   Lab Results  Component Value Date   CHOL 225 (H) 04/18/2019   HDL 66 04/18/2019   LDLCALC 133 (H) 04/18/2019   TRIG 150 (H) 04/18/2019   CHOLHDL 3.2 06/10/2017   Lab Results  Component Value Date   WBC 6.2 04/18/2019   HGB 14.5 04/18/2019   HCT 44.5 04/18/2019   MCV 91 04/18/2019   PLT 284 04/18/2019   Attestation Statements:   Reviewed by clinician on day of visit: allergies, medications, problem list, medical history, surgical history, family history, social history, and previous  encounter notes.  I, Water quality scientist, CMA, am acting as Location manager for [employer], DO.  I have reviewed the above documentation for accuracy and completeness, and I agree with the above. Mellody Dance, DO

## 2019-08-02 MED ORDER — VITAMIN D (ERGOCALCIFEROL) 1.25 MG (50000 UNIT) PO CAPS: 50000.0000 [IU] | ORAL_CAPSULE | ORAL | 0 refills | Status: DC

## 2019-08-06 ENCOUNTER — Other Ambulatory Visit: Payer: Self-pay | Admitting: Internal Medicine

## 2019-08-08 ENCOUNTER — Ambulatory Visit (INDEPENDENT_AMBULATORY_CARE_PROVIDER_SITE_OTHER): Payer: BC Managed Care – PPO | Admitting: Psychology

## 2019-08-08 DIAGNOSIS — F411 Generalized anxiety disorder: Secondary | ICD-10-CM | POA: Diagnosis not present

## 2019-08-08 DIAGNOSIS — F331 Major depressive disorder, recurrent, moderate: Secondary | ICD-10-CM

## 2019-08-16 ENCOUNTER — Other Ambulatory Visit: Payer: Self-pay

## 2019-08-16 ENCOUNTER — Ambulatory Visit (INDEPENDENT_AMBULATORY_CARE_PROVIDER_SITE_OTHER): Payer: BC Managed Care – PPO | Admitting: Family Medicine

## 2019-08-16 ENCOUNTER — Encounter (INDEPENDENT_AMBULATORY_CARE_PROVIDER_SITE_OTHER): Payer: Self-pay | Admitting: Family Medicine

## 2019-08-16 VITALS — BP 116/73 | HR 75 | Temp 98.2°F | Ht 62.0 in | Wt 164.0 lb

## 2019-08-16 DIAGNOSIS — Z6830 Body mass index (BMI) 30.0-30.9, adult: Secondary | ICD-10-CM

## 2019-08-16 DIAGNOSIS — F419 Anxiety disorder, unspecified: Secondary | ICD-10-CM | POA: Diagnosis not present

## 2019-08-16 DIAGNOSIS — E559 Vitamin D deficiency, unspecified: Secondary | ICD-10-CM

## 2019-08-16 DIAGNOSIS — E669 Obesity, unspecified: Secondary | ICD-10-CM

## 2019-08-20 NOTE — Progress Notes (Signed)
Chief Complaint:   OBESITY Deloras is here to discuss her progress with her obesity treatment plan along with follow-up of her obesity related diagnoses. Sophina is on the Category 3 Plan + 100 calories and states she is following her eating plan approximately 97% of the time. Avira states she is riding the stationary bike for 45 minutes 7 times per week, and swimming for 30 minutes 3 times per week.  Today's visit was #: 8 Starting weight: 174 lbs Starting date: 04/18/2019 Today's weight: 164 lbs Today's date: 08/16/2019 Total lbs lost to date: 10 Total lbs lost since last in-office visit: 0  Interim History: Cornelia has not been hitting her calories secondary to stress and she is not a stress eater. She will have to go to [place] for at least 6 weeks to help care for her aunt with dementia. Her stress comes from her mother being there. She is leaving in the morning.  Subjective:   1. Vitamin D deficiency Leeah denies nausea, vomiting, or muscle weakness, but she notes fatigue. She is on prescription Vit D, but last Vit D level was 44.3.  2. Anxiety Youa has Buspar as needed for anxiety. She is working with a Transport planner.  Assessment/Plan:   1. Vitamin D deficiency Low Vitamin D level contributes to fatigue and are associated with obesity, breast, and colon cancer. Tyler prescription Vitamin D 50,000 IU every 14 days, no refill needed. She will follow-up for routine testing of Vitamin D, at least 2-3 times per year to avoid over-replacement.  2. Anxiety Behavior modification techniques were discussed today to help Maritza deal with her anxiety. Metta will continue her medications with no changes. Orders and follow up as documented in patient record.   3. Class 1 obesity with serious comorbidity and body mass index (BMI) of 30.0 to 30.9 in adult, unspecified obesity type Carollee is currently in the action stage of change. As such, her goal is to continue  with weight loss efforts. She has agreed to the Category 3 Plan + 100 calories.   Exercise goals: As is.  Behavioral modification strategies: increasing lean protein intake, increasing vegetables, meal planning and cooking strategies, keeping healthy foods in the home and planning for success.  Skie has agreed to follow-up with our clinic in 6 to 7 weeks. She was informed of the importance of frequent follow-up visits to maximize her success with intensive lifestyle modifications for her multiple health conditions.   Objective:   Blood pressure 116/73, pulse 75, temperature 98.2 F (36.8 C), temperature source Oral, height 5\' 2"  (1.575 m), weight 164 lb (74.4 kg), SpO2 96 %. Body mass index is 30 kg/m.  General: Cooperative, alert, well developed, in no acute distress. HEENT: Conjunctivae and lids unremarkable. Cardiovascular: Regular rhythm.  Lungs: Normal work of breathing. Neurologic: No focal deficits.   Lab Results  Component Value Date   CREATININE 0.63 04/18/2019   BUN 14 04/18/2019   NA 143 04/18/2019   K 4.4 04/18/2019   CL 103 04/18/2019   CO2 24 04/18/2019   Lab Results  Component Value Date   ALT 18 04/18/2019   AST 18 04/18/2019   ALKPHOS 86 04/18/2019   BILITOT 0.3 04/18/2019   Lab Results  Component Value Date   HGBA1C 5.2 04/18/2019   Lab Results  Component Value Date   INSULIN 11.3 04/18/2019   Lab Results  Component Value Date   TSH 2.36 06/21/2019   Lab Results  Component Value Date  CHOL 225 (H) 04/18/2019   HDL 66 04/18/2019   LDLCALC 133 (H) 04/18/2019   TRIG 150 (H) 04/18/2019   CHOLHDL 3.2 06/10/2017   Lab Results  Component Value Date   WBC 6.2 04/18/2019   HGB 14.5 04/18/2019   HCT 44.5 04/18/2019   MCV 91 04/18/2019   PLT 284 04/18/2019   No results found for: IRON, TIBC, FERRITIN  Attestation Statements:   Reviewed by clinician on day of visit: allergies, medications, problem list, medical history, surgical history,  family history, social history, and previous encounter notes.  Time spent on visit including pre-visit chart review and post-visit care and charting was 16 minutes.    I, Trixie Dredge, am acting as transcriptionist for Coralie Common, MD.  I have reviewed the above documentation for accuracy and completeness, and I agree with the above. - Jinny Blossom, MD

## 2019-08-28 ENCOUNTER — Ambulatory Visit: Payer: BC Managed Care – PPO | Admitting: Psychology

## 2019-08-30 ENCOUNTER — Other Ambulatory Visit (INDEPENDENT_AMBULATORY_CARE_PROVIDER_SITE_OTHER): Payer: Self-pay | Admitting: Family Medicine

## 2019-08-30 DIAGNOSIS — E559 Vitamin D deficiency, unspecified: Secondary | ICD-10-CM

## 2019-09-14 ENCOUNTER — Ambulatory Visit: Payer: BC Managed Care – PPO | Admitting: Internal Medicine

## 2019-09-27 ENCOUNTER — Ambulatory Visit (INDEPENDENT_AMBULATORY_CARE_PROVIDER_SITE_OTHER): Payer: BC Managed Care – PPO | Admitting: Family Medicine

## 2019-09-28 ENCOUNTER — Other Ambulatory Visit: Payer: Self-pay | Admitting: Hematology and Oncology

## 2019-09-28 DIAGNOSIS — Z853 Personal history of malignant neoplasm of breast: Secondary | ICD-10-CM

## 2019-10-09 ENCOUNTER — Other Ambulatory Visit: Payer: Self-pay

## 2019-10-09 ENCOUNTER — Encounter (INDEPENDENT_AMBULATORY_CARE_PROVIDER_SITE_OTHER): Payer: Self-pay | Admitting: Family Medicine

## 2019-10-09 ENCOUNTER — Ambulatory Visit (INDEPENDENT_AMBULATORY_CARE_PROVIDER_SITE_OTHER): Payer: BC Managed Care – PPO | Admitting: Family Medicine

## 2019-10-09 VITALS — BP 130/70 | HR 88 | Temp 98.6°F | Ht 62.0 in | Wt 161.0 lb

## 2019-10-09 DIAGNOSIS — E669 Obesity, unspecified: Secondary | ICD-10-CM

## 2019-10-09 DIAGNOSIS — Z6830 Body mass index (BMI) 30.0-30.9, adult: Secondary | ICD-10-CM | POA: Diagnosis not present

## 2019-10-09 DIAGNOSIS — E7849 Other hyperlipidemia: Secondary | ICD-10-CM

## 2019-10-09 DIAGNOSIS — E559 Vitamin D deficiency, unspecified: Secondary | ICD-10-CM

## 2019-10-09 DIAGNOSIS — E66811 Obesity, class 1: Secondary | ICD-10-CM

## 2019-10-09 NOTE — Progress Notes (Signed)
Chief Complaint:   OBESITY Jenna [name] is here to discuss her progress with her obesity treatment plan along with follow-up of her obesity related diagnoses. Jenna [name] is on the Category 3 Plan + 100 calories and states she is following her eating plan approximately 98% of the time. Jenna [name] states she is walking for 30 minutes 3-4 times per week.  Today's visit was #: 9 Starting weight: 174 lbs Starting date: 04/18/2019 Today's weight: 161 lbs Today's date: 10/09/2019 Total lbs lost to date: 13 Total lbs lost since last in-office visit: 3  Interim History: Jenna [name] returns from [place] after being there caring for her aunt for 6 weeks. She struggled to find time  To exercise. She does anticipate going back to [place] for another 6 weeks in January. She would like to continue her current plan.  Subjective:   1. Other hyperlipidemia Jenna [name]'s last LDL was 133, HDL 66, and triglycerides 150. She is not on statin.  2. Vitamin D deficiency Jenna [name]'s last Vit D level was 44.1. She denies nausea, vomiting, or muscle weakness, but notes fatigue. She is on prescription Vit D.  Assessment/Plan:   1. Other hyperlipidemia Cardiovascular risk and specific lipid/LDL goals reviewed. We discussed several lifestyle modifications today and Jenna [name] will continue to work on diet, exercise and weight loss efforts. We will repeat labs at her next appointment. Orders and follow up as documented in patient record.   Counseling Intensive lifestyle modifications are the first line treatment for this issue. . Dietary changes: Increase soluble fiber. Decrease simple carbohydrates. . Exercise changes: Moderate to vigorous-intensity aerobic activity 150 minutes per week if tolerated. . Lipid-lowering medications: see documented in medical record.  2. Vitamin D deficiency Low Vitamin D level contributes to fatigue and are associated with obesity, breast, and colon cancer. Jenna [name] agreed to continue  taking prescription Vitamin D 50,000 IU every 14 days and will follow-up for routine testing of Vitamin D, at least 2-3 times per year to avoid over-replacement. We will repeat labs at her next appointment.  3. Class 1 obesity with serious comorbidity and body mass index (BMI) of 30.0 to 30.9 in adult, unspecified obesity type Jenna [name] is currently in the action stage of change. As such, her goal is to continue with weight loss efforts. She has agreed to the Category 3 Plan.   Exercise goals: As is.  Behavioral modification strategies: increasing lean protein intake, increasing vegetables, meal planning and cooking strategies, keeping healthy foods in the home and planning for success.  Jenna [name] has agreed to follow-up with our clinic in 2 weeks. She was informed of the importance of frequent follow-up visits to maximize her success with intensive lifestyle modifications for her multiple health conditions.   Objective:   Blood pressure 130/70, pulse 88, temperature 98.6 F (37 C), height 5\' 2"  (1.575 m), weight 161 lb (73 kg), SpO2 96 %. Body mass index is 29.45 kg/m.  General: Cooperative, alert, well developed, in no acute distress. HEENT: Conjunctivae and lids unremarkable. Cardiovascular: Regular rhythm.  Lungs: Normal work of breathing. Neurologic: No focal deficits.   Lab Results  Component Value Date   CREATININE 0.63 04/18/2019   BUN 14 04/18/2019   NA 143 04/18/2019   K 4.4 04/18/2019   CL 103 04/18/2019   CO2 24 04/18/2019   Lab Results  Component Value Date   ALT 18 04/18/2019   AST 18 04/18/2019   ALKPHOS 86 04/18/2019   BILITOT 0.3 04/18/2019   Lab Results  Component Value  Date   HGBA1C 5.2 04/18/2019   Lab Results  Component Value Date   INSULIN 11.3 04/18/2019   Lab Results  Component Value Date   TSH 2.36 06/21/2019   Lab Results  Component Value Date   CHOL 225 (H) 04/18/2019   HDL 66 04/18/2019   LDLCALC 133 (H) 04/18/2019   TRIG 150 (H)  04/18/2019   CHOLHDL 3.2 06/10/2017   Lab Results  Component Value Date   WBC 6.2 04/18/2019   HGB 14.5 04/18/2019   HCT 44.5 04/18/2019   MCV 91 04/18/2019   PLT 284 04/18/2019   No results found for: IRON, TIBC, FERRITIN   Attestation Statements:   Reviewed by clinician on day of visit: allergies, medications, problem list, medical history, surgical history, family history, social history, and previous encounter notes.  Time spent on visit including pre-visit chart review and post-visit care and charting was 15 minutes.    I, Trixie Dredge, am acting as transcriptionist for Coralie Common, MD.  I have reviewed the above documentation for accuracy and completeness, and I agree with the above. - Jinny Blossom, MD

## 2019-10-15 ENCOUNTER — Other Ambulatory Visit: Payer: Self-pay

## 2019-10-15 ENCOUNTER — Ambulatory Visit
Admission: RE | Admit: 2019-10-15 | Discharge: 2019-10-15 | Disposition: A | Discharge status: Home / Self Care | Payer: BC Managed Care – PPO | Source: Ambulatory Visit | Attending: Hematology and Oncology | Admitting: Hematology and Oncology

## 2019-10-15 DIAGNOSIS — Z853 Personal history of malignant neoplasm of breast: Secondary | ICD-10-CM

## 2019-10-30 ENCOUNTER — Ambulatory Visit (INDEPENDENT_AMBULATORY_CARE_PROVIDER_SITE_OTHER): Payer: BC Managed Care – PPO | Admitting: Family Medicine

## 2019-10-30 ENCOUNTER — Other Ambulatory Visit: Payer: Self-pay

## 2019-10-30 ENCOUNTER — Encounter (INDEPENDENT_AMBULATORY_CARE_PROVIDER_SITE_OTHER): Payer: Self-pay | Admitting: Family Medicine

## 2019-10-30 VITALS — BP 124/76 | HR 81 | Temp 98.1°F | Ht 62.0 in | Wt 162.0 lb

## 2019-10-30 DIAGNOSIS — E669 Obesity, unspecified: Secondary | ICD-10-CM

## 2019-10-30 DIAGNOSIS — Z6830 Body mass index (BMI) 30.0-30.9, adult: Secondary | ICD-10-CM

## 2019-10-30 DIAGNOSIS — E7849 Other hyperlipidemia: Secondary | ICD-10-CM | POA: Diagnosis not present

## 2019-10-30 DIAGNOSIS — E8881 Metabolic syndrome: Secondary | ICD-10-CM | POA: Diagnosis not present

## 2019-10-30 DIAGNOSIS — Z9189 Other specified personal risk factors, not elsewhere classified: Secondary | ICD-10-CM

## 2019-10-30 DIAGNOSIS — E559 Vitamin D deficiency, unspecified: Secondary | ICD-10-CM | POA: Diagnosis not present

## 2019-10-31 LAB — INSULIN, RANDOM: INSULIN: 11.3 u[IU]/mL (ref 2.6–24.9)

## 2019-10-31 LAB — LIPID PANEL WITH LDL/HDL RATIO
Cholesterol, Total: 266 mg/dL — ABNORMAL HIGH (ref 100–199)
HDL: 67 mg/dL (ref >39)
LDL Chol Calc (NIH): 170 mg/dL — ABNORMAL HIGH (ref 0–99)
LDL/HDL Ratio: 2.5 ratio (ref 0.0–3.2)
Triglycerides: 159 mg/dL — ABNORMAL HIGH (ref 0–149)
VLDL Cholesterol Cal: 29 mg/dL (ref 5–40)

## 2019-10-31 LAB — COMPREHENSIVE METABOLIC PANEL
ALT: 25 IU/L (ref 0–32)
AST: 23 IU/L (ref 0–40)
Albumin/Globulin Ratio: 2 (ref 1.2–2.2)
Albumin: 4.8 g/dL (ref 3.8–4.9)
Alkaline Phosphatase: 84 IU/L (ref 44–121)
BUN/Creatinine Ratio: 23 (ref 9–23)
BUN: 15 mg/dL (ref 6–24)
Bilirubin Total: 0.4 mg/dL (ref 0.0–1.2)
CO2: 27 mmol/L (ref 20–29)
Calcium: 9.9 mg/dL (ref 8.7–10.2)
Chloride: 104 mmol/L (ref 96–106)
Creatinine, Ser: 0.64 mg/dL (ref 0.57–1.00)
GFR calc Af Amer: 115 mL/min/{1.73_m2} (ref >59)
GFR calc non Af Amer: 99 mL/min/{1.73_m2} (ref >59)
Globulin, Total: 2.4 g/dL (ref 1.5–4.5)
Glucose: 87 mg/dL (ref 65–99)
Potassium: 5.1 mmol/L (ref 3.5–5.2)
Sodium: 145 mmol/L — ABNORMAL HIGH (ref 134–144)
Total Protein: 7.2 g/dL (ref 6.0–8.5)

## 2019-10-31 LAB — HEMOGLOBIN A1C
Est. average glucose Bld gHb Est-mCnc: 100 mg/dL
Hgb A1c MFr Bld: 5.1 % (ref 4.8–5.6)

## 2019-10-31 LAB — VITAMIN D 25 HYDROXY (VIT D DEFICIENCY, FRACTURES): Vit D, 25-Hydroxy: 53.7 ng/mL (ref 30.0–100.0)

## 2019-10-31 NOTE — Progress Notes (Signed)
Chief Complaint:   OBESITY Jenna [name] is here to discuss her progress with her obesity treatment plan along with follow-up of her obesity related diagnoses. Jenna [name] is on the Category 3 Plan and states she is following her eating plan approximately 100% of the time. Jenna [name] states she is biking for 30 minutes and walking 3 miles 3-4 times per week.  Today's visit was #: 10 Starting weight: 174 lbs Starting date: 04/18/2019 Today's weight: 162 lbs Today's date: 10/30/2019 Total lbs lost to date: 12 Total lbs lost since last in-office visit: 0  Interim History: Jenna [name] is feeling frustrated with 1 lb weight gain. Getting in 8 oz at dinner. Snack calorie wise she is eating pita squares, laughing co cheese, celery, popcorn (individual 100 calories bag). Not necessarily getting all snack calories in.  Subjective:   1. Other hyperlipidemia Jenna [name]'s FLP on 04/18/2019 was showing LDL 133, triglycerides 150, and HDL 66. She is not on statin.  2. Vitamin D deficiency Jenna [name] denies nausea, vomiting, or muscle weakness, but notes fatigue. Last Vit D level was 44.1.  3. Insulin resistance Jenna [name]'s last A1c was 5.2 and insulin 11.3. She has been following Category 3.  4. At risk for heart disease Jenna [name] is at a higher than average risk for cardiovascular disease due to obesity.   Assessment/Plan:   1. Other hyperlipidemia Cardiovascular risk and specific lipid/LDL goals reviewed. We discussed several lifestyle modifications today. Zaylah will continue to work on diet, exercise and weight loss efforts. We will check labs today. Orders and follow up as documented in patient record.   Counseling Intensive lifestyle modifications are the first line treatment for this issue. . Dietary changes: Increase soluble fiber. Decrease simple carbohydrates. . Exercise changes: Moderate to vigorous-intensity aerobic activity 150 minutes per week if tolerated. . Lipid-lowering medications:  see documented in medical record.  - Lipid Panel With LDL/HDL Ratio  2. Vitamin D deficiency Low Vitamin D level contributes to fatigue and are associated with obesity, breast, and colon cancer. We will check labs today, and Jynesis will follow-up for routine testing of Vitamin D, at least 2-3 times per year to avoid over-replacement.  - VITAMIN D 25 Hydroxy (Vit-D Deficiency, Fractures)  3. Insulin resistance Jenna [name] will continue to work on weight loss, exercise, and decreasing simple carbohydrates to help decrease the risk of diabetes. We will check labs today. Jenna [name] agreed to follow-up with Jenna [name] as directed to closely monitor her progress.  - Comprehensive metabolic panel - Hemoglobin A1c - Insulin, random  4. At risk for heart disease Jenna [name] was given approximately 15 minutes of coronary artery disease prevention counseling today. She is 59 y.o. female and has risk factors for heart disease including obesity. We discussed intensive lifestyle modifications today with an emphasis on specific weight loss instructions and strategies.   Repetitive spaced learning was employed today to elicit superior memory formation and behavioral change.  5. Class 1 obesity with serious comorbidity and body mass index (BMI) of 30.0 to 30.9 in adult, unspecified obesity type Jenna [name] is currently in the action stage of change. As such, her goal is to continue with weight loss efforts. She has agreed to the Category 3 Plan + 100 calories.   Exercise goals: As is.  Behavioral modification strategies: increasing lean protein intake, meal planning and cooking strategies, keeping healthy foods in the home and planning for success.  Jenna [name] has agreed to follow-up with our clinic in 2 weeks. She was informed of the importance of frequent  follow-up visits to maximize her success with intensive lifestyle modifications for her multiple health conditions.   Jenna [name] was informed we would discuss her lab  results at her next visit unless there is a critical issue that needs to be addressed sooner. Jenna [name] agreed to keep her next visit at the agreed upon time to discuss these results.  Objective:   Blood pressure 124/76, pulse 81, temperature 98.1 F (36.7 C), temperature source Oral, height 5\' 2"  (1.575 m), weight 162 lb (73.5 kg), SpO2 96 %. Body mass index is 29.63 kg/m.  General: Cooperative, alert, well developed, in no acute distress. HEENT: Conjunctivae and lids unremarkable. Cardiovascular: Regular rhythm.  Lungs: Normal work of breathing. Neurologic: No focal deficits.   Lab Results  Component Value Date   CREATININE 0.64 10/30/2019   BUN 15 10/30/2019   NA 145 (H) 10/30/2019   K 5.1 10/30/2019   CL 104 10/30/2019   CO2 27 10/30/2019   Lab Results  Component Value Date   ALT 25 10/30/2019   AST 23 10/30/2019   ALKPHOS 84 10/30/2019   BILITOT 0.4 10/30/2019   Lab Results  Component Value Date   HGBA1C 5.1 10/30/2019   HGBA1C 5.2 04/18/2019   Lab Results  Component Value Date   INSULIN 11.3 10/30/2019   INSULIN 11.3 04/18/2019   Lab Results  Component Value Date   TSH 2.36 06/21/2019   Lab Results  Component Value Date   CHOL 266 (H) 10/30/2019   HDL 67 10/30/2019   LDLCALC 170 (H) 10/30/2019   TRIG 159 (H) 10/30/2019   CHOLHDL 3.2 06/10/2017   Lab Results  Component Value Date   WBC 6.2 04/18/2019   HGB 14.5 04/18/2019   HCT 44.5 04/18/2019   MCV 91 04/18/2019   PLT 284 04/18/2019   No results found for: IRON, TIBC, FERRITIN  Attestation Statements:   Reviewed by clinician on day of visit: allergies, medications, problem list, medical history, surgical history, family history, social history, and previous encounter notes.   I, Trixie Dredge, am acting as transcriptionist for Coralie Common, MD.  I have reviewed the above documentation for accuracy and completeness, and I agree with the above. - Jinny Blossom, MD

## 2019-11-09 ENCOUNTER — Encounter: Payer: Self-pay | Admitting: Internal Medicine

## 2019-11-12 ENCOUNTER — Ambulatory Visit: Payer: BC Managed Care – PPO | Admitting: Internal Medicine

## 2019-11-12 ENCOUNTER — Encounter: Payer: Self-pay | Admitting: Internal Medicine

## 2019-11-12 ENCOUNTER — Other Ambulatory Visit: Payer: Self-pay

## 2019-11-12 VITALS — BP 130/88 | HR 87 | Ht 62.0 in | Wt 166.8 lb

## 2019-11-12 DIAGNOSIS — E038 Other specified hypothyroidism: Secondary | ICD-10-CM | POA: Diagnosis not present

## 2019-11-12 DIAGNOSIS — E063 Autoimmune thyroiditis: Secondary | ICD-10-CM

## 2019-11-12 LAB — TSH: TSH: 3.22 u[IU]/mL (ref 0.35–4.50)

## 2019-11-12 LAB — T4, FREE: Free T4: 0.77 ng/dL (ref 0.60–1.60)

## 2019-11-12 LAB — T3, FREE: T3, Free: 2.8 pg/mL (ref 2.3–4.2)

## 2019-11-12 NOTE — Patient Instructions (Signed)
Please continue: - LT4 88 mcg daily - LT3 5 mcg daily   Take the thyroid hormone every day, with water, at least 30 minutes before breakfast, separated by at least 4 hours from: - acid reflux medications - calcium - iron - multivitamins  Please stop at the lab.  Please come back for a follow-up appointment in 6 months.

## 2019-11-12 NOTE — Progress Notes (Signed)
Patient ID: Jenna [name], female   DOB: Mar 13, 1960, 59 y.o.   MRN: [id number]   This visit occurred during the SARS-CoV-2 public health emergency.  Safety protocols were in place, including screening questions prior to the visit, additional usage of staff PPE, and extensive cleaning of exam room while observing appropriate contact time as indicated for disinfecting solutions.   HPI  Jenna [name] is a 59 y.o.-year-old female, referred by Dr. Adair Patter, for management of Hashimoto's hypothyroidism.  Last visit 6 months ago.  Reviewed and addended history: She was diagnosed with Hashimoto's hypothyroidism in 2015-2014 after she described lethargy, inability to lose weight, hair loss.  She was started on Levothyroxine (LT4) initially, but quickly changed to liothyronine (LT3) 50 mcg daily >> then changed to 25 mcg daily in 04/2019 (equivalent of 100 mcg levothyroxine daily).  On this regimen, her TSH was suppressed.  In 05/2019, we changed to: - LT4 50 mcg daily - LT3 12.5 mcg daily For an equivalent of LT4 of 100 mcg daily  In 06/2019, we changed to: - LT4 88 mcg daily - LT3 5 mcg daily For an equivalent of LT4 of 108 mcg daily  She feels much better after we started to decrease her LT3 dose!  She continues on the above regimen: - previously at night (2-3 hours after dinner) >> now in am - dinner at 6 pm - black coffee - + Calcium - at night - + MVI - at night - prev.  On omeprazole twice a day, now off - prev.  On biotin, now off  I reviewed her TFTs: Lab Results  Component Value Date   TSH 2.36 06/21/2019   TSH 2.15 05/14/2019   TSH 0.018 (L) 04/18/2019   TSH 6.24 (H) 12/08/2018   TSH 0.43 06/10/2017   TSH 7.16 (H) 07/16/2016   TSH 0.10 (L) 08/12/2015   TSH 1.885 10/28/2014   TSH 1.39 05/02/2014   FREET4 0.77 06/21/2019   FREET4 0.21 (L) 05/14/2019   FREET4 0.20 (L) 04/18/2019   FREET4 1.2 08/12/2015    Her thyroid antibodies were elevated: Component      Latest Ref Rng & Units 05/14/2019  Thyroperoxidase Ab SerPl-aCnc     <9 IU/mL 379 (H)  Thyroglobulin Ab     < or = 1 IU/mL 13 (H)  Previously: 08/12/2015: ATA 14 (<2), TPO 162 ( (<9)  In 05/2019, I advised her to start selenium 200 mcg daily.  At last visit, she described: - no weight gain, but difficult to lose weight - + fatigue - + heat intolerance - no constipation - + dry skin - + hair loss - + depression, + anxiety  At this visit, she denies: fatigue,  heat intolerance.   20 okay she still has hair loss and some depression and anxiety  Pt denies: - feeling nodules in neck - hoarseness - dysphagia - choking - SOB with lying down But she does have the need to clear her voice and some postnasal drip.  No FH of thyroid cancer. + FH of autoimmune ds.: sister and brother. Father with SLE. P cousins with Hashimoto's thyroiditis. No h/o radiation tx to head or neck. No herbal supplements. No Biotin use. No recent steroids use.   Pt. also has a history of breast cancer.  She is on anastrozole.  She walks and swims for exercise.  ROS: Constitutional: no weight gain/no weight loss, resolved fatigue, resolved subjective hyperthermia, no subjective hypothermia Eyes: no blurry vision, no xerophthalmia ENT:  no sore throat, + see HPI Cardiovascular: no CP/no SOB/no palpitations/no leg swelling Respiratory: no cough/no SOB/no wheezing Gastrointestinal: no N/no V/no D/no C/+ acid reflux Musculoskeletal: no muscle aches/+ joint aches Skin: no rashes, + hair loss Neurological: no tremors/no numbness/no tingling/no dizziness  I reviewed pt's medications, allergies, PMH, social hx, family hx, and changes were documented in the history of present illness. Otherwise, unchanged from my initial visit note.  Past Medical History:  Diagnosis Date  . Anxiety   . Asthma   . Breast cancer ([place]) 2017   Left Breast Cancer  . Breast cancer of lower-outer quadrant of left female breast ([place])  09/12/2015  . Complication of anesthesia    hard time waking up with gallbladder  . Depression   . Gallbladder problem   . GERD (gastroesophageal reflux disease)   . Hashimoto's disease   . Heartburn   . History of external beam radiation therapy 03/10/16-04/23/16   left breast/50.4 Gy in 28 fractions, left breast boost/10 Gy in 5 fractions  . Hypothyroidism   . Irritable bowel   . Joint pain   . Personal history of chemotherapy 2017-2018   Left Breast Cancer  . Personal history of radiation therapy 2018   Left Breast Cancer  . Uterine displacement    Past Surgical History:  Procedure Laterality Date  . BACK SURGERY    . BREAST BIOPSY Left 09/10/2015  . BREAST LUMPECTOMY Left 09/25/2015  . BREAST LUMPECTOMY WITH NEEDLE LOCALIZATION AND AXILLARY SENTINEL LYMPH NODE BX Left 09/25/2015   Procedure: BREAST LUMPECTOMY WITH NEEDLE LOCALIZATION X'S 2 AND AXILLARY SENTINEL LYMPH NODE BX;  Surgeon: Erroll Luna, MD;  Location: [facility];  Service: General;  Laterality: Left;  . CHOLECYSTECTOMY OPEN    . COLONOSCOPY WITH PROPOFOL N/A 04/13/2019   Procedure: COLONOSCOPY WITH PROPOFOL;  Surgeon: Jonathon Bellows, MD;  Location[facility] ENDOSCOPY;  Service: Gastroenterology;  Laterality: N/A;  . disk fusion    . ENDOMETRIAL ABLATION    . MASTOPEXY Bilateral 10/25/2016   Procedure: BILATERAL MASTOPEXY;  Surgeon: Irene Limbo, MD;  Location: [facility];  Service: Plastics;  Laterality: Bilateral;  . neck fusion    . PORT-A-CATH REMOVAL Right 10/25/2016   Procedure: RIGHT CHEST PORT REMOVAL;  Surgeon: Irene Limbo, MD;  Location: [facility];  Service: Plastics;  Laterality: Right;  . PORTACATH PLACEMENT Right 09/25/2015   Procedure: INSERTION PORT-A-CATH;  Surgeon: Erroll Luna, MD;  Location: [facility];  Service: General;  Laterality: Right;  . REDUCTION MAMMAPLASTY Bilateral 2018   Social History   Socioeconomic History  .  Marital status: Married    Spouse name: Not on file  . Number of children: 2  . Years of education: Not on file  . Highest education level: Not on file  Occupational History  . Occupation: n/a  Tobacco Use  . Smoking status: Former Smoker    Years: 15.00    Quit date: 1986    Years since quitting: 35.8  . Smokeless tobacco: Never Used  Vaping Use  . Vaping Use: Never used  Substance and Sexual Activity  . Alcohol use: Yes    Alcohol/week: 2.0 - 3.0 standard drinks    Types: 2 - 3 Standard drinks or equivalent per week    Comment: social  . Drug use: No  . Sexual activity: Yes    Partners: Male  Other Topics Concern  . Not on file  Social History Narrative  . Not on file  Social Determinants of Health   Financial Resource Strain:   . Difficulty of Paying Living Expenses: Not on file  Food Insecurity:   . Worried About Charity fundraiser in the Last Year: Not on file  . Ran Out of Food in the Last Year: Not on file  Transportation Needs:   . Lack of Transportation (Medical): Not on file  . Lack of Transportation (Non-Medical): Not on file  Physical Activity:   . Days of Exercise per Week: Not on file  . Minutes of Exercise per Session: Not on file  Stress:   . Feeling of Stress : Not on file  Social Connections:   . Frequency of Communication with Friends and Family: Not on file  . Frequency of Social Gatherings with Friends and Family: Not on file  . Attends Religious Services: Not on file  . Active Member of Clubs or Organizations: Not on file  . Attends Archivist Meetings: Not on file  . Marital Status: Not on file  Intimate Partner Violence:   . Fear of Current or Ex-Partner: Not on file  . Emotionally Abused: Not on file  . Physically Abused: Not on file  . Sexually Abused: Not on file   Current Outpatient Medications on File Prior to Visit  Medication Sig Dispense Refill  . acetaminophen (TYLENOL) 500 MG tablet Take 500 mg by mouth every 6  (six) hours as needed.    Marland Kitchen anastrozole (ARIMIDEX) 1 MG tablet Take 1 tablet (1 mg total) by mouth daily. 90 tablet 3  . busPIRone (BUSPAR) 7.5 MG tablet Take 1 tablet (7.5 mg total) by mouth 3 (three) times daily. 90 tablet 0  . Calcium Carbonate-Vit D-Min (CALCIUM 1200 PO) Take by mouth.    . Cholecalciferol (VITAMIN D3) 1000 units CAPS Take 1,000 Int'l Units by mouth. 2 capsules per day    . DULoxetine (CYMBALTA) 60 MG capsule Take 1 capsule (60 mg total) by mouth daily. 90 capsule 3  . levothyroxine (SYNTHROID) 88 MCG tablet Take 1 tablet (88 mcg total) by mouth daily. 45 tablet 3  . liothyronine (CYTOMEL) 5 MCG tablet Take 1 tablet (5 mcg total) by mouth daily. 45 tablet 3  . Magnesium 500 MG CAPS Take 500 mg by mouth.    . Multiple Vitamin (MULTIVITAMIN) tablet Take 1 tablet by mouth daily.    . Multiple Vitamins-Minerals (OPTIC-VITES PO) Take by mouth.    . Selenium 200 MCG CAPS Take by mouth.    . Simethicone (GAS-X PO) Take by mouth.    . Vitamin D, Ergocalciferol, (DRISDOL) 1.25 MG (50000 UNIT) CAPS capsule Take 1 capsule (50,000 Units total) by mouth every 14 (fourteen) days. 6 capsule 0  . Vitamin D, Ergocalciferol, (DRISDOL) 1.25 MG (50000 UNIT) CAPS capsule Take 1 capsule (50,000 Units total) by mouth every 14 (fourteen) days. 4 capsule 0   No current facility-administered medications on file prior to visit.   Allergies  Allergen Reactions  . Latex Rash  . Sulfa Antibiotics Rash   Family History  Problem Relation Age of Onset  . Diabetes Mother   . Hypertension Mother   . Hyperlipidemia Mother   . Kidney disease Mother   . Obesity Mother   . Cancer Maternal Grandfather   . Obesity Father   . Thyroid disease Other   Also, please see HPI. HTN in oldest brother.  PE: BP 130/88   Pulse 87   Ht 5\' 2"  (1.575 m)   Wt 166 lb 12.8  oz (75.7 kg)   SpO2 96%   BMI 30.51 kg/m  Wt Readings from Last 3 Encounters:  11/12/19 166 lb 12.8 oz (75.7 kg)  10/30/19 162 lb (73.5  kg)  10/09/19 161 lb (73 kg)   Constitutional: overweight, in NAD Eyes: PERRLA, EOMI, no exophthalmos ENT: moist mucous membranes, no thyromegaly, no cervical lymphadenopathy Cardiovascular: RRR, No MRG Respiratory: CTA B Gastrointestinal: abdomen soft, NT, ND, BS+ Musculoskeletal: no deformities, strength intact in all 4 Skin: moist, warm, no rashes Neurological: no tremor with outstretched hands, DTR normal in all 4  ASSESSMENT: 1.  Hypothyroidism due to Hashimoto's thyroiditis  PLAN: 1.  - Patient with history of Hashimoto's thyroiditis with subsequent hypothyroidism, with replacement only with LT3 at last visit.  She has been on this for a long time, with the dose reduced before last visit from 50 mcg daily (equivalent LT4 dose of 200 mcg daily) to 25 mcg daily (equivalent LT4 dose of 100 mcg daily).  Her TSH was suppressed on this dose. We discussed about side effects of using only liothyronine including sleeping problems, hot flashes, stimulatory effect on the heart and also nervous system, and also osteoporosis long-term, especially if the TSH is suppressed.  We decided to decrease the dose of LT3 slowly while increasing the dose of LT4.  Last dose change was in 06/2019. -At last visit we also checked her TPO and ATA antibodies and they were both elevated so I suggested to start selenium 200 mcg daily to decrease these titers - latest thyroid labs reviewed with pt >> normal: Lab Results  Component Value Date   TSH 2.36 06/21/2019   - she continues on LT4 88 mcg daily + LT3 5 mcg daily (equivalent of 108 mcg daily LT4) - pt feels great on this dose. Lost a net 7 lbs since last OV per our scale, but more by her scale at home. - we discussed about taking the thyroid hormone every day, with water, >30 minutes before breakfast, separated by >4 hours from acid reflux medications, calcium, iron, multivitamins. Pt. is taking it correctly now, since last visit, she moved thyroid hormones to  morning. - will check thyroid tests today: TSH, free T3 and fT4 and also her TPO and ATA antibodies since she is on selenium - After the results are back, she would be interested to continue to reduce the of the LT3 dose - If labs are abnormal, she will need to return for repeat TFTs in 1.5 months - OTW, I will see her back in 6 months  Component     Latest Ref Rng & Units 11/12/2019  Thyroperoxidase Ab SerPl-aCnc     <9 IU/mL 335 (H)  Thyroglobulin Ab     < or = 1 IU/mL 16 (H)  T4,Free(Direct)     0.60 - 1.60 ng/dL 0.77  TSH     0.35 - 4.50 uIU/mL 3.22  Triiodothyronine,Free,Serum     2.3 - 4.2 pg/mL 2.8  Thyroid antibodies are still elevated.  TPO antibodies are slightly low otherwise ATA antibodies are slightly higher.  We can stop selenium for now. Will try to to change her regimen as follows: LT4 100 mcg daily + LT3 2.5 mcg daily (equivalent of 110 mcg daily LT4) We will recheck her TFTs in 1.5 months.  Philemon Kingdom, MD PhD [facility] Endocrinology

## 2019-11-13 ENCOUNTER — Encounter: Payer: Self-pay | Admitting: Internal Medicine

## 2019-11-13 LAB — THYROID PEROXIDASE ANTIBODY: Thyroperoxidase Ab SerPl-aCnc: 335 IU/mL — ABNORMAL HIGH (ref <9)

## 2019-11-13 LAB — THYROGLOBULIN ANTIBODY: Thyroglobulin Ab: 16 IU/mL — ABNORMAL HIGH (ref <=1)

## 2019-11-13 MED ORDER — LIOTHYRONINE SODIUM 5 MCG PO TABS: 2.5000 ug | ORAL_TABLET | Freq: Every day | ORAL | 3 refills | Status: DC

## 2019-11-13 MED ORDER — LEVOTHYROXINE SODIUM 100 MCG PO TABS
100.0000 ug | ORAL_TABLET | Freq: Every day | ORAL | 3 refills | Status: DC
Start: 2019-11-13 — End: 2020-05-13

## 2019-11-14 ENCOUNTER — Ambulatory Visit (INDEPENDENT_AMBULATORY_CARE_PROVIDER_SITE_OTHER): Payer: BC Managed Care – PPO | Admitting: Family Medicine

## 2019-11-14 ENCOUNTER — Other Ambulatory Visit: Payer: Self-pay

## 2019-11-14 ENCOUNTER — Other Ambulatory Visit: Payer: Self-pay | Admitting: Internal Medicine

## 2019-11-14 ENCOUNTER — Encounter (INDEPENDENT_AMBULATORY_CARE_PROVIDER_SITE_OTHER): Payer: Self-pay | Admitting: Family Medicine

## 2019-11-14 VITALS — BP 130/80 | HR 74 | Temp 98.3°F | Ht 62.0 in | Wt 162.0 lb

## 2019-11-14 DIAGNOSIS — E669 Obesity, unspecified: Secondary | ICD-10-CM | POA: Diagnosis not present

## 2019-11-14 DIAGNOSIS — F419 Anxiety disorder, unspecified: Secondary | ICD-10-CM

## 2019-11-14 DIAGNOSIS — Z6830 Body mass index (BMI) 30.0-30.9, adult: Secondary | ICD-10-CM

## 2019-11-14 DIAGNOSIS — F32A Depression, unspecified: Secondary | ICD-10-CM | POA: Diagnosis not present

## 2019-11-14 DIAGNOSIS — Z9189 Other specified personal risk factors, not elsewhere classified: Secondary | ICD-10-CM | POA: Diagnosis not present

## 2019-11-14 DIAGNOSIS — E559 Vitamin D deficiency, unspecified: Secondary | ICD-10-CM

## 2019-11-14 DIAGNOSIS — E038 Other specified hypothyroidism: Secondary | ICD-10-CM

## 2019-11-14 MED ORDER — LIOTHYRONINE SODIUM 5 MCG PO TABS: 2.5000 ug | ORAL_TABLET | Freq: Every day | ORAL | 3 refills | Status: DC

## 2019-11-14 MED ORDER — DULOXETINE HCL 60 MG PO CPEP: 60.0000 mg | ORAL_CAPSULE | Freq: Every day | ORAL | 3 refills | Status: DC

## 2019-11-14 MED ORDER — VITAMIN D (ERGOCALCIFEROL) 1.25 MG (50000 UNIT) PO CAPS: 50000.0000 [IU] | ORAL_CAPSULE | ORAL | 0 refills | Status: DC

## 2019-11-15 NOTE — Progress Notes (Signed)
Chief Complaint:   OBESITY Jenna [name] is here to discuss her progress with her obesity treatment plan along with follow-up of her obesity related diagnoses. Jenna [name] is on the Category 3 Plan + 100 calories and states she is following her eating plan approximately 98% of the time. Jenna [name] states she is bike riding and walking for 30 minutes 7 times per week, and hiking 6 miles 1 time per week.  Today's visit was #: 11 Starting weight: 174 lbs Starting date: 04/18/2019 Today's weight: 162 lbs Today's date: 11/14/2019 Total lbs lost to date: 12 Total lbs lost since last in-office visit: 0  Interim History: Jenna [name] voices that her thyroid is all out of sorts (she saw Dr. Cruzita Lederer 2 days ago). She leaves in 1 week to be gone until March. She realizes that she hasn't eaten enough. She does reports some anxiety with her upcoming travel.   Subjective:   1. Vitamin D deficiency Jenna [name] denies nausea, vomiting, or muscle weakness, but notes fatigue. She is on prescription Vit D. Last Vit D level was 53.7.  2. Anxiety and depression Jenna [name] denies suicidal or homicidal ideas. She is on Cymbalta, and she notes some anxiety with her upcoming travel.  3. At risk for anxiety Jenna [name] is at risk of developing anxiety due to stress, personal and or family history or current situation.  Assessment/Plan:   1. Vitamin D deficiency Low Vitamin D level contributes to fatigue and are associated with obesity, breast, and colon cancer. We will refill prescription Vitamin D for 90 days with no refills. Jenna [name] will follow-up for routine testing of Vitamin D, at least 2-3 times per year to avoid over-replacement.  - Vitamin D, Ergocalciferol, (DRISDOL) 1.25 MG (50000 UNIT) CAPS capsule; Take 1 capsule (50,000 Units total) by mouth every 14 (fourteen) days.  Dispense: 12 capsule; Refill: 0  2. Anxiety and depression Behavior modification techniques were discussed today to help Jenna [name] deal with her  anxiety and depression. We will refill Cymbalta for 90 days with 3 refills. Orders and follow up as documented in patient record.   - DULoxetine (CYMBALTA) 60 MG capsule; Take 1 capsule (60 mg total) by mouth daily.  Dispense: 90 capsule; Refill: 3  3. At risk for anxiety Jenna [name] was given approximately 15 minutes of anxiety risk counseling today. She has risk factors for anxiety. We discussed the importance of a healthy work life balance, a healthy relationship with food and a good support system.  Repetitive spaced learning was employed today to elicit superior memory formation and behavioral change.  4. Class 1 obesity with serious comorbidity and body mass index (BMI) of 30.0 to 30.9 in adult, unspecified obesity type Jenna [name] is currently in the action stage of change. As such, her goal is to continue with weight loss efforts. She has agreed to the Category 3 Plan.   We will repeat her IC in March 2022.  Exercise goals: As is.  Behavioral modification strategies: increasing lean protein intake, meal planning and cooking strategies, keeping healthy foods in the home, dealing with family or coworker sabotage and travel eating strategies.  Jenna [name] has agreed to follow-up with our clinic in 4 months. She was informed of the importance of frequent follow-up visits to maximize her success with intensive lifestyle modifications for her multiple health conditions.   Objective:   Blood pressure 130/80, pulse 74, temperature 98.3 F (36.8 C), temperature source Oral, height 5\' 2"  (1.575 m), weight 162 lb (73.5 kg), SpO2 97 %. Body mass  index is 29.63 kg/m.  General: Cooperative, alert, well developed, in no acute distress. HEENT: Conjunctivae and lids unremarkable. Cardiovascular: Regular rhythm.  Lungs: Normal work of breathing. Neurologic: No focal deficits.   Lab Results  Component Value Date   CREATININE 0.64 10/30/2019   BUN 15 10/30/2019   NA 145 (H) 10/30/2019   K 5.1  10/30/2019   CL 104 10/30/2019   CO2 27 10/30/2019   Lab Results  Component Value Date   ALT 25 10/30/2019   AST 23 10/30/2019   ALKPHOS 84 10/30/2019   BILITOT 0.4 10/30/2019   Lab Results  Component Value Date   HGBA1C 5.1 10/30/2019   HGBA1C 5.2 04/18/2019   Lab Results  Component Value Date   INSULIN 11.3 10/30/2019   INSULIN 11.3 04/18/2019   Lab Results  Component Value Date   TSH 3.22 11/12/2019   Lab Results  Component Value Date   CHOL 266 (H) 10/30/2019   HDL 67 10/30/2019   LDLCALC 170 (H) 10/30/2019   TRIG 159 (H) 10/30/2019   CHOLHDL 3.2 06/10/2017   Lab Results  Component Value Date   WBC 6.2 04/18/2019   HGB 14.5 04/18/2019   HCT 44.5 04/18/2019   MCV 91 04/18/2019   PLT 284 04/18/2019   No results found for: IRON, TIBC, FERRITIN  Attestation Statements:   Reviewed by clinician on day of visit: allergies, medications, problem list, medical history, surgical history, family history, social history, and previous encounter notes.   I, Trixie Dredge, am acting as transcriptionist for Coralie Common, MD.  I have reviewed the above documentation for accuracy and completeness, and I agree with the above. - Jinny Blossom, MD

## 2020-02-14 ENCOUNTER — Encounter (INDEPENDENT_AMBULATORY_CARE_PROVIDER_SITE_OTHER): Payer: Self-pay | Admitting: Family Medicine

## 2020-02-14 NOTE — Telephone Encounter (Signed)
Please advise 

## 2020-03-19 ENCOUNTER — Ambulatory Visit (INDEPENDENT_AMBULATORY_CARE_PROVIDER_SITE_OTHER): Payer: BC Managed Care – PPO | Admitting: Family Medicine

## 2020-03-24 ENCOUNTER — Encounter (INDEPENDENT_AMBULATORY_CARE_PROVIDER_SITE_OTHER): Payer: Self-pay | Admitting: Family Medicine

## 2020-03-24 ENCOUNTER — Ambulatory Visit (INDEPENDENT_AMBULATORY_CARE_PROVIDER_SITE_OTHER): Payer: BC Managed Care – PPO | Admitting: Family Medicine

## 2020-03-24 ENCOUNTER — Other Ambulatory Visit: Payer: Self-pay

## 2020-03-24 VITALS — BP 146/83 | HR 79 | Temp 98.2°F | Ht 62.0 in | Wt 166.0 lb

## 2020-03-24 DIAGNOSIS — E7849 Other hyperlipidemia: Secondary | ICD-10-CM | POA: Diagnosis not present

## 2020-03-24 DIAGNOSIS — E559 Vitamin D deficiency, unspecified: Secondary | ICD-10-CM

## 2020-03-24 DIAGNOSIS — R0602 Shortness of breath: Secondary | ICD-10-CM

## 2020-03-24 DIAGNOSIS — E669 Obesity, unspecified: Secondary | ICD-10-CM

## 2020-03-24 DIAGNOSIS — E8881 Metabolic syndrome: Secondary | ICD-10-CM | POA: Diagnosis not present

## 2020-03-24 DIAGNOSIS — Z6830 Body mass index (BMI) 30.0-30.9, adult: Secondary | ICD-10-CM

## 2020-03-24 DIAGNOSIS — Z9189 Other specified personal risk factors, not elsewhere classified: Secondary | ICD-10-CM

## 2020-03-25 LAB — LIPID PANEL WITH LDL/HDL RATIO
Cholesterol, Total: 294 mg/dL — ABNORMAL HIGH (ref 100–199)
HDL: 80 mg/dL (ref >39)
LDL Chol Calc (NIH): 188 mg/dL — ABNORMAL HIGH (ref 0–99)
LDL/HDL Ratio: 2.4 ratio (ref 0.0–3.2)
Triglycerides: 144 mg/dL (ref 0–149)
VLDL Cholesterol Cal: 26 mg/dL (ref 5–40)

## 2020-03-25 LAB — INSULIN, RANDOM: INSULIN: 9.5 u[IU]/mL (ref 2.6–24.9)

## 2020-03-25 LAB — HEMOGLOBIN A1C
Est. average glucose Bld gHb Est-mCnc: 103 mg/dL
Hgb A1c MFr Bld: 5.2 % (ref 4.8–5.6)

## 2020-03-25 LAB — VITAMIN D 25 HYDROXY (VIT D DEFICIENCY, FRACTURES): Vit D, 25-Hydroxy: 38.2 ng/mL (ref 30.0–100.0)

## 2020-03-25 NOTE — Progress Notes (Signed)
Chief Complaint:   OBESITY Jenna [name] is here to discuss her progress with her obesity treatment plan along with follow-up of her obesity related diagnoses. Jenna [name] is on the Category 2 Plan and states she is following her eating plan approximately 50% of the time. Jenna [name] states she is walking as mucha s possible.  Today's visit was #: 12 Starting weight: 174 lbs Starting date: 04/18/2019 Today's weight: 166 lbs Today's date: 03/24/2020 Total lbs lost to date: 8 lbs Total lbs lost since last in-office visit: 0  Interim History: Jenna [name] had a difficult trip out to the [employer] to take care of her aunt and mother. She has been home about 3 weeks and feels her head is back in the game. She wasn't able to eat much when she was away due to lack of available time.  Subjective:   1. SOB (shortness of breath) Jenna [name]'s symptoms are unchanged from previous. Her last RMR was 1890 (IC today is showing REE of 1824).   2. Vitamin D deficiency Jenna [name] denies nausea, vomiting, and muscle weakness but notes fatigue. Pt was previously on prescription Vit D.  3. Insulin resistance Jenna [name]'s last A1c was 5.1, with an insulin level of 11.3. She is not on medication.   4. Other hyperlipidemia Jenna [name]'s last fasting lipid panel showed LDL 170, HDL 67, and triglycerides 159. She is not on statin therapy.  5. At risk for osteoporosis Jenna [name] is at higher risk of osteopenia and osteoporosis due to Vitamin D deficiency.   Assessment/Plan:   1. SOB (shortness of breath) [name] does feel that she gets out of breath more easily that she used to when she exercises. [name]'s shortness of breath appears to be obesity related and exercise induced. She has agreed to work on weight loss and gradually increase exercise to treat her exercise induced shortness of breath. Will continue to monitor closely.  2. Vitamin D deficiency Low Vitamin D level contributes to fatigue and are associated with  obesity, breast, and colon cancer. She agrees to follow-up for routine testing of Vitamin D, at least 2-3 times per year to avoid over-replacement. Check labs today.  - VITAMIN D 25 Hydroxy (Vit-D Deficiency, Fractures)  3. Insulin resistance [name] will continue to work on weight loss, exercise, and decreasing simple carbohydrates to help decrease the risk of diabetes. [name] agreed to follow-up with [place] as directed to closely monitor her progress. Check labs today.  - Hemoglobin A1c - Insulin, random  4. Other hyperlipidemia Cardiovascular risk and specific lipid/LDL goals reviewed.  We discussed several lifestyle modifications today and [name] will continue to work on diet, exercise and weight loss efforts. Orders and follow up as documented in patient record. Check labs today.  Counseling Intensive lifestyle modifications are the first line treatment for this issue. . Dietary changes: Increase soluble fiber. Decrease simple carbohydrates. . Exercise changes: Moderate to vigorous-intensity aerobic activity 150 minutes per week if tolerated. . Lipid-lowering medications: see documented in medical record.  - Lipid Panel With LDL/HDL Ratio  5. At risk for osteoporosis Jenna [name] was given approximately 15 minutes of osteoporosis prevention counseling today. Jenna [name] is at risk for osteopenia and osteoporosis due to her Vitamin D deficiency. She was encouraged to take her Vitamin D and follow her higher calcium diet and increase strengthening exercise to help strengthen her bones and decrease her risk of osteopenia and osteoporosis.  Repetitive spaced learning was employed today to elicit superior memory formation and behavioral change.  6. Class 1 obesity  with serious comorbidity and body mass index (BMI) of 30.0 to 30.9 in adult, unspecified obesity type Jenna [name] is currently in the action stage of change. As such, her goal is to continue with weight loss efforts. She has agreed to the  Category 3 Plan.   Exercise goals: All adults should avoid inactivity. Some physical activity is better than none, and adults who participate in any amount of physical activity gain some health benefits.  Behavioral modification strategies: increasing lean protein intake, meal planning and cooking strategies, keeping healthy foods in the home and planning for success.  Jenna [name] has agreed to follow-up with our clinic in 2 weeks. She was informed of the importance of frequent follow-up visits to maximize her success with intensive lifestyle modifications for her multiple health conditions.   Jenna [name] was informed we would discuss her lab results at her next visit unless there is a critical issue that needs to be addressed sooner. Jenna [name] agreed to keep her next visit at the agreed upon time to discuss these results.  Objective:   Blood pressure (!) 146/83, pulse 79, temperature 98.2 F (36.8 C), temperature source Oral, height 5\' 2"  (1.575 m), weight 166 lb (75.3 kg), SpO2 98 %. Body mass index is 30.36 kg/m.  General: Cooperative, alert, well developed, in no acute distress. HEENT: Conjunctivae and lids unremarkable. Cardiovascular: Regular rhythm.  Lungs: Normal work of breathing. Neurologic: No focal deficits.   Lab Results  Component Value Date   CREATININE 0.64 10/30/2019   BUN 15 10/30/2019   NA 145 (H) 10/30/2019   K 5.1 10/30/2019   CL 104 10/30/2019   CO2 27 10/30/2019   Lab Results  Component Value Date   ALT 25 10/30/2019   AST 23 10/30/2019   ALKPHOS 84 10/30/2019   BILITOT 0.4 10/30/2019   Lab Results  Component Value Date   HGBA1C 5.2 03/24/2020   HGBA1C 5.1 10/30/2019   HGBA1C 5.2 04/18/2019   Lab Results  Component Value Date   INSULIN WILL FOLLOW 03/24/2020   INSULIN 11.3 10/30/2019   INSULIN 11.3 04/18/2019   Lab Results  Component Value Date   TSH 3.22 11/12/2019   Lab Results  Component Value Date   CHOL 294 (H) 03/24/2020   HDL 80  03/24/2020   LDLCALC 188 (H) 03/24/2020   TRIG 144 03/24/2020   CHOLHDL 3.2 06/10/2017   Lab Results  Component Value Date   WBC 6.2 04/18/2019   HGB 14.5 04/18/2019   HCT 44.5 04/18/2019   MCV 91 04/18/2019   PLT 284 04/18/2019    Attestation Statements:   Reviewed by clinician on day of visit: allergies, medications, problem list, medical history, surgical history, family history, social history, and previous encounter notes.  Coral Ceo, am acting as transcriptionist for Coralie Common, MD.   I have reviewed the above documentation for accuracy and completeness, and I agree with the above. - Jinny Blossom, MD

## 2020-04-10 ENCOUNTER — Other Ambulatory Visit: Payer: Self-pay

## 2020-04-10 ENCOUNTER — Ambulatory Visit (INDEPENDENT_AMBULATORY_CARE_PROVIDER_SITE_OTHER): Payer: BC Managed Care – PPO | Admitting: Family Medicine

## 2020-04-10 ENCOUNTER — Encounter (INDEPENDENT_AMBULATORY_CARE_PROVIDER_SITE_OTHER): Payer: Self-pay | Admitting: Family Medicine

## 2020-04-10 VITALS — BP 129/81 | HR 83 | Temp 98.4°F | Ht 62.0 in | Wt 164.0 lb

## 2020-04-10 DIAGNOSIS — E559 Vitamin D deficiency, unspecified: Secondary | ICD-10-CM | POA: Diagnosis not present

## 2020-04-10 DIAGNOSIS — E669 Obesity, unspecified: Secondary | ICD-10-CM | POA: Diagnosis not present

## 2020-04-10 DIAGNOSIS — E7849 Other hyperlipidemia: Secondary | ICD-10-CM

## 2020-04-10 DIAGNOSIS — Z9189 Other specified personal risk factors, not elsewhere classified: Secondary | ICD-10-CM | POA: Diagnosis not present

## 2020-04-10 DIAGNOSIS — Z6831 Body mass index (BMI) 31.0-31.9, adult: Secondary | ICD-10-CM

## 2020-04-10 MED ORDER — VITAMIN D (ERGOCALCIFEROL) 1.25 MG (50000 UNIT) PO CAPS: 50000.0000 [IU] | ORAL_CAPSULE | ORAL | 0 refills | Status: DC

## 2020-04-16 NOTE — Progress Notes (Signed)
Chief Complaint:   OBESITY Jenna [name] is here to discuss her progress with her obesity treatment plan along with follow-up of her obesity related diagnoses. Jenna [name] is on the Category 3 Plan and states she is following her eating plan approximately 99% of the time. Jenna [name] states she is treadmill 60 minutes 3 times per week.  Today's visit was #: 20 Starting weight: 174 lbs Starting date: 04/18/2019 Today's weight: 164 lbs Today's date: 04/10/2020 Total lbs lost to date: 10 Total lbs lost since last in-office visit: 2  Interim History: Jenna [name] is really enjoying her walk on the treadmill and getting all food in. She denies hunger. She is no longer getting all food in. She is doing Ensure max 150 calories with 30 g protein for snack. She has no plans for the next few weeks. She wants to stay on category 3 a bit longer.  Subjective:   1. Vitamin D deficiency Jenna [name]'s last Vit D level was 38.2. She denies nausea, vomiting, and muscle weakness but notes fatigue.   2. Other hyperlipidemia Jenna [name]'s LDL 188, HDL 80, and triglycerides 144. She is not on statin therapy. She denies myalgias and transaminitis.  3. At risk for osteoporosis Jenna [name] is at higher risk of osteopenia and osteoporosis due to Vitamin D deficiency.   Assessment/Plan:   1. Vitamin D deficiency Low Vitamin D level contributes to fatigue and are associated with obesity, breast, and colon cancer. She agrees to continue to take prescription Vitamin D @50 ,000 IU every week and will follow-up for routine testing of Vitamin D, at least 2-3 times per year to avoid over-replacement.  - Vitamin D, Ergocalciferol, (DRISDOL) 1.25 MG (50000 UNIT) CAPS capsule; Take 1 capsule (50,000 Units total) by mouth every 7 (seven) days.  Dispense: 12 capsule; Refill: 0  2. Other hyperlipidemia Cardiovascular risk and specific lipid/LDL goals reviewed.  We discussed several lifestyle modifications today and Jenna [name] will continue to  work on diet, exercise and weight loss efforts. Orders and follow up as documented in patient record. Repeat labs in 3 months. If LDL is still elevated, consider statin therapy.  Counseling Intensive lifestyle modifications are the first line treatment for this issue. . Dietary changes: Increase soluble fiber. Decrease simple carbohydrates. . Exercise changes: Moderate to vigorous-intensity aerobic activity 150 minutes per week if tolerated. . Lipid-lowering medications: see documented in medical record.  3. At risk for osteoporosis Jenna [name] was given approximately 15 minutes of osteoporosis prevention counseling today. Jenna [name] is at risk for osteopenia and osteoporosis due to her Vitamin D deficiency. She was encouraged to take her Vitamin D and follow her higher calcium diet and increase strengthening exercise to help strengthen her bones and decrease her risk of osteopenia and osteoporosis.  Repetitive spaced learning was employed today to elicit superior memory formation and behavioral change.  4. Class 1 obesity with serious comorbidity and body mass index (BMI) of 31.0 to 31.9 in adult, unspecified obesity type Jenna [name] is currently in the action stage of change. As such, her goal is to continue with weight loss efforts. She has agreed to the Category 3 Plan.   Exercise goals: As is  Behavioral modification strategies: increasing lean protein intake, meal planning and cooking strategies, keeping healthy foods in the home and planning for success.  Jenna [name] has agreed to follow-up with our clinic in 3-4 weeks. She was informed of the importance of frequent follow-up visits to maximize her success with intensive lifestyle modifications for her multiple health conditions.   Objective:  Blood pressure 129/81, pulse 83, temperature 98.4 F (36.9 C), height 5\' 2"  (1.575 m), weight 164 lb (74.4 kg), SpO2 97 %. Body mass index is 30 kg/m.  General: Cooperative, alert, well developed, in  no acute distress. HEENT: Conjunctivae and lids unremarkable. Cardiovascular: Regular rhythm.  Lungs: Normal work of breathing. Neurologic: No focal deficits.   Lab Results  Component Value Date   CREATININE 0.64 10/30/2019   BUN 15 10/30/2019   NA 145 (H) 10/30/2019   K 5.1 10/30/2019   CL 104 10/30/2019   CO2 27 10/30/2019   Lab Results  Component Value Date   ALT 25 10/30/2019   AST 23 10/30/2019   ALKPHOS 84 10/30/2019   BILITOT 0.4 10/30/2019   Lab Results  Component Value Date   HGBA1C 5.2 03/24/2020   HGBA1C 5.1 10/30/2019   HGBA1C 5.2 04/18/2019   Lab Results  Component Value Date   INSULIN 9.5 03/24/2020   INSULIN 11.3 10/30/2019   INSULIN 11.3 04/18/2019   Lab Results  Component Value Date   TSH 3.22 11/12/2019   Lab Results  Component Value Date   CHOL 294 (H) 03/24/2020   HDL 80 03/24/2020   LDLCALC 188 (H) 03/24/2020   TRIG 144 03/24/2020   CHOLHDL 3.2 06/10/2017   Lab Results  Component Value Date   WBC 6.2 04/18/2019   HGB 14.5 04/18/2019   HCT 44.5 04/18/2019   MCV 91 04/18/2019   PLT 284 04/18/2019    Attestation Statements:   Reviewed by clinician on day of visit: allergies, medications, problem list, medical history, surgical history, family history, social history, and previous encounter notes.  Coral Ceo, am acting as transcriptionist for Coralie Common, MD.  I have reviewed the above documentation for accuracy and completeness, and I agree with the above. - Jinny Blossom, MD

## 2020-05-01 ENCOUNTER — Other Ambulatory Visit: Payer: Self-pay | Admitting: Hematology and Oncology

## 2020-05-05 ENCOUNTER — Encounter (INDEPENDENT_AMBULATORY_CARE_PROVIDER_SITE_OTHER): Payer: Self-pay | Admitting: Family Medicine

## 2020-05-05 ENCOUNTER — Other Ambulatory Visit: Payer: Self-pay

## 2020-05-05 ENCOUNTER — Ambulatory Visit (INDEPENDENT_AMBULATORY_CARE_PROVIDER_SITE_OTHER): Payer: BC Managed Care – PPO | Admitting: Family Medicine

## 2020-05-05 VITALS — BP 129/65 | HR 64 | Temp 98.1°F | Ht 62.0 in | Wt 163.0 lb

## 2020-05-05 DIAGNOSIS — E559 Vitamin D deficiency, unspecified: Secondary | ICD-10-CM | POA: Diagnosis not present

## 2020-05-05 DIAGNOSIS — Z6830 Body mass index (BMI) 30.0-30.9, adult: Secondary | ICD-10-CM | POA: Diagnosis not present

## 2020-05-05 DIAGNOSIS — E7849 Other hyperlipidemia: Secondary | ICD-10-CM

## 2020-05-05 DIAGNOSIS — E6609 Other obesity due to excess calories: Secondary | ICD-10-CM | POA: Diagnosis not present

## 2020-05-06 NOTE — Progress Notes (Signed)
Chief Complaint:   OBESITY Jenna [name] is here to discuss her progress with her obesity treatment plan along with follow-up of her obesity related diagnoses. Jenna [name] is on the Category 3 Plan and states she is following her eating plan approximately 97% of the time. Jenna [name] states she is walking on treadmill, 3-6 mile hike, and yard work 100 minutes 5 times per week.  Today's visit was #: 14 Starting weight: 174 lbs Starting date: 04/18/2019 Today's weight: 163 lbs Today's date: 05/05/2020 Total lbs lost to date: 11 Total lbs lost since last in-office visit: 1  Interim History: Pt has not returned to clinic since 04/10/2020. She has increased exercise regimen almost 3 times. She has been eating more fruit for snack calories and doing ensure drinks. The next 2 weeks, she has no plans except possibly going out to dinner for Mother's Day. Makes ~10 oz at dinner and normally doesn't have anything left.  Subjective:   1. Vitamin D deficiency Pt denies nausea, vomiting, and muscle weakness but notes fatigue. Pt is on OTC Vit D 2,000 IU daily.  2. Other hyperlipidemia LDL 188, triglycerides 144, and HDL 80. Jenna [name] is not on statin therapy.  Assessment/Plan:   1. Vitamin D deficiency Low Vitamin D level contributes to fatigue and are associated with obesity, breast, and colon cancer. She agrees to continue to take OTC Vitamin D @2 ,000 IU daily and will follow-up for routine testing of Vitamin D, at least 2-3 times per year to avoid over-replacement.  2. Other hyperlipidemia Cardiovascular risk and specific lipid/LDL goals reviewed.  We discussed several lifestyle modifications today and Jenna [name] will continue to work on diet, exercise and weight loss efforts. Orders and follow up as documented in patient record. Continue category 3 and repeat labs in August.  Counseling Intensive lifestyle modifications are the first line treatment for this issue. . Dietary changes: Increase soluble  fiber. Decrease simple carbohydrates. . Exercise changes: Moderate to vigorous-intensity aerobic activity 150 minutes per week if tolerated. . Lipid-lowering medications: see documented in medical record.  3. Obesity with current BMI 29.9 Jenna [name] is currently in the action stage of change. As such, her goal is to continue with weight loss efforts. She has agreed to the Category 3 Plan.   Exercise goals: As is  Behavioral modification strategies: increasing lean protein intake, meal planning and cooking strategies, keeping healthy foods in the home and planning for success.  Jenna [name] has agreed to follow-up with our clinic in 3-4 weeks. She was informed of the importance of frequent follow-up visits to maximize her success with intensive lifestyle modifications for her multiple health conditions.   Objective:   Blood pressure 129/65, pulse 64, temperature 98.1 F (36.7 C), height 5\' 2"  (1.575 m), weight 163 lb (73.9 kg), SpO2 98 %. Body mass index is 29.81 kg/m.  General: Cooperative, alert, well developed, in no acute distress. HEENT: Conjunctivae and lids unremarkable. Cardiovascular: Regular rhythm.  Lungs: Normal work of breathing. Neurologic: No focal deficits.   Lab Results  Component Value Date   CREATININE 0.64 10/30/2019   BUN 15 10/30/2019   NA 145 (H) 10/30/2019   K 5.1 10/30/2019   CL 104 10/30/2019   CO2 27 10/30/2019   Lab Results  Component Value Date   ALT 25 10/30/2019   AST 23 10/30/2019   ALKPHOS 84 10/30/2019   BILITOT 0.4 10/30/2019   Lab Results  Component Value Date   HGBA1C 5.2 03/24/2020   HGBA1C 5.1 10/30/2019  HGBA1C 5.2 04/18/2019   Lab Results  Component Value Date   INSULIN 9.5 03/24/2020   INSULIN 11.3 10/30/2019   INSULIN 11.3 04/18/2019   Lab Results  Component Value Date   TSH 3.22 11/12/2019   Lab Results  Component Value Date   CHOL 294 (H) 03/24/2020   HDL 80 03/24/2020   LDLCALC 188 (H) 03/24/2020   TRIG 144  03/24/2020   CHOLHDL 3.2 06/10/2017   Lab Results  Component Value Date   WBC 6.2 04/18/2019   HGB 14.5 04/18/2019   HCT 44.5 04/18/2019   MCV 91 04/18/2019   PLT 284 04/18/2019    Attestation Statements:   Reviewed by clinician on day of visit: allergies, medications, problem list, medical history, surgical history, family history, social history, and previous encounter notes.  Time spent on visit including pre-visit chart review and post-visit care and charting was 15 minutes.   Coral Ceo, am acting as transcriptionist for Coralie Common, MD.  I have reviewed the above documentation for accuracy and completeness, and I agree with the above. - Jinny Blossom, MD

## 2020-05-08 NOTE — Assessment & Plan Note (Signed)
Left lumpectomy 09/25/2015: IDC with DCIS, 1.2 cm, margins negative, 0/4 lymph nodes negative, grade 3, ER 70%, PR 5%, HER-2 positive ratio 3.45, Ki-67 10%, T1 CN 0 stage IA  Treatment plan: 1. Adjuvant chemotherapy with TCH 6 cycles 10/31/2015- 2/9/2018followed by Herceptin every 3 weeks for 1 yearcompleted 07/30/2016 2. Followed by adjuvant radiation 03/10/2016- 04/23/2016 3. Followed by adjuvant antiestrogen therapystarted 05/28/2016 ---------------------------------------------------------------------------------------------------------------------------------- Anastrozole toxicities: Night sweats have disappeared. Intermittent tendinitis for which she received injections and feeling much better.  Survivorship:She is walking for half miles every day along with lots of other physical activities. She got a new job at [employer]. She has lost 17 pounds in the last year.  CT CAP 02/07/2017: No evidence of metastatic disease, 4 mm subpleural left lower lobe nodule nonspecific Bone scan 02/07/2017: No findings of metastatic disease  Breast cancer surveillance: 10/15/19: Mammogram: Benign.  05/09/2020: Breast exam: Benign  Return to clinic in1 yearfor follow-up

## 2020-05-08 NOTE — Progress Notes (Signed)
 Patient Care Team: Breeback, Jade L, PA-C as PCP - General (Family Medicine) Cornett, Thomas, MD as Consulting Physician (General Surgery) Gudena, Vinay, MD as Consulting Physician (Hematology and Oncology) Kinard, James, MD as Consulting Physician (Radiation Oncology) Causey, Lindsey Cornetto, NP as Nurse Practitioner (Hematology and Oncology)  DIAGNOSIS:    ICD-10-CM   1. Malignant neoplasm of lower-outer quadrant of left breast of female, estrogen receptor positive (HCC)  C50.512    Z17.0     SUMMARY OF ONCOLOGIC HISTORY: Oncology History  Breast cancer of lower-outer quadrant of left female breast (HCC)  09/10/2015 Initial Diagnosis   Screening detected left breast asymmetry: 2 adjacent masses 4:00: 8 mm and 3:30: 9 mm; axilla negative, biopsy grade 2-3 IDC, ER 70%, PR 5%, Ki-67 10%, HER-2 positive ratio 3.41, T1BN0 stage IA   09/25/2015 Surgery   Left lumpectomy: IDC with DCIS, 1.2 cm, margins negative, 0/4 lymph nodes negative, grade 3, ER 70%, PR 5%, HER-2 positive ratio 3.45, Ki-67 10%, T1 CN 0 stage IA   10/31/2015 - 02/13/2016 Chemotherapy   Adjuvant chemotherapy with TCH 6 cycles followed by Herceptin maintenance for 1 year   03/10/2016 - 04/23/2016 Radiation Therapy   Adjuvant radiation therapy   05/28/2016 -  Anti-estrogen oral therapy   Anastrozole 1 mg daily, changed to letrozole after 2 months due to poor tolerance, switched to anastrozole due to right forearm pain from tendinitis     CHIEF COMPLIANT: Follow-up of left breast cancer on anastrozole therapy  INTERVAL HISTORY: Jenna [name] is a 60 y.o. with above-mentioned history of  left breast cancer treated with lumpectomy, adjuvant chemotherapy, radiation, and anti-estrogen therapy with anastrozole. Mammogram on 10/15/19 showed no evidence of malignancy bilaterally.  She presents to the clinic today for annual follow-up.   ALLERGIES:  is allergic to latex and sulfa antibiotics.  MEDICATIONS:  Current  Outpatient Medications  Medication Sig Dispense Refill  . acetaminophen (TYLENOL) 500 MG tablet Take 500 mg by mouth every 6 (six) hours as needed.    . anastrozole (ARIMIDEX) 1 MG tablet TAKE 1 TABLET BY MOUTH EVERY DAY 90 tablet 3  . Calcium Carbonate-Vit D-Min (CALCIUM 1200 PO) Take by mouth.    . Cholecalciferol (VITAMIN D3) 1000 units CAPS Take 1,000 Int'l Units by mouth. 2 capsules per day    . DULoxetine (CYMBALTA) 60 MG capsule Take 1 capsule (60 mg total) by mouth daily. 90 capsule 3  . levothyroxine (SYNTHROID) 100 MCG tablet Take 1 tablet (100 mcg total) by mouth daily. 45 tablet 3  . liothyronine (CYTOMEL) 5 MCG tablet Take 0.5 tablets (2.5 mcg total) by mouth daily. 45 tablet 3  . Magnesium 500 MG CAPS Take 500 mg by mouth.    . Multiple Vitamin (MULTIVITAMIN) tablet Take 1 tablet by mouth daily.    . Simethicone (GAS-X PO) Take by mouth.    . Vitamin D, Ergocalciferol, (DRISDOL) 1.25 MG (50000 UNIT) CAPS capsule Take 1 capsule (50,000 Units total) by mouth every 14 (fourteen) days. 6 capsule 0   No current facility-administered medications for this visit.    PHYSICAL EXAMINATION: ECOG PERFORMANCE STATUS: 1 - Symptomatic but completely ambulatory  Vitals:   05/09/20 1057  BP: 139/79  Pulse: 88  Resp: 15  Temp: 97.7 F (36.5 C)  SpO2: 100%   Filed Weights   05/09/20 1057  Weight: 167 lb 9.6 oz (76 kg)    BREAST: No palpable masses or nodules in either right or left breasts. No palpable axillary supraclavicular   or infraclavicular adenopathy no breast tenderness or nipple discharge. (exam performed in the presence of a chaperone)  LABORATORY DATA:  I have reviewed the data as listed CMP Latest Ref Rng & Units 10/30/2019 04/18/2019 06/10/2017  Glucose 65 - 99 mg/dL 87 87 92  BUN 6 - 24 mg/dL _0 Creatinine 0.57 - 1.00 mg/dL 0.64 0.63 0.77  Sodium 134 - 144 mmol/L 145(H) 143 143  Potassium 3.5 - 5.2 mmol/L 5.1 4.4 5.2  Chloride 96 - 106 mmol/L 104 103 104  CO2  20 - 29 mmol/L _1 Calcium 8.7 - 10.2 mg/dL 9.9 10.2 9.9  Total Protein 6.0 - 8.5 g/dL 7.2 7.1 7.3  Total Bilirubin 0.0 - 1.2 mg/dL 0.4 0.3 0.5  Alkaline Phos 44 - 121 IU/L 84 86 -  AST 0 - 40 IU/L _2 ALT 0 - 32 IU/L _3 Lab Results  Component Value Date   WBC 6.2 04/18/2019   HGB 14.5 04/18/2019   HCT 44.5 04/18/2019   MCV 91 04/18/2019   PLT 284 04/18/2019   NEUTROABS 4.0 04/18/2019    ASSESSMENT & PLAN:  Malignant neoplasm of lower-outer quadrant of left breast of female, estrogen receptor positive ([facility]) Left lumpectomy 09/25/2015: IDC with DCIS, 1.2 cm, margins negative, 0/4 lymph nodes negative, grade 3, ER 70%, PR 5%, HER-2 positive ratio 3.45, Ki-67 10%, T1 CN 0 stage IA  Treatment plan: 1. Adjuvant chemotherapy with TCH 6 cycles 10/31/2015- 2/9/2018followed by Herceptin every 3 weeks for 1 yearcompleted 07/30/2016 2. Followed by adjuvant radiation 03/10/2016- 04/23/2016 3. Followed by adjuvant antiestrogen therapystarted 05/28/2016 ---------------------------------------------------------------------------------------------------------------------------------- Anastrozole toxicities: Does not have any myalgias Plan to treat her for 7 years.  Survivorship:She is walking for half miles every day along with lots of other physical activities.  She was able to keep the weight off.  CT CAP 02/07/2017: No evidence of metastatic disease, 4 mm subpleural left lower lobe nodule nonspecific Bone scan 02/07/2017: No findings of metastatic disease  Breast cancer surveillance: 10/15/19: Mammogram: Benign.  05/09/2020: Breast exam: Benign  Return to clinic in1 yearfor follow-up    No orders of the defined types were placed in this encounter.  The patient has a good understanding of the overall plan. she agrees with it. she will call with any problems that may develop before the next visit here.  Total time spent: 20 mins including face to face time  and time spent for planning, charting and coordination of care  Rulon Eisenmenger, MD, MPH 05/09/2020  I, Cloyde Reams Dorshimer, am acting as scribe for Dr. Nicholas Lose.  I have reviewed the above documentation for accuracy and completeness, and I agree with the above.

## 2020-05-09 ENCOUNTER — Other Ambulatory Visit: Payer: Self-pay

## 2020-05-09 ENCOUNTER — Inpatient Hospital Stay: Payer: BC Managed Care – PPO | Attending: Hematology and Oncology | Admitting: Hematology and Oncology

## 2020-05-09 DIAGNOSIS — Z79811 Long term (current) use of aromatase inhibitors: Secondary | ICD-10-CM | POA: Diagnosis not present

## 2020-05-09 DIAGNOSIS — Z923 Personal history of irradiation: Secondary | ICD-10-CM | POA: Diagnosis not present

## 2020-05-09 DIAGNOSIS — Z9221 Personal history of antineoplastic chemotherapy: Secondary | ICD-10-CM | POA: Insufficient documentation

## 2020-05-09 DIAGNOSIS — C50512 Malignant neoplasm of lower-outer quadrant of left female breast: Secondary | ICD-10-CM | POA: Insufficient documentation

## 2020-05-09 DIAGNOSIS — Z170 Estrogen receptor positive status [ER+]: Secondary | ICD-10-CM

## 2020-05-13 ENCOUNTER — Encounter: Payer: Self-pay | Admitting: Internal Medicine

## 2020-05-13 ENCOUNTER — Other Ambulatory Visit: Payer: Self-pay

## 2020-05-13 ENCOUNTER — Ambulatory Visit: Payer: BC Managed Care – PPO | Admitting: Internal Medicine

## 2020-05-13 ENCOUNTER — Other Ambulatory Visit: Payer: Self-pay | Admitting: Internal Medicine

## 2020-05-13 VITALS — BP 130/92 | HR 72 | Ht 62.0 in | Wt 167.0 lb

## 2020-05-13 DIAGNOSIS — E038 Other specified hypothyroidism: Secondary | ICD-10-CM | POA: Diagnosis not present

## 2020-05-13 DIAGNOSIS — E063 Autoimmune thyroiditis: Secondary | ICD-10-CM | POA: Diagnosis not present

## 2020-05-13 LAB — T3, FREE: T3, Free: 3.2 pg/mL (ref 2.3–4.2)

## 2020-05-13 LAB — T4, FREE: Free T4: 1.09 ng/dL (ref 0.60–1.60)

## 2020-05-13 LAB — TSH: TSH: 0.79 u[IU]/mL (ref 0.35–4.50)

## 2020-05-13 MED ORDER — LEVOTHYROXINE SODIUM 112 MCG PO TABS: 112.0000 ug | ORAL_TABLET | Freq: Every day | ORAL | 3 refills | Status: DC

## 2020-05-13 NOTE — Patient Instructions (Signed)
Please continue: - Levothyroxine 100 mcg daily - liothyronine 2.5 mcg daily.  Take the thyroid hormone every day, with water, at least 30 minutes before breakfast, separated by at least 4 hours from: - acid reflux medications - calcium - iron - multivitamins  Please stop at the lab.  Please come back for a follow-up appointment in 6 months.

## 2020-05-13 NOTE — Progress Notes (Signed)
Patient ID: Jenna [name], female   DOB: 1960-07-27, 60 y.o.   MRN: [id number]   This visit occurred during the SARS-CoV-2 public health emergency.  Safety protocols were in place, including screening questions prior to the visit, additional usage of staff PPE, and extensive cleaning of exam room while observing appropriate contact time as indicated for disinfecting solutions.   HPI  Jenna [name] is a 60 y.o.-year-old female, referred by Dr. Adair Patter, for management of Hashimoto's hypothyroidism.  Last visit 6 months ago.  Interim history: She is active, has plenty of energy >> walking on treadmill 1:40 min daily (does different routes) and hikes during the weekend 4-6 mi with her husband. At this visit, she has no complaints other than anxiety with occasional anxiety attacks, which is not new for her.  On Cymbalta.  Reviewed and addended history: She was diagnosed with Hashimoto's hypothyroidism in 2015-2014 after she described lethargy, inability to lose weight, hair loss.  She was started on Levothyroxine (LT4) initially, but quickly changed to liothyronine (LT3) 50 mcg daily >> then changed to 25 mcg daily in 04/2019 (equivalent of 100 mcg levothyroxine daily).  On this regimen, her TSH was suppressed.  In 05/2019, we changed to: - LT4 50 mcg daily - LT3 12.5 mcg daily For an equivalent of LT4 of 100 mcg daily  In 06/2019, we changed to: - LT4 88 mcg daily - LT3 5 mcg daily For an equivalent of LT4 of 108 mcg daily  In 11/2019, we changed to:  - LT4 100 mcg daily  - LT3 2.5 mcg daily  For an equivalent of 110 mcg daily LT4  She feels much better after we started to decrease her LT3 dose!  She takes the thyroid hormones: - previously at night (2-3 hours after dinner) >> switched to a.m. - black coffee - + Calcium - at night - + MVI - at night - prev.  On omeprazole twice a day, now off - prev.  On biotin, now off  I reviewed her TFTs: Lab Results  Component Value  Date   TSH 3.22 11/12/2019   TSH 2.36 06/21/2019   TSH 2.15 05/14/2019   TSH 0.018 (L) 04/18/2019   TSH 6.24 (H) 12/08/2018   TSH 0.43 06/10/2017   TSH 7.16 (H) 07/16/2016   TSH 0.10 (L) 08/12/2015   TSH 1.885 10/28/2014   TSH 1.39 05/02/2014   FREET4 0.77 11/12/2019   FREET4 0.77 06/21/2019   FREET4 0.21 (L) 05/14/2019   FREET4 0.20 (L) 04/18/2019   FREET4 1.2 08/12/2015    Her thyroid antibodies were elevated: Component     Latest Ref Rng & Units 11/12/2019  Thyroperoxidase Ab SerPl-aCnc     <9 IU/mL 335 (H)  Thyroglobulin Ab     < or = 1 IU/mL 16 (H)   Component     Latest Ref Rng & Units 05/14/2019  Thyroperoxidase Ab SerPl-aCnc     <9 IU/mL 379 (H)  Thyroglobulin Ab     < or = 1 IU/mL 13 (H)  Previously: 08/12/2015: ATA 14 (<2), TPO 162 ( (<9)  In 05/2019, I advised her to start selenium 200 mcg daily.  At last visit, she described: - no weight gain, but difficult to lose weight - + fatigue - + heat intolerance - no constipation - + dry skin - + hair loss - + depression, + anxiety  At last visit, she still complained of hair loss and some depression and anxiety.  At this visit,  she continues to have anxiety and also some hair loss.  Pt denies: - feeling nodules in neck - hoarseness - dysphagia - choking - SOB with lying down  No FH of thyroid cancer. + FH of autoimmune ds.: sister and brother. Father with SLE. P cousins with Hashimoto's thyroiditis.  No h/o radiation tx to head or neck. No herbal supplements. No Biotin use. No recent steroids use.   Pt. also has a history of breast cancer.  She is on anastrozole -she completed 5 years and will complete 2 more.  She walks and swims for exercise.  ROS: Constitutional: no weight gain/no weight loss, resolved fatigue, resolved subjective hyperthermia, no subjective hypothermia Eyes: no blurry vision, no xerophthalmia ENT: no sore throat, + see HPI Cardiovascular: no CP/no SOB/no palpitations/no leg  swelling Respiratory: no cough/no SOB/no wheezing Gastrointestinal: no N/no V/no D/no C/+ acid reflux Musculoskeletal: no muscle aches/no joint aches Skin: no rashes, + hair loss Neurological: no tremors/no numbness/no tingling/no dizziness  I reviewed pt's medications, allergies, PMH, social hx, family hx, and changes were documented in the history of present illness. Otherwise, unchanged from my initial visit note.  Past Medical History:  Diagnosis Date  . Anxiety   . Asthma   . Breast cancer ([place]) 2017   Left Breast Cancer  . Breast cancer of lower-outer quadrant of left female breast ([place]) 09/12/2015  . Complication of anesthesia    hard time waking up with gallbladder  . Depression   . Gallbladder problem   . GERD (gastroesophageal reflux disease)   . Hashimoto's disease   . Heartburn   . History of external beam radiation therapy 03/10/16-04/23/16   left breast/50.4 Gy in 28 fractions, left breast boost/10 Gy in 5 fractions  . Hypothyroidism   . Irritable bowel   . Joint pain   . Personal history of chemotherapy 2017-2018   Left Breast Cancer  . Personal history of radiation therapy 2018   Left Breast Cancer  . Uterine displacement    Past Surgical History:  Procedure Laterality Date  . BACK SURGERY    . BREAST BIOPSY Left 09/10/2015  . BREAST LUMPECTOMY Left 09/25/2015  . BREAST LUMPECTOMY WITH NEEDLE LOCALIZATION AND AXILLARY SENTINEL LYMPH NODE BX Left 09/25/2015   Procedure: BREAST LUMPECTOMY WITH NEEDLE LOCALIZATION X'S 2 AND AXILLARY SENTINEL LYMPH NODE BX;  Surgeon: Erroll Luna, MD;  Location: [facility];  Service: General;  Laterality: Left;  . CHOLECYSTECTOMY OPEN    . COLONOSCOPY WITH PROPOFOL N/A 04/13/2019   Procedure: COLONOSCOPY WITH PROPOFOL;  Surgeon: Jonathon Bellows, MD;  Location: [facility] ENDOSCOPY;  Service: Gastroenterology;  Laterality: N/A;  . disk fusion    . ENDOMETRIAL ABLATION    . MASTOPEXY Bilateral 10/25/2016   Procedure:  BILATERAL MASTOPEXY;  Surgeon: Irene Limbo, MD;  Location: [place];  Service: Plastics;  Laterality: Bilateral;  . neck fusion    . PORT-A-CATH REMOVAL Right 10/25/2016   Procedure: RIGHT CHEST PORT REMOVAL;  Surgeon: Irene Limbo, MD;  Location: [place];  Service: Plastics;  Laterality: Right;  . PORTACATH PLACEMENT Right 09/25/2015   Procedure: INSERTION PORT-A-CATH;  Surgeon: Erroll Luna, MD;  Location: [place];  Service: General;  Laterality: Right;  . REDUCTION MAMMAPLASTY Bilateral 2018   Social History   Socioeconomic History  . Marital status: Married    Spouse name: Not on file  . Number of children: 2  . Years of education: Not on file  . Highest  education level: Not on file  Occupational History  . Occupation: n/a  Tobacco Use  . Smoking status: Former Smoker    Years: 15.00    Quit date: 1986    Years since quitting: 36.3  . Smokeless tobacco: Never Used  Vaping Use  . Vaping Use: Never used  Substance and Sexual Activity  . Alcohol use: Yes    Alcohol/week: 2.0 - 3.0 standard drinks    Types: 2 - 3 Standard drinks or equivalent per week    Comment: social  . Drug use: No  . Sexual activity: Yes    Partners: Male  Other Topics Concern  . Not on file  Social History Narrative  . Not on file   Social Determinants of Health   Financial Resource Strain: Not on file  Food Insecurity: Not on file  Transportation Needs: Not on file  Physical Activity: Not on file  Stress: Not on file  Social Connections: Not on file  Intimate Partner Violence: Not on file   Current Outpatient Medications on File Prior to Visit  Medication Sig Dispense Refill  . acetaminophen (TYLENOL) 500 MG tablet Take 500 mg by mouth every 6 (six) hours as needed.    Marland Kitchen anastrozole (ARIMIDEX) 1 MG tablet TAKE 1 TABLET BY MOUTH EVERY DAY 90 tablet 3  . Calcium Carbonate-Vit D-Min (CALCIUM 1200 PO) Take by mouth.    .  Cholecalciferol (VITAMIN D3) 1000 units CAPS Take 1,000 Int'l Units by mouth. 2 capsules per day    . DULoxetine (CYMBALTA) 60 MG capsule Take 1 capsule (60 mg total) by mouth daily. 90 capsule 3  . levothyroxine (SYNTHROID) 100 MCG tablet Take 1 tablet (100 mcg total) by mouth daily. 45 tablet 3  . liothyronine (CYTOMEL) 5 MCG tablet Take 0.5 tablets (2.5 mcg total) by mouth daily. 45 tablet 3  . Magnesium 500 MG CAPS Take 500 mg by mouth.    . Multiple Vitamin (MULTIVITAMIN) tablet Take 1 tablet by mouth daily.    . Simethicone (GAS-X PO) Take by mouth.    . Vitamin D, Ergocalciferol, (DRISDOL) 1.25 MG (50000 UNIT) CAPS capsule Take 1 capsule (50,000 Units total) by mouth every 14 (fourteen) days. 6 capsule 0   No current facility-administered medications on file prior to visit.   Allergies  Allergen Reactions  . Latex Rash  . Sulfa Antibiotics Rash   Family History  Problem Relation Age of Onset  . Diabetes Mother   . Hypertension Mother   . Hyperlipidemia Mother   . Kidney disease Mother   . Obesity Mother   . Cancer Maternal Grandfather   . Obesity Father   . Thyroid disease Other   Also, please see HPI. HTN in oldest brother.  PE: BP (!) 130/92 (BP Location: Right Arm, Patient Position: Sitting, Cuff Size: Normal)   Pulse 72   Ht 5\' 2"  (1.575 m)   Wt 167 lb (75.8 kg)   SpO2 98%   BMI 30.54 kg/m  Wt Readings from Last 3 Encounters:  05/13/20 167 lb (75.8 kg)  05/09/20 167 lb 9.6 oz (76 kg)  05/05/20 163 lb (73.9 kg)   Constitutional: overweight, in NAD Eyes: PERRLA, EOMI, no exophthalmos ENT: moist mucous membranes, no thyromegaly, no cervical lymphadenopathy Cardiovascular: RRR, No MRG Respiratory: CTA B Gastrointestinal: abdomen soft, NT, ND, BS+ Musculoskeletal: no deformities, strength intact in all 4 Skin: moist, warm, no rashes Neurological: no tremor with outstretched hands, DTR normal in all 4  ASSESSMENT: 1.  Hypothyroidism  due to Hashimoto's  thyroiditis  PLAN: 1.  - Patient with history of Hashimoto's thyroiditis, with subsequent hypothyroidism, on replacement with only liothyronine (LT3) at our first visit.  She has previously been on this for a long time with the dose reduced from 50 mcg daily (equivalent LT4 dose of 200 mcg daily) to 25 mcg daily (equivalent LT4 dose of 100 mcg daily).  Her TSH was suppressed on this dose.  We discussed about side effects of using only liothyronine including sleeping problems, hot flashes, stimulatory effects on the heart and also nervous system, and also osteoporosis long-term especially if the TSH is suppressed.  We started to reduce the LT3 dose slowly while increasing the dose of LT4.  Last dose change was in 11/2019.  However, she did not return for labs afterwards -She continues on selenium 200 mcg daily in an effort to decrease the TPO and ATA antibodies.  These were still elevated at last visit >> now off Selenium - latest thyroid labs reviewed with pt >> normal: Lab Results  Component Value Date   TSH 3.22 11/12/2019   - she continues on LT4 100 + LT3 2.5 mcg daily  - pt feels good on this dose but is interested in stopping LT3 if possible - we discussed about taking the thyroid hormone every day, with water, >30 minutes before breakfast, separated by >4 hours from acid reflux medications, calcium, iron, multivitamins. Pt. is taking it correctly. - will check thyroid tests today: TSH, free T3 and fT4 - If labs are abnormal, she will need to return for repeat TFTs in 1.5 months - OTW, I will see her back in 6 months  Needs refills. Need to stop LT3 if possible. Component     Latest Ref Rng & Units 05/13/2020  T4,Free(Direct)     0.60 - 1.60 ng/dL 1.09  TSH     0.35 - 4.50 uIU/mL 0.79  Triiodothyronine,Free,Serum     2.3 - 4.2 pg/mL 3.2  Thyroid tests are normal.  We will try to stop liothyronine and increase levothyroxine to 112 mcg daily and recheck her test in 1.5  months.  Philemon Kingdom, MD PhD [facility] Endocrinology

## 2020-05-15 ENCOUNTER — Other Ambulatory Visit: Payer: Self-pay | Admitting: Physician Assistant

## 2020-05-15 DIAGNOSIS — Z1231 Encounter for screening mammogram for malignant neoplasm of breast: Secondary | ICD-10-CM

## 2020-05-26 ENCOUNTER — Ambulatory Visit (INDEPENDENT_AMBULATORY_CARE_PROVIDER_SITE_OTHER): Payer: BC Managed Care – PPO | Admitting: Family Medicine

## 2020-05-26 ENCOUNTER — Encounter (INDEPENDENT_AMBULATORY_CARE_PROVIDER_SITE_OTHER): Payer: Self-pay | Admitting: Family Medicine

## 2020-05-26 ENCOUNTER — Other Ambulatory Visit: Payer: Self-pay

## 2020-05-26 VITALS — BP 153/88 | HR 65 | Temp 98.3°F | Ht 62.0 in | Wt 164.0 lb

## 2020-05-26 DIAGNOSIS — E669 Obesity, unspecified: Secondary | ICD-10-CM | POA: Diagnosis not present

## 2020-05-26 DIAGNOSIS — R030 Elevated blood-pressure reading, without diagnosis of hypertension: Secondary | ICD-10-CM

## 2020-05-26 DIAGNOSIS — E7849 Other hyperlipidemia: Secondary | ICD-10-CM | POA: Diagnosis not present

## 2020-05-26 DIAGNOSIS — E038 Other specified hypothyroidism: Secondary | ICD-10-CM

## 2020-05-26 DIAGNOSIS — Z6831 Body mass index (BMI) 31.0-31.9, adult: Secondary | ICD-10-CM | POA: Diagnosis not present

## 2020-05-27 NOTE — Progress Notes (Signed)
Chief Complaint:   OBESITY Jenna [name] is here to discuss her progress with her obesity treatment plan along with follow-up of her obesity related diagnoses. Jenna [name] is on the Category 3 Plan and states she is following her eating plan approximately 97% of the time. Jenna [name] states she is incline training and doing 3-6 mile hike (Sundays) 100 minutes 6 times per week.  Today's visit was #: 15 Starting weight: 174 lbs Starting date: 04/18/2019 Today's weight: 164 lbs Today's date: 05/26/2020 Total lbs lost to date: 10 Total lbs lost since last in-office visit: 0  Interim History: Jenna [name] is making non-scale victories and is fitting in clothes she did not previously. She reports she is enjoying her resistance training and her weekly hikes. She is going to [employer] for [employer]. Pt did eat a few extra snacks, particularly at night; she is hungry at that time.  Subjective:   1. Other hyperlipidemia LDL 188, HDL 80, and triglycerides 144. Jenna [name] is not on statin therapy.  2. Elevated BP without diagnosis of hypertension BP elevated today. Pt denies chest pain/chest pressure/headache. Jenna [name] is not on medication.  BP Readings from Last 3 Encounters:  05/26/20 (!) 153/88  05/13/20 (!) 130/92  05/09/20 139/79    Assessment/Plan:   1. Other hyperlipidemia Cardiovascular risk and specific lipid/LDL goals reviewed.  We discussed several lifestyle modifications today and Jenna [name] will continue to work on diet, exercise and weight loss efforts. Orders and follow up as documented in patient record.  -Repeat labs in August.  Counseling Intensive lifestyle modifications are the first line treatment for this issue. . Dietary changes: Increase soluble fiber. Decrease simple carbohydrates. . Exercise changes: Moderate to vigorous-intensity aerobic activity 150 minutes per week if tolerated. . Lipid-lowering medications: see documented in medical record.  2. Elevated BP without  diagnosis of hypertension Jenna [name] is working on healthy weight loss and exercise to improve blood pressure control. We will watch for signs of hypotension as she continues her lifestyle modifications. -Repeat BP at next appt.  3. Class 1 obesity with serious comorbidity and body mass index (BMI) of 31.0 to 31.9 in adult, unspecified obesity type Jenna [name] is currently in the action stage of change. As such, her goal is to continue with weight loss efforts. She has agreed to the Category 4 Plan.   Exercise goals: As is  Behavioral modification strategies: increasing lean protein intake, meal planning and cooking strategies, keeping healthy foods in the home and planning for success.  Jenna [name] has agreed to follow-up with our clinic in 3-4 weeks. She was informed of the importance of frequent follow-up visits to maximize her success with intensive lifestyle modifications for her multiple health conditions.    Objective:   Blood pressure (!) 153/88, pulse 65, temperature 98.3 F (36.8 C), height 5\' 2"  (1.575 m), weight 164 lb (74.4 kg), SpO2 98 %. Body mass index is 30 kg/m.  General: Cooperative, alert, well developed, in no acute distress. HEENT: Conjunctivae and lids unremarkable. Cardiovascular: Regular rhythm.  Lungs: Normal work of breathing. Neurologic: No focal deficits.   Lab Results  Component Value Date   CREATININE 0.64 10/30/2019   BUN 15 10/30/2019   NA 145 (H) 10/30/2019   K 5.1 10/30/2019   CL 104 10/30/2019   CO2 27 10/30/2019   Lab Results  Component Value Date   ALT 25 10/30/2019   AST 23 10/30/2019   ALKPHOS 84 10/30/2019   BILITOT 0.4 10/30/2019   Lab Results  Component Value Date  HGBA1C 5.2 03/24/2020   HGBA1C 5.1 10/30/2019   HGBA1C 5.2 04/18/2019   Lab Results  Component Value Date   INSULIN 9.5 03/24/2020   INSULIN 11.3 10/30/2019   INSULIN 11.3 04/18/2019   Lab Results  Component Value Date   TSH 0.79 05/13/2020   Lab Results   Component Value Date   CHOL 294 (H) 03/24/2020   HDL 80 03/24/2020   LDLCALC 188 (H) 03/24/2020   TRIG 144 03/24/2020   CHOLHDL 3.2 06/10/2017   Lab Results  Component Value Date   WBC 6.2 04/18/2019   HGB 14.5 04/18/2019   HCT 44.5 04/18/2019   MCV 91 04/18/2019   PLT 284 04/18/2019   No results found for: IRON, TIBC, FERRITIN  Attestation Statements:   Reviewed by clinician on day of visit: allergies, medications, problem list, medical history, surgical history, family history, social history, and previous encounter notes.  Coral Ceo, CMA, am acting as transcriptionist for Coralie Common, MD.   I have reviewed the above documentation for accuracy and completeness, and I agree with the above. - Jinny Blossom, MD

## 2020-06-23 ENCOUNTER — Encounter (INDEPENDENT_AMBULATORY_CARE_PROVIDER_SITE_OTHER): Payer: Self-pay | Admitting: Family Medicine

## 2020-06-23 ENCOUNTER — Other Ambulatory Visit: Payer: Self-pay

## 2020-06-23 ENCOUNTER — Ambulatory Visit (INDEPENDENT_AMBULATORY_CARE_PROVIDER_SITE_OTHER): Payer: BC Managed Care – PPO | Admitting: Family Medicine

## 2020-06-23 VITALS — BP 164/94 | HR 74 | Temp 97.9°F | Ht 62.0 in | Wt 164.0 lb

## 2020-06-23 DIAGNOSIS — Z9189 Other specified personal risk factors, not elsewhere classified: Secondary | ICD-10-CM

## 2020-06-23 DIAGNOSIS — E559 Vitamin D deficiency, unspecified: Secondary | ICD-10-CM

## 2020-06-23 DIAGNOSIS — Z6831 Body mass index (BMI) 31.0-31.9, adult: Secondary | ICD-10-CM

## 2020-06-23 DIAGNOSIS — F32A Depression, unspecified: Secondary | ICD-10-CM

## 2020-06-23 DIAGNOSIS — E669 Obesity, unspecified: Secondary | ICD-10-CM

## 2020-06-23 DIAGNOSIS — F419 Anxiety disorder, unspecified: Secondary | ICD-10-CM | POA: Diagnosis not present

## 2020-06-23 MED ORDER — DULOXETINE HCL 60 MG PO CPEP: 60.0000 mg | ORAL_CAPSULE | Freq: Every day | ORAL | 0 refills | Status: DC

## 2020-06-23 MED ORDER — VITAMIN D (ERGOCALCIFEROL) 1.25 MG (50000 UNIT) PO CAPS: 50000.0000 [IU] | ORAL_CAPSULE | ORAL | 0 refills | Status: DC

## 2020-06-24 ENCOUNTER — Encounter (INDEPENDENT_AMBULATORY_CARE_PROVIDER_SITE_OTHER): Payer: Self-pay | Admitting: Family Medicine

## 2020-06-24 NOTE — Telephone Encounter (Signed)
Dr.Ukleja 

## 2020-06-24 NOTE — Progress Notes (Signed)
Chief Complaint:   OBESITY Jenna [name] is here to discuss her progress with her obesity treatment plan along with follow-up of her obesity related diagnoses. Jenna [name] is on the Category 4 Plan and states she is following her eating plan approximately 97% of the time. Jenna [name] states she is swimming and walking on treadmill 2-3 hours 2-5 times per week.  Today's visit was #: 71 Starting weight: 174 lbs Starting date: 04/18/2019 Today's weight: 164 lbs Today's date: 06/23/2020 Total lbs lost to date: 10 Total lbs lost since last in-office visit: 0  Interim History: Jenna [name] is feeling good- getting up to 2 hours on treadmill 5 times a week. She is noticing clothes fitting better. She denies hunger. A couple of days, she didn't get all food in and is often missing out on calories in the morning. She is getting all protein in. For snacks she is doing Ensure drinks, string cheese, and Sk[employer] Girl popcorn.  Subjective:   1. Anxiety and depression Jenna [name] denies suicidal or homicidal ideations. She is about to run out of Cymbalta.  2. Vitamin D deficiency Jenna [name] denies nausea, vomiting, and muscle weakness but notes fatigue. She is on prescription Vit D.  3. At risk for osteoporosis Jenna [name] is at higher risk of osteopenia and osteoporosis due to Vitamin D deficiency.   Assessment/Plan:   1. Anxiety and depression Behavior modification techniques were discussed today to help Jenna [name] deal with her anxiety.  Orders and follow up as documented in patient record. Behavior modification techniques were discussed today to help Jenna [name] deal with her emotional/non-hunger eating behaviors.  Orders and follow up as documented in patient record.   - DULoxetine (CYMBALTA) 60 MG capsule; Take 1 capsule (60 mg total) by mouth daily.  Dispense: 30 capsule; Refill: 0  2. Vitamin D deficiency Low Vitamin D level contributes to fatigue and are associated with obesity, breast, and colon cancer. She  agrees to continue to take prescription Vitamin D @50 ,000 IU every week and will follow-up for routine testing of Vitamin D, at least 2-3 times per year to avoid over-replacement.  - Vitamin D, Ergocalciferol, (DRISDOL) 1.25 MG (50000 UNIT) CAPS capsule; Take 1 capsule (50,000 Units total) by mouth every 14 (fourteen) days.  Dispense: 6 capsule; Refill: 0  3. At risk for osteoporosis Jenna [name] was given approximately 15 minutes of osteoporosis prevention counseling today. Jenna [name] is at risk for osteopenia and osteoporosis due to her Vitamin D deficiency. She was encouraged to take her Vitamin D and follow her higher calcium diet and increase strengthening exercise to help strengthen her bones and decrease her risk of osteopenia and osteoporosis.  Repetitive spaced learning was employed today to elicit superior memory formation and behavioral change.  4. Class 1 obesity with serious comorbidity and body mass index (BMI) of 31.0 to 31.9 in adult, unspecified obesity type  Jenna [name] is currently in the action stage of change. As such, her goal is to continue with weight loss efforts. She has agreed to the Category 3 Plan and the Category 4 Plan- one meal per day.   Exercise goals:  As is  Behavioral modification strategies: increasing lean protein intake, meal planning and cooking strategies, and keeping healthy foods in the home.  Jenna [name] has agreed to follow-up with our clinic in 3-4 weeks. She was informed of the importance of frequent follow-up visits to maximize her success with intensive lifestyle modifications for her multiple health conditions.   Objective:   Blood pressure (!) 164/94, pulse 74, temperature  97.9 F (36.6 C), height 5\' 2"  (1.575 m), weight 164 lb (74.4 kg), SpO2 100 %. Body mass index is 30 kg/m.  General: Cooperative, alert, well developed, in no acute distress. HEENT: Conjunctivae and lids unremarkable. Cardiovascular: Regular rhythm.  Lungs: Normal work of  breathing. Neurologic: No focal deficits.   Lab Results  Component Value Date   CREATININE 0.64 10/30/2019   BUN 15 10/30/2019   NA 145 (H) 10/30/2019   K 5.1 10/30/2019   CL 104 10/30/2019   CO2 27 10/30/2019   Lab Results  Component Value Date   ALT 25 10/30/2019   AST 23 10/30/2019   ALKPHOS 84 10/30/2019   BILITOT 0.4 10/30/2019   Lab Results  Component Value Date   HGBA1C 5.2 03/24/2020   HGBA1C 5.1 10/30/2019   HGBA1C 5.2 04/18/2019   Lab Results  Component Value Date   INSULIN 9.5 03/24/2020   INSULIN 11.3 10/30/2019   INSULIN 11.3 04/18/2019   Lab Results  Component Value Date   TSH 0.79 05/13/2020   Lab Results  Component Value Date   CHOL 294 (H) 03/24/2020   HDL 80 03/24/2020   LDLCALC 188 (H) 03/24/2020   TRIG 144 03/24/2020   CHOLHDL 3.2 06/10/2017   Lab Results  Component Value Date   WBC 6.2 04/18/2019   HGB 14.5 04/18/2019   HCT 44.5 04/18/2019   MCV 91 04/18/2019   PLT 284 04/18/2019   No results found for: IRON, TIBC, FERRITIN  Attestation Statements:   Reviewed by clinician on day of visit: allergies, medications, problem list, medical history, surgical history, family history, social history, and previous encounter notes.  Coral Ceo, CMA, am acting as transcriptionist for Coralie Common, MD.   I have reviewed the above documentation for accuracy and completeness, and I agree with the above. - Jinny Blossom, MD

## 2020-07-04 ENCOUNTER — Encounter: Payer: Self-pay | Admitting: Hematology and Oncology

## 2020-07-21 ENCOUNTER — Ambulatory Visit (INDEPENDENT_AMBULATORY_CARE_PROVIDER_SITE_OTHER): Payer: BC Managed Care – PPO | Admitting: Family Medicine

## 2020-08-04 DIAGNOSIS — H524 Presbyopia: Secondary | ICD-10-CM | POA: Diagnosis not present

## 2020-08-04 DIAGNOSIS — H2513 Age-related nuclear cataract, bilateral: Secondary | ICD-10-CM | POA: Diagnosis not present

## 2020-08-04 DIAGNOSIS — H43811 Vitreous degeneration, right eye: Secondary | ICD-10-CM | POA: Diagnosis not present

## 2020-08-04 DIAGNOSIS — H5051 Esophoria: Secondary | ICD-10-CM | POA: Diagnosis not present

## 2020-08-18 ENCOUNTER — Other Ambulatory Visit: Payer: Self-pay

## 2020-08-18 ENCOUNTER — Encounter (INDEPENDENT_AMBULATORY_CARE_PROVIDER_SITE_OTHER): Payer: Self-pay | Admitting: Family Medicine

## 2020-08-18 ENCOUNTER — Other Ambulatory Visit (INDEPENDENT_AMBULATORY_CARE_PROVIDER_SITE_OTHER): Payer: Self-pay | Admitting: Family Medicine

## 2020-08-18 ENCOUNTER — Ambulatory Visit (INDEPENDENT_AMBULATORY_CARE_PROVIDER_SITE_OTHER): Payer: BC Managed Care – PPO | Admitting: Family Medicine

## 2020-08-18 VITALS — BP 138/86 | HR 79 | Temp 98.4°F | Ht 65.0 in | Wt 166.0 lb

## 2020-08-18 DIAGNOSIS — R632 Polyphagia: Secondary | ICD-10-CM

## 2020-08-18 DIAGNOSIS — E669 Obesity, unspecified: Secondary | ICD-10-CM

## 2020-08-18 DIAGNOSIS — F419 Anxiety disorder, unspecified: Secondary | ICD-10-CM

## 2020-08-18 DIAGNOSIS — R030 Elevated blood-pressure reading, without diagnosis of hypertension: Secondary | ICD-10-CM | POA: Diagnosis not present

## 2020-08-18 DIAGNOSIS — E559 Vitamin D deficiency, unspecified: Secondary | ICD-10-CM

## 2020-08-18 DIAGNOSIS — Z9189 Other specified personal risk factors, not elsewhere classified: Secondary | ICD-10-CM | POA: Diagnosis not present

## 2020-08-18 DIAGNOSIS — F32A Depression, unspecified: Secondary | ICD-10-CM

## 2020-08-18 DIAGNOSIS — Z6831 Body mass index (BMI) 31.0-31.9, adult: Secondary | ICD-10-CM

## 2020-08-18 MED ORDER — DULOXETINE HCL 60 MG PO CPEP: 60.0000 mg | ORAL_CAPSULE | Freq: Every day | ORAL | 0 refills | Status: DC

## 2020-08-18 MED ORDER — VITAMIN D (ERGOCALCIFEROL) 1.25 MG (50000 UNIT) PO CAPS: 50000.0000 [IU] | ORAL_CAPSULE | ORAL | 0 refills | Status: DC

## 2020-08-21 MED ORDER — TOPIRAMATE 25 MG PO TABS
25.0000 mg | ORAL_TABLET | Freq: Every day | ORAL | 0 refills | Status: DC
Start: 2020-08-21 — End: 2020-11-05

## 2020-08-21 MED ORDER — LOMAIRA 8 MG PO TABS: 4.0000 mg | ORAL_TABLET | Freq: Every morning | ORAL | 0 refills | Status: DC

## 2020-08-25 NOTE — Progress Notes (Signed)
Chief Complaint:   OBESITY Jenna [name] is here to discuss her progress with her obesity treatment plan along with follow-up of her obesity related diagnoses. Jenna [name] is on the Category 3 Plan and states she is following her eating plan approximately 96% of the time. Jenna [name] states she is doing water aerobics for 2 hours 3 days a week and treadmill 60 minutes 5 times per week.  Today's visit was #: 28 Starting weight: 174 lbs Starting date: 04/18/2019 Today's weight: 166 lbs Today's date: 08/18/2020 Total lbs lost to date: 8 Total lbs lost since last in-office visit: 0  Interim History: Jenna [name] went to [place] for a 2 day trip and had grand babies for the entire month of July. She felt she really enjoyed her time with grandkids. She has really enjoyed following her meal plan, but is starting to feel frustrated with lack of weight loss. She has been doing water aerobics to get in resistance training.  Subjective:   1. Elevated blood pressure reading BP elevated last 2 appointments. The only change has been change in thyroid medications. Her last thyroid check was in May.  2. Polyphagia Jenna [name] has some weight gain even with meal plan compliance. She has been working on significant consistent exercise. PDMP checked, pregnancy test not needed. Contract signed.  3. Vitamin D deficiency Jenna [name] denies nausea, vomiting, and muscle weakness but notes fatigue. Pt is on prescription Vit D.  4. Anxiety and depression Pt denies suicidal or homicidal ideations. Symptoms slightly increased with anhedonia in the past few weeks.  5. At risk for side effect of medication Jenna [name] is at risk for side effects of medication due to starting Qsymia.  Assessment/Plan:   1. Elevated blood pressure reading Jenna [name] is working on healthy weight loss and exercise to improve blood pressure control. We will watch for signs of hypotension as she continues her lifestyle modifications. Follow up on BP at  next appointment.  Jenna [name] will start Phentermine 4 mg and start topiramate 25 mg QD. Intensive lifestyle modifications are the first line treatment for this issue. We discussed several lifestyle modifications today and she will continue to work on diet, exercise and weight loss efforts. Orders and follow up as documented in patient record.  Counseling Polyphagia is excessive hunger. Causes can include: low blood sugars, hypERthyroidism, PMS, lack of sleep, stress, insulin resistance, diabetes, certain medications, and diets that are deficient in protein and fiber.   Start- topiramate (TOPAMAX) 25 MG tablet; Take 1 tablet (25 mg total) by mouth daily.  Dispense: 30 tablet; Refill: 0 Start- Phentermine HCl (LOMAIRA) 8 MG TABS; Take 4 mg by mouth every morning.  Dispense: 28 tablet; Refill: 0  3. Vitamin D deficiency Low Vitamin D level contributes to fatigue and are associated with obesity, breast, and colon cancer. She agrees to continue to take prescription Vitamin D 50,000 IU every 14 days and will follow-up for routine testing of Vitamin D, at least 2-3 times per year to avoid over-replacement.  Refill- Vitamin D, Ergocalciferol, (DRISDOL) 1.25 MG (50000 UNIT) CAPS capsule; Take 1 capsule (50,000 Units total) by mouth every 14 (fourteen) days.  Dispense: 6 capsule; Refill: 0  4. Anxiety and depression Continue Cymbalta. Behavior modification techniques were discussed today to help Jenna [name] deal with her anxiety.  Orders and follow up as documented in patient record.   Refill- DULoxetine (CYMBALTA) 60 MG capsule; Take 1 capsule (60 mg total) by mouth daily.  Dispense: 30 capsule; Refill: 0  5. At  risk for side effect of medication Jenna [name] was given approximately 15 minutes of drug side effect counseling today.  We discussed side effect possibility and risk versus benefits. Jenna [name] agreed to the medication and will contact this office if these side effects are  intolerable.  Repetitive spaced learning was employed today to elicit superior memory formation and behavioral change.   6. Obesity with BMI of 30.4  Jenna [name] is currently in the action stage of change. As such, her goal is to continue with weight loss efforts. She has agreed to the Category 3 Plan.   Exercise goals:  As is  Behavioral modification strategies: increasing lean protein intake, meal planning and cooking strategies, and keeping healthy foods in the home.  Jenna [name] has agreed to follow-up with our clinic in 3 weeks. She was informed of the importance of frequent follow-up visits to maximize her success with intensive lifestyle modifications for her multiple health conditions.   Objective:   Blood pressure 138/86, pulse 79, temperature 98.4 F (36.9 C), height '5\' 5"'$  (1.651 m), weight 166 lb (75.3 kg), SpO2 97 %. Body mass index is 27.62 kg/m.  General: Cooperative, alert, well developed, in no acute distress. HEENT: Conjunctivae and lids unremarkable. Cardiovascular: Regular rhythm.  Lungs: Normal work of breathing. Neurologic: No focal deficits.   Lab Results  Component Value Date   CREATININE 0.64 10/30/2019   BUN 15 10/30/2019   NA 145 (H) 10/30/2019   K 5.1 10/30/2019   CL 104 10/30/2019   CO2 27 10/30/2019   Lab Results  Component Value Date   ALT 25 10/30/2019   AST 23 10/30/2019   ALKPHOS 84 10/30/2019   BILITOT 0.4 10/30/2019   Lab Results  Component Value Date   HGBA1C 5.2 03/24/2020   HGBA1C 5.1 10/30/2019   HGBA1C 5.2 04/18/2019   Lab Results  Component Value Date   INSULIN 9.5 03/24/2020   INSULIN 11.3 10/30/2019   INSULIN 11.3 04/18/2019   Lab Results  Component Value Date   TSH 0.79 05/13/2020   Lab Results  Component Value Date   CHOL 294 (H) 03/24/2020   HDL 80 03/24/2020   LDLCALC 188 (H) 03/24/2020   TRIG 144 03/24/2020   CHOLHDL 3.2 06/10/2017   Lab Results  Component Value Date   VD25OH 38.2 03/24/2020   VD25OH 53.7  10/30/2019   VD25OH 44.1 04/18/2019   Lab Results  Component Value Date   WBC 6.2 04/18/2019   HGB 14.5 04/18/2019   HCT 44.5 04/18/2019   MCV 91 04/18/2019   PLT 284 04/18/2019    Attestation Statements:   Reviewed by clinician on day of visit: allergies, medications, problem list, medical history, surgical history, family history, social history, and previous encounter notes.  Coral Ceo, CMA, am acting as transcriptionist for Coralie Common, MD.   I have reviewed the above documentation for accuracy and completeness, and I agree with the above. - Coralie Common, MD

## 2020-08-29 ENCOUNTER — Encounter: Payer: Self-pay | Admitting: Internal Medicine

## 2020-08-29 ENCOUNTER — Other Ambulatory Visit: Payer: Self-pay

## 2020-08-29 ENCOUNTER — Ambulatory Visit: Payer: BC Managed Care – PPO | Admitting: Internal Medicine

## 2020-08-29 VITALS — BP 140/90 | HR 75 | Ht 62.0 in | Wt 170.0 lb

## 2020-08-29 DIAGNOSIS — E063 Autoimmune thyroiditis: Secondary | ICD-10-CM | POA: Diagnosis not present

## 2020-08-29 DIAGNOSIS — E038 Other specified hypothyroidism: Secondary | ICD-10-CM

## 2020-08-29 LAB — T4, FREE: Free T4: 1.1 ng/dL (ref 0.60–1.60)

## 2020-08-29 LAB — TSH: TSH: 0.39 u[IU]/mL (ref 0.35–5.50)

## 2020-08-29 LAB — T3, FREE: T3, Free: 3.2 pg/mL (ref 2.3–4.2)

## 2020-08-29 MED ORDER — LEVOTHYROXINE SODIUM 100 MCG PO TABS: 100.0000 ug | ORAL_TABLET | Freq: Every day | ORAL | 3 refills | Status: DC

## 2020-08-29 NOTE — Patient Instructions (Addendum)
Please continue Levothyroxine 112 mcg daily.  Restart:  - Multivitamin - Selenium 200 mcg daily  Take the thyroid hormone every day, with water, at least 30 minutes before breakfast, separated by at least 4 hours from: - acid reflux medications - calcium - iron - multivitamins  Please stop at the lab.  Please come back for a follow-up appointment in 6 months.

## 2020-08-29 NOTE — Progress Notes (Signed)
Patient ID: GERALDINE ALLAIN, female   DOB: 1960/12/17, 60 y.o.   MRN: [id number]   This visit occurred during the SARS-CoV-2 public health emergency.  Safety protocols were in place, including screening questions prior to the visit, additional usage of staff PPE, and extensive cleaning of exam room while observing appropriate contact time as indicated for disinfecting solutions.   HPI  Rabecca SANNIYAH DALTO is a 60 y.o.-year-old female, referred by Dr. Adair Patter, for management of Hashimoto's hypothyroidism.  Last visit 3 months ago.  Interim history: At this visit, patient complains of increased fatigue,exhaustion, knee pain, weight gain, elevated BP (at the latest visit with PCP). Also, blurry vision, heat intolerance, some tremors.  She is not sure why she has the above symptoms, but they started approximately at the same time, we stopped her liothyronine. She has chronic anxiety for which she takes Cymbalta. [facility]. She stopped her multivitamin since last visit.  Reviewed and addended history: She was diagnosed with Hashimoto's hypothyroidism in 2015-2014 after she described lethargy, inability to lose weight, hair loss.  She was started on Levothyroxine (LT4) initially, but quickly changed to liothyronine (LT3) 50 mcg daily >> then changed to 25 mcg daily in 04/2019 (equivalent of 100 mcg levothyroxine daily).  On this regimen, her TSH was suppressed.  In 05/2019, we changed to: - LT4 50 mcg daily - LT3 12.5 mcg daily For an equivalent of LT4 of 100 mcg daily  In 06/2019, we changed to: - LT4 88 mcg daily - LT3 5 mcg daily For an equivalent of LT4 of 108 mcg daily  In 11/2019, we changed to:  - LT4 100 mcg daily  - LT3 2.5 mcg daily  For an equivalent of 110 mcg daily LT4  In 05/2019, we changed to: - LT4 112 mcg daily   She felt much better after we started to decrease her LT3 dose but she started to feel worse after stopping it (see  above).  She takes the thyroid hormones: - previously at night (2-3 hours after dinner) >> switched to a.m. - black coffee - no Calcium - stopped MVI - prev. at night - prev.  On omeprazole twice a day, now off - prev.  On biotin, now off - on fiber gummies at night  I reviewed her TFTs: Lab Results  Component Value Date   TSH 0.79 05/13/2020   TSH 3.22 11/12/2019   TSH 2.36 06/21/2019   TSH 2.15 05/14/2019   TSH 0.018 (L) 04/18/2019   TSH 6.24 (H) 12/08/2018   TSH 0.43 06/10/2017   TSH 7.16 (H) 07/16/2016   TSH 0.10 (L) 08/12/2015   TSH 1.885 10/28/2014   FREET4 1.09 05/13/2020   FREET4 0.77 11/12/2019   FREET4 0.77 06/21/2019   FREET4 0.21 (L) 05/14/2019   FREET4 0.20 (L) 04/18/2019   FREET4 1.2 08/12/2015    Her thyroid antibodies were elevated: Component     Latest Ref Rng & Units 11/12/2019  Thyroperoxidase Ab SerPl-aCnc     <9 IU/mL 335 (H)  Thyroglobulin Ab     < or = 1 IU/mL 16 (H)   Component     Latest Ref Rng & Units 05/14/2019  Thyroperoxidase Ab SerPl-aCnc     <9 IU/mL 379 (H)  Thyroglobulin Ab     < or = 1 IU/mL 13 (H)  Previously: 08/12/2015: ATA 14 (<2), TPO 162 ( (<9)  In 05/2019, I advised her to start selenium 200 mcg daily.  Her antibiotics did not decrease on this, so we stopped it.  Pt denies: - feeling nodules in neck - hoarseness - dysphagia - choking - SOB with lying down  No FH of thyroid cancer. + FH of autoimmune ds.: sister and brother. Father with SLE. P cousins with Hashimoto's thyroiditis.  No h/o radiation tx to head or neck. No herbal supplements. No Biotin use. No recent steroids use.   Pt. also has a history of breast cancer.  She is on anastrozole -she completed 5 years and will complete 2 more.  She walks and swims for exercise.  ROS: + See HPI  Past Medical History:  Diagnosis Date   Anxiety    Asthma    Breast cancer ([place]) 2017   Left Breast Cancer   Breast cancer of lower-outer quadrant of left female breast  ([place]) [id number]   Complication of anesthesia    hard time waking up with gallbladder   Depression    Gallbladder problem    GERD (gastroesophageal reflux disease)    Hashimoto's disease    Heartburn    History of external beam radiation therapy 03/10/16-04/23/16   left breast/50.4 Gy in 28 fractions, left breast boost/10 Gy in 5 fractions   Hypothyroidism    Irritable bowel    Joint pain    Personal history of chemotherapy 2017-2018   Left Breast Cancer   Personal history of radiation therapy 2018   Left Breast Cancer   Uterine displacement    Past Surgical History:  Procedure Laterality Date   BACK SURGERY     BREAST BIOPSY Left 09/10/2015   BREAST LUMPECTOMY Left 09/25/2015   BREAST LUMPECTOMY WITH NEEDLE LOCALIZATION AND AXILLARY SENTINEL LYMPH NODE BX Left 09/25/2015   Procedure: BREAST LUMPECTOMY WITH NEEDLE LOCALIZATION X'S 2 AND AXILLARY SENTINEL LYMPH NODE BX;  Surgeon: Erroll Luna, MD;  Location: [facility];  Service: General;  Laterality: Left;   CHOLECYSTECTOMY OPEN     COLONOSCOPY WITH PROPOFOL N/A 04/13/2019   Procedure: COLONOSCOPY WITH PROPOFOL;  Surgeon: Jonathon Bellows, MD;  Location: [facility] ENDOSCOPY;  Service: Gastroenterology;  Laterality: N/A;   disk fusion     ENDOMETRIAL ABLATION     MASTOPEXY Bilateral 10/25/2016   Procedure: BILATERAL MASTOPEXY;  Surgeon: Irene Limbo, MD;  Location: [facility];  Service: Plastics;  Laterality: Bilateral;   neck fusion     PORT-A-CATH REMOVAL Right 10/25/2016   Procedure: RIGHT CHEST PORT REMOVAL;  Surgeon: Irene Limbo, MD;  Location: [facility];  Service: Plastics;  Laterality: Right;   PORTACATH PLACEMENT Right 09/25/2015   Procedure: INSERTION PORT-A-CATH;  Surgeon: Erroll Luna, MD;  Location: [facility];  Service: General;  Laterality: Right;   REDUCTION MAMMAPLASTY Bilateral 2018   Social History   Socioeconomic History   Marital status: Married     Spouse name: Not on file   Number of children: 2   Years of education: Not on file   Highest education level: Not on file  Occupational History   Occupation: n/a  Tobacco Use   Smoking status: Former    Years: 15.00    Types: Cigarettes    Quit date: 1986    Years since quitting: 36.6   Smokeless tobacco: Never  Vaping Use   Vaping Use: Never used  Substance and Sexual Activity   Alcohol use: Yes    Alcohol/week: 2.0 - 3.0 standard drinks    Types: 2 - 3 Standard drinks or equivalent per week  Comment: social   Drug use: No   Sexual activity: Yes    Partners: Male  Other Topics Concern   Not on file  Social History Narrative   Not on file   Social Determinants of Health   Financial Resource Strain: Not on file  Food Insecurity: Not on file  Transportation Needs: Not on file  Physical Activity: Not on file  Stress: Not on file  Social Connections: Not on file  Intimate Partner Violence: Not on file   Current Outpatient Medications on File Prior to Visit  Medication Sig Dispense Refill   acetaminophen (TYLENOL) 500 MG tablet Take 500 mg by mouth every 6 (six) hours as needed.     anastrozole (ARIMIDEX) 1 MG tablet TAKE 1 TABLET BY MOUTH EVERY DAY 90 tablet 3   Calcium Carbonate-Vit D-Min (CALCIUM 1200 PO) Take by mouth.     Cholecalciferol (VITAMIN D3) 1000 units CAPS Take 1,000 Int'l Units by mouth. 2 capsules per day     DULoxetine (CYMBALTA) 60 MG capsule Take 1 capsule (60 mg total) by mouth daily. 30 capsule 0   levothyroxine (SYNTHROID) 112 MCG tablet Take 1 tablet (112 mcg total) by mouth daily. 45 tablet 3   Magnesium 500 MG CAPS Take 500 mg by mouth.     Multiple Vitamin (MULTIVITAMIN) tablet Take 1 tablet by mouth daily.     Phentermine HCl (LOMAIRA) 8 MG TABS Take 4 mg by mouth every morning. 28 tablet 0   topiramate (TOPAMAX) 25 MG tablet Take 1 tablet (25 mg total) by mouth daily. 30 tablet 0   Vitamin D, Ergocalciferol, (DRISDOL) 1.25 MG (50000  UNIT) CAPS capsule Take 1 capsule (50,000 Units total) by mouth every 14 (fourteen) days. 6 capsule 0   No current facility-administered medications on file prior to visit.   Allergies  Allergen Reactions   Latex Rash   Sulfa Antibiotics Rash   Family History  Problem Relation Age of Onset   Diabetes Mother    Hypertension Mother    Hyperlipidemia Mother    Kidney disease Mother    Obesity Mother    Cancer Maternal Grandfather    Obesity Father    Thyroid disease Other   Also, please see HPI. HTN in oldest brother.  PE: BP 140/90 (BP Location: Right Arm, Patient Position: Sitting, Cuff Size: Normal)   Pulse 75   Ht '5\' 5"'$  (1.651 m)   Wt 170 lb (77.1 kg)   SpO2 96%   BMI 28.29 kg/m  Wt Readings from Last 3 Encounters:  08/29/20 170 lb (77.1 kg)  08/18/20 166 lb (75.3 kg)  06/23/20 164 lb (74.4 kg)   Constitutional: overweight, in NAD Eyes: PERRLA, EOMI, no exophthalmos ENT: moist mucous membranes, no thyromegaly, no cervical lymphadenopathy Cardiovascular: RRR, No MRG Respiratory: CTA B Gastrointestinal: abdomen soft, NT, ND, BS+ Musculoskeletal: no deformities, strength intact in all 4 Skin: moist, warm, no rashes Neurological: no tremor with outstretched hands, DTR normal in all 4  ASSESSMENT: 1.  Hypothyroidism due to Hashimoto's thyroiditis  PLAN: 1.  Patient with history of Hashimoto's thyroiditis, with subsequent hypothyroidism, on replacement with only liothyronine (LT3) at our first visit.  She has previously on this for a long time with the dose reduced from 50 mcg daily (equivalent LT4 dose of 200 mcg daily) to 25 mcg daily (equivalent LT4 dose of 100 mcg daily).  Her TSH was suppressed on this dose.  After discussion about side effects of using only liothyronine including sleeping problems,  hot flashes, stimulatory effect on the heart and nervous system and also long-term osteoporosis especially if the TSH is suppressed, she agreed to reduce LT3 dose and we  were finally able to stop 05/2020.  At that time, we increased her LT4 dose to 112 mcg daily.  She was on selenium 200 mcg daily, which we used in an effort to decrease her TPO and ATA antibodies, but these were still elevated after starting selenium so she is now off the supplement. - after stopping LT3 she did not return for labs 1.5 months after starting LT3, as advised, and she forgot - latest thyroid labs reviewed with pt. >> normal: Lab Results  Component Value Date   TSH 0.79 05/13/2020  - she continues on LT4 112 mcg daily - pt tells me that she feels much worse after our last visit (see HPI), and is not sure whether stopping LT3 may have contributed - we discussed about taking the thyroid hormone every day, with water, >30 minutes before breakfast, separated by >4 hours from acid reflux medications, calcium, iron, multivitamins. Pt. is taking it correctly. - will check thyroid tests today: TSH, free T3 and free T4.  If TFTs are abnormal, will need to change levothyroxine dose.  However, if they are normal, we can try to switch back to adding a low-dose Cytomel and see how she feels.  I also recommended to have back multivitamins and selenium. - If labs are abnormal, she will need to return for repeat TFTs in 1.5 months -Otherwise, we will see her back in 6 months  Component     Latest Ref Rng & Units 08/29/2020  TSH     0.35 - 5.50 uIU/mL 0.39  T4,Free(Direct)     0.60 - 1.60 ng/dL 1.10  Triiodothyronine,Free,Serum     2.3 - 4.2 pg/mL 3.2  TSH slightly low in the normal range.  Would suggest to back off levothyroxine to only 100 mcg daily and recheck her thyroid tests in 5 weeks.  Philemon Kingdom, MD PhD [facility]

## 2020-09-14 ENCOUNTER — Other Ambulatory Visit (INDEPENDENT_AMBULATORY_CARE_PROVIDER_SITE_OTHER): Payer: Self-pay | Admitting: Family Medicine

## 2020-09-14 DIAGNOSIS — E559 Vitamin D deficiency, unspecified: Secondary | ICD-10-CM

## 2020-09-15 ENCOUNTER — Ambulatory Visit (INDEPENDENT_AMBULATORY_CARE_PROVIDER_SITE_OTHER): Payer: BC Managed Care – PPO | Admitting: Family Medicine

## 2020-09-16 ENCOUNTER — Other Ambulatory Visit (INDEPENDENT_AMBULATORY_CARE_PROVIDER_SITE_OTHER): Payer: Self-pay | Admitting: Family Medicine

## 2020-09-16 DIAGNOSIS — R632 Polyphagia: Secondary | ICD-10-CM

## 2020-10-22 ENCOUNTER — Other Ambulatory Visit: Payer: Self-pay | Admitting: Hematology and Oncology

## 2020-10-22 DIAGNOSIS — Z853 Personal history of malignant neoplasm of breast: Secondary | ICD-10-CM

## 2020-11-04 ENCOUNTER — Other Ambulatory Visit (INDEPENDENT_AMBULATORY_CARE_PROVIDER_SITE_OTHER): Payer: Self-pay | Admitting: Family Medicine

## 2020-11-04 ENCOUNTER — Encounter: Payer: Self-pay | Admitting: Physician Assistant

## 2020-11-04 DIAGNOSIS — F32A Depression, unspecified: Secondary | ICD-10-CM

## 2020-11-04 DIAGNOSIS — F419 Anxiety disorder, unspecified: Secondary | ICD-10-CM

## 2020-11-04 NOTE — Telephone Encounter (Signed)
Pt scheduled  

## 2020-11-05 ENCOUNTER — Other Ambulatory Visit: Payer: Self-pay

## 2020-11-05 ENCOUNTER — Ambulatory Visit (INDEPENDENT_AMBULATORY_CARE_PROVIDER_SITE_OTHER): Payer: BC Managed Care – PPO | Admitting: Physician Assistant

## 2020-11-05 VITALS — BP 157/93 | HR 88 | Ht 62.0 in | Wt 172.0 lb

## 2020-11-05 DIAGNOSIS — F419 Anxiety disorder, unspecified: Secondary | ICD-10-CM | POA: Diagnosis not present

## 2020-11-05 DIAGNOSIS — F32A Depression, unspecified: Secondary | ICD-10-CM

## 2020-11-05 DIAGNOSIS — Z0000 Encounter for general adult medical examination without abnormal findings: Secondary | ICD-10-CM | POA: Diagnosis not present

## 2020-11-05 DIAGNOSIS — Z131 Encounter for screening for diabetes mellitus: Secondary | ICD-10-CM

## 2020-11-05 DIAGNOSIS — R030 Elevated blood-pressure reading, without diagnosis of hypertension: Secondary | ICD-10-CM

## 2020-11-05 DIAGNOSIS — E875 Hyperkalemia: Secondary | ICD-10-CM

## 2020-11-05 DIAGNOSIS — Z13220 Encounter for screening for lipoid disorders: Secondary | ICD-10-CM

## 2020-11-05 MED ORDER — DULOXETINE HCL 30 MG PO CPEP: 30.0000 mg | ORAL_CAPSULE | Freq: Every day | ORAL | 1 refills | Status: DC

## 2020-11-05 MED ORDER — LISINOPRIL 5 MG PO TABS: 5.0000 mg | ORAL_TABLET | Freq: Every day | ORAL | 1 refills | Status: DC

## 2020-11-05 NOTE — Progress Notes (Signed)
Subjective:     Jenna [name] is a 60 y.o. female and is here for a comprehensive physical exam. The patient reports problems - she has some ongoing anxiety and depression. Her and her husband are in marriage counseling. Her husband wants her off anti-depressants. Counseling is helping and she wants to talk about it.  Social History   Socioeconomic History   Marital status: Married    Spouse name: Not on file   Number of children: 2   Years of education: Not on file   Highest education level: Not on file  Occupational History   Occupation: n/a  Tobacco Use   Smoking status: Former    Years: 15.00    Types: Cigarettes    Quit date: 1986    Years since quitting: 36.8   Smokeless tobacco: Never  Vaping Use   Vaping Use: Never used  Substance and Sexual Activity   Alcohol use: Yes    Alcohol/week: 2.0 - 3.0 standard drinks    Types: 2 - 3 Standard drinks or equivalent per week    Comment: social   Drug use: No   Sexual activity: Yes    Partners: Male  Other Topics Concern   Not on file  Social History Narrative   Not on file   Social Determinants of Health   Financial Resource Strain: Not on file  Food Insecurity: Not on file  Transportation Needs: Not on file  Physical Activity: Not on file  Stress: Not on file  Social Connections: Not on file  Intimate Partner Violence: Not on file   Health Maintenance  Topic Date Due   Pneumococcal Vaccine 48-5 Years old (1 - PCV) 11/05/2021 (Originally 01/21/1966)   COVID-19 Vaccine (5 - Booster for [place] series) 12/24/2020   PAP SMEAR-Modifier  07/16/2021   MAMMOGRAM  10/14/2021   TETANUS/TDAP  02/26/2024   COLONOSCOPY (Pts 45-29yrs Insurance coverage will need to be confirmed)  04/12/2024   INFLUENZA VACCINE  Completed   Hepatitis C Screening  Completed   HIV Screening  Completed   Zoster Vaccines- Shingrix  Completed   HPV VACCINES  Aged Out    The following portions of the patient's history were reviewed and  updated as appropriate: allergies, current medications, past family history, past medical history, past social history, past surgical history, and problem list.  Review of Systems Pertinent items noted in HPI and remainder of comprehensive ROS otherwise negative.   Objective:    BP (!) 157/93   Pulse 88   Ht 5\' 2"  (1.575 m)   Wt 172 lb (78 kg)   SpO2 100%   BMI 31.46 kg/m  General appearance: alert, cooperative, appears stated age, and mildly obese Head: Normocephalic, without obvious abnormality, atraumatic Eyes: conjunctivae/corneas clear. PERRL, EOM's intact. Fundi benign. Ears: normal TM's and external ear canals both ears Nose: Nares normal. Septum midline. Mucosa normal. No drainage or sinus tenderness. Throat: lips, mucosa, and tongue normal; teeth and gums normal Neck: no adenopathy, no carotid bruit, no JVD, supple, symmetrical, trachea midline, and thyroid not enlarged, symmetric, no tenderness/mass/nodules Back: symmetric, no curvature. ROM normal. No CVA tenderness. Lungs: clear to auscultation bilaterally Heart: regular rate and rhythm, S1, S2 normal, no murmur, click, rub or gallop Abdomen: soft, non-tender; bowel sounds normal; no masses,  no organomegaly Extremities: extremities normal, atraumatic, no cyanosis or edema Pulses: 2+ and symmetric Skin: Skin color, texture, turgor normal. No rashes or lesions Lymph nodes: Cervical, supraclavicular, and axillary nodes normal. Neurologic: Grossly normal   ..  Depression screen [facility] 2/9 11/05/2020 04/18/2019 12/12/2018 06/10/2017 07/16/2016  Decreased Interest 3 3 2 1 2   Down, Depressed, Hopeless 2 3 3 2 2   PHQ - 2 Score 5 6 5 3 4   Altered sleeping 1 2 3 3 2   Tired, decreased energy 2 2 3 1 2   Change in appetite 2 1 2  0 3  Feeling bad or failure about yourself  2 0 2 2 2   Trouble concentrating 2 0 3 1 1   Moving slowly or fidgety/restless 1 0 2 1 2   Suicidal thoughts 0 0 0 0 0  PHQ-9 Score 15 11 20 11 16   Difficult doing  work/chores Somewhat difficult Not difficult at all Somewhat difficult - -  Some recent data might be hidden   .Marland Kitchen GAD 7 : Generalized Anxiety Score 11/05/2020 12/12/2018 06/10/2017  Nervous, Anxious, on Edge 1 3 1   Control/stop worrying 2 3 1   Worry too much - different things 2 3 1   Trouble relaxing 1 3 2   Restless 0 1 1  Easily annoyed or irritable 1 3 2   Afraid - awful might happen 1 2 2   Total GAD 7 Score 8 18 10   Anxiety Difficulty Somewhat difficult Not difficult at all Somewhat difficult     Assessment:    Healthy female exam.     Plan:  Marland KitchenMarland KitchenCarolann was seen today for annual exam.  Diagnoses and all orders for this visit:  Routine physical examination -     COMPLETE METABOLIC PANEL WITH GFR -     Lipid Panel w/reflex Direct LDL  Anxiety and depression -     DULoxetine (CYMBALTA) 30 MG capsule; Take 1 capsule (30 mg total) by mouth daily.  Screening for diabetes mellitus -     COMPLETE METABOLIC PANEL WITH GFR  Screening for lipid disorders -     Lipid Panel w/reflex Direct LDL  Elevated BP without diagnosis of hypertension -     lisinopril (ZESTRIL) 5 MG tablet; Take 1 tablet (5 mg total) by mouth daily. .. Discussed 150 minutes of exercise a week.  Encouraged vitamin D 1000 units and Calcium 1300mg  or 4 servings of dairy a day.  Fasting labs ordered today.  PHQ and GAD not to goal. I do not think a good idea to come off medications. Ok to decrease to see difference. 30mg  sent to pharmacy.  BP elevated. Start lisinopril 5mg . Recheck 1 month.  Discussed weight loss, low salt diet and regular exercise.  Colonoscopy UTD.  Mammogram scheduled. Pap UTD. Flu shot UTD.  Covid vaccine UTD.  Shingrix UTD.     See After Visit Summary for Counseling Recommendations

## 2020-11-05 NOTE — Patient Instructions (Signed)
Health Maintenance, Female Adopting a healthy lifestyle and getting preventive care are important in promoting health and wellness. Ask your health care provider about: The right schedule for you to have regular tests and exams. Things you can do on your own to prevent diseases and keep yourself healthy. What should I know about diet, weight, and exercise? Eat a healthy diet  Eat a diet that includes plenty of vegetables, fruits, low-fat dairy products, and lean protein. Do not eat a lot of foods that are high in solid fats, added sugars, or sodium. Maintain a healthy weight Body mass index (BMI) is used to identify weight problems. It estimates body fat based on height and weight. Your health care provider can help determine your BMI and help you achieve or maintain a healthy weight. Get regular exercise Get regular exercise. This is one of the most important things you can do for your health. Most adults should: Exercise for at least 150 minutes each week. The exercise should increase your heart rate and make you sweat (moderate-intensity exercise). Do strengthening exercises at least twice a week. This is in addition to the moderate-intensity exercise. Spend less time sitting. Even light physical activity can be beneficial. Watch cholesterol and blood lipids Have your blood tested for lipids and cholesterol at 58 years of age, then have this test every 5 years. Have your cholesterol levels checked more often if: Your lipid or cholesterol levels are high. You are older than 58 years of age. You are at high risk for heart disease. What should I know about cancer screening? Depending on your health history and family history, you may need to have cancer screening at various ages. This may include screening for: Breast cancer. Cervical cancer. Colorectal cancer. Skin cancer. Lung cancer. What should I know about heart disease, diabetes, and high blood pressure? Blood pressure and heart  disease High blood pressure causes heart disease and increases the risk of stroke. This is more likely to develop in people who have high blood pressure readings, are of African descent, or are overweight. Have your blood pressure checked: Every 3-5 years if you are 18-39 years of age. Every year if you are 40 years old or older. Diabetes Have regular diabetes screenings. This checks your fasting blood sugar level. Have the screening done: Once every three years after age 40 if you are at a normal weight and have a low risk for diabetes. More often and at a younger age if you are overweight or have a high risk for diabetes. What should I know about preventing infection? Hepatitis B If you have a higher risk for hepatitis B, you should be screened for this virus. Talk with your health care provider to find out if you are at risk for hepatitis B infection. Hepatitis C Testing is recommended for: Everyone born from 1945 through 1965. Anyone with known risk factors for hepatitis C. Sexually transmitted infections (STIs) Get screened for STIs, including gonorrhea and chlamydia, if: You are sexually active and are younger than 58 years of age. You are older than 58 years of age and your health care provider tells you that you are at risk for this type of infection. Your sexual activity has changed since you were last screened, and you are at increased risk for chlamydia or gonorrhea. Ask your health care provider if you are at risk. Ask your health care provider about whether you are at high risk for HIV. Your health care provider may recommend a prescription medicine   to help prevent HIV infection. If you choose to take medicine to prevent HIV, you should first get tested for HIV. You should then be tested every 3 months for as long as you are taking the medicine. Pregnancy If you are about to stop having your period (premenopausal) and you may become pregnant, seek counseling before you get  pregnant. Take 400 to 800 micrograms (mcg) of folic acid every day if you become pregnant. Ask for birth control (contraception) if you want to prevent pregnancy. Osteoporosis and menopause Osteoporosis is a disease in which the bones lose minerals and strength with aging. This can result in bone fractures. If you are 65 years old or older, or if you are at risk for osteoporosis and fractures, ask your health care provider if you should: Be screened for bone loss. Take a calcium or vitamin D supplement to lower your risk of fractures. Be given hormone replacement therapy (HRT) to treat symptoms of menopause. Follow these instructions at home: Lifestyle Do not use any products that contain nicotine or tobacco, such as cigarettes, e-cigarettes, and chewing tobacco. If you need help quitting, ask your health care provider. Do not use street drugs. Do not share needles. Ask your health care provider for help if you need support or information about quitting drugs. Alcohol use Do not drink alcohol if: Your health care provider tells you not to drink. You are pregnant, may be pregnant, or are planning to become pregnant. If you drink alcohol: Limit how much you use to 0-1 drink a day. Limit intake if you are breastfeeding. Be aware of how much alcohol is in your drink. In the [place] one drink equals one 12 oz bottle of beer (355 mL), one 5 oz glass of wine (148 mL), or one 1 oz glass of hard liquor (44 mL). General instructions Schedule regular health, dental, and eye exams. Stay current with your vaccines. Tell your health care provider if: You often feel depressed. You have ever been abused or do not feel safe at home. Summary Adopting a healthy lifestyle and getting preventive care are important in promoting health and wellness. Follow your health care provider's instructions about healthy diet, exercising, and getting tested or screened for diseases. Follow your health care provider's  instructions on monitoring your cholesterol and blood pressure. This information is not intended to replace advice given to you by your health care provider. Make sure you discuss any questions you have with your health care provider. Document Revised: 02/29/2020 Document Reviewed: 12/14/2017 Elsevier Patient Education  2022 Elsevier Inc.  

## 2020-11-06 ENCOUNTER — Other Ambulatory Visit: Payer: Self-pay | Admitting: Internal Medicine

## 2020-11-06 LAB — COMPLETE METABOLIC PANEL WITH GFR
AG Ratio: 1.4 (calc) (ref 1.0–2.5)
ALT: 27 U/L (ref 6–29)
AST: 24 U/L (ref 10–35)
Albumin: 4.4 g/dL (ref 3.6–5.1)
Alkaline phosphatase (APISO): 74 U/L (ref 37–153)
BUN: 18 mg/dL (ref 7–25)
CO2: 29 mmol/L (ref 20–32)
Calcium: 9.9 mg/dL (ref 8.6–10.4)
Chloride: 103 mmol/L (ref 98–110)
Creat: 0.72 mg/dL (ref 0.50–1.03)
Globulin: 3.1 g/dL (calc) (ref 1.9–3.7)
Glucose, Bld: 82 mg/dL (ref 65–139)
Potassium: 5.5 mmol/L — ABNORMAL HIGH (ref 3.5–5.3)
Sodium: 141 mmol/L (ref 135–146)
Total Bilirubin: 0.3 mg/dL (ref 0.2–1.2)
Total Protein: 7.5 g/dL (ref 6.1–8.1)
eGFR: 97 mL/min/{1.73_m2} (ref >=60)

## 2020-11-06 LAB — LIPID PANEL W/REFLEX DIRECT LDL
Cholesterol: 251 mg/dL — ABNORMAL HIGH (ref <200)
HDL: 66 mg/dL (ref >=50)
LDL Cholesterol (Calc): 136 mg/dL (calc) — ABNORMAL HIGH
Non-HDL Cholesterol (Calc): 185 mg/dL (calc) — ABNORMAL HIGH (ref <130)
Total CHOL/HDL Ratio: 3.8 (calc) (ref <5.0)
Triglycerides: 343 mg/dL — ABNORMAL HIGH (ref <150)

## 2020-11-07 ENCOUNTER — Encounter: Payer: Self-pay | Admitting: Physician Assistant

## 2020-11-07 NOTE — Progress Notes (Signed)
Jenna [name],   You glucose, kidney, and liver look good.  Your potassium is just a bit elevated. Have you added lisinopril for BP. It can also cause potassium to elevate. I will have to add a medication to compliment the effects.  Taking any supplement with potassium?  HDL, good cholesterol, looks great.  LDL, bad cholesterol, is elevated.  TG are elevated. Are you taking fish oil?

## 2020-11-10 ENCOUNTER — Encounter: Payer: Self-pay | Admitting: Physician Assistant

## 2020-11-10 MED ORDER — HYDROCHLOROTHIAZIDE 12.5 MG PO TABS: 12.5000 mg | ORAL_TABLET | Freq: Every day | ORAL | 1 refills | Status: DC

## 2020-11-14 ENCOUNTER — Other Ambulatory Visit (INDEPENDENT_AMBULATORY_CARE_PROVIDER_SITE_OTHER): Payer: Self-pay | Admitting: Family Medicine

## 2020-11-14 DIAGNOSIS — E559 Vitamin D deficiency, unspecified: Secondary | ICD-10-CM

## 2020-11-18 ENCOUNTER — Other Ambulatory Visit: Payer: Self-pay

## 2020-11-18 ENCOUNTER — Ambulatory Visit: Payer: BC Managed Care – PPO | Admitting: Internal Medicine

## 2020-11-18 ENCOUNTER — Encounter: Payer: Self-pay | Admitting: Internal Medicine

## 2020-11-18 VITALS — BP 132/82 | HR 82 | Ht 62.0 in | Wt 173.4 lb

## 2020-11-18 DIAGNOSIS — E063 Autoimmune thyroiditis: Secondary | ICD-10-CM

## 2020-11-18 DIAGNOSIS — E038 Other specified hypothyroidism: Secondary | ICD-10-CM

## 2020-11-18 LAB — TSH: TSH: 2.22 u[IU]/mL (ref 0.35–5.50)

## 2020-11-18 LAB — T4, FREE: Free T4: 1.12 ng/dL (ref 0.60–1.60)

## 2020-11-18 MED ORDER — LEVOTHYROXINE SODIUM 100 MCG PO TABS
100.0000 ug | ORAL_TABLET | Freq: Every day | ORAL | 3 refills | Status: DC
Start: 2020-11-18 — End: 2021-06-02

## 2020-11-18 NOTE — Progress Notes (Signed)
Patient ID: Jenna [name], female   DOB: January 12, 1960, 60 y.o.   MRN: [id number]   This visit occurred during the SARS-CoV-2 public health emergency.  Safety protocols were in place, including screening questions prior to the visit, additional usage of staff PPE, and extensive cleaning of exam room while observing appropriate contact time as indicated for disinfecting solutions.   HPI  Merridith BESSY REANEY is a 60 y.o.-year-old female, initially referred by Dr. Adair Patter, returning for follow-up forHashimoto's hypothyroidism.  Last visit 3 months ago.  Interim history: At last visit she described increased fatigue, exhaustion, knee pain, weight gain, high blood pressure, blurry vision, intolerance, tremors.  They started around the time when we stopped liothyronine. She continues to take Cymbalta for chronic anxiety, now trying to taper off. Now in therapy. She was started on BP medication and a diuretic.  Before last visit she started to go to the weight management clinic, but then stopped. She plans to return. She feels much better at this visit, feels her thyroid is regulated well.  Reviewed and addended history: She was diagnosed with Hashimoto's hypothyroidism in 2015-2014 after she described lethargy, inability to lose weight, hair loss.  She was started on Levothyroxine (LT4) initially, but quickly changed to liothyronine (LT3) 50 mcg daily >> then changed to 25 mcg daily in 04/2019 (equivalent of 100 mcg levothyroxine daily).  On this regimen, her TSH was suppressed.  In 05/2019, we changed to: - LT4 50 mcg daily - LT3 12.5 mcg daily For an equivalent of LT4 of 100 mcg daily  In 06/2019, we changed to: - LT4 88 mcg daily - LT3 5 mcg daily For an equivalent of LT4 of 108 mcg daily  In 11/2019, we changed to:  - LT4 100 mcg daily  - LT3 2.5 mcg daily  For an equivalent of 110 mcg daily LT4  In 05/2019, we changed to: - LT4 112 mcg daily  In 08/2020, we decreased the dose to: -  LT4 100 mcg daily   She felt much better after we started to decrease her LT3 dose but in 08/2020 she was feeling worse.  She takes the thyroid hormones: - previously at night (2-3 hours after dinner) >> switched to a.m. - with a full glass of water - b'fast after 2-3 hours after - black coffee - no Calcium - + MVI - restarted, at night - prev.  On omeprazole twice a day, now off - prev.  On biotin, now off - on Selenium 200 mcg with b'fast - started after last OV  I reviewed her TFTs: Lab Results  Component Value Date   TSH 0.39 08/29/2020   TSH 0.79 05/13/2020   TSH 3.22 11/12/2019   TSH 2.36 06/21/2019   TSH 2.15 05/14/2019   TSH 0.018 (L) 04/18/2019   TSH 6.24 (H) 12/08/2018   TSH 0.43 06/10/2017   TSH 7.16 (H) 07/16/2016   TSH 0.10 (L) 08/12/2015   FREET4 1.10 08/29/2020   FREET4 1.09 05/13/2020   FREET4 0.77 11/12/2019   FREET4 0.77 06/21/2019   FREET4 0.21 (L) 05/14/2019   FREET4 0.20 (L) 04/18/2019   FREET4 1.2 08/12/2015    Her thyroid antibodies were elevated: Component     Latest Ref Rng & Units 11/12/2019  Thyroperoxidase Ab SerPl-aCnc     <9 IU/mL 335 (H)  Thyroglobulin Ab     < or = 1 IU/mL 16 (H)   Component     Latest Ref Rng & Units 05/14/2019  Thyroperoxidase  Ab SerPl-aCnc     <9 IU/mL 379 (H)  Thyroglobulin Ab     < or = 1 IU/mL 13 (H)  Previously: 08/12/2015: ATA 14 (<2), TPO 162 ( (<9)  In 05/2019, I advised her to start selenium 200 mcg daily.  Her antithyroid antibodies did not increase significantly while on this in the past.  Pt denies: - feeling nodules in neck - hoarseness - dysphagia - choking - SOB with lying down  No FH of thyroid cancer. + FH of autoimmune ds.: sister and brother. Father with SLE. P cousins with Hashimoto's thyroiditis.  No h/o radiation tx to head or neck. No herbal supplements. No Biotin use. No recent steroids use.   Pt. also has a history of breast cancer.  She is on anastrozole -she completed 5 years  and will complete 2 more.  She walks for exercise. Swimming during the summer.  ROS: + See HPI  Past Medical History:  Diagnosis Date   Anxiety    Asthma    Breast cancer ([place]) 2017   Left Breast Cancer   Breast cancer of lower-outer quadrant of left female breast ([place]) 06/05/8313   Complication of anesthesia    hard time waking up with gallbladder   Depression    Gallbladder problem    GERD (gastroesophageal reflux disease)    Hashimoto's disease    Heartburn    History of external beam radiation therapy 03/10/16-04/23/16   left breast/50.4 Gy in 28 fractions, left breast boost/10 Gy in 5 fractions   Hypothyroidism    Irritable bowel    Joint pain    Personal history of chemotherapy 2017-2018   Left Breast Cancer   Personal history of radiation therapy 2018   Left Breast Cancer   Uterine displacement    Past Surgical History:  Procedure Laterality Date   BACK SURGERY     BREAST BIOPSY Left 09/10/2015   BREAST LUMPECTOMY Left 09/25/2015   BREAST LUMPECTOMY WITH NEEDLE LOCALIZATION AND AXILLARY SENTINEL LYMPH NODE BX Left 09/25/2015   Procedure: BREAST LUMPECTOMY WITH NEEDLE LOCALIZATION X'S 2 AND AXILLARY SENTINEL LYMPH NODE BX;  Surgeon: Erroll Luna, MD;  Location: [facility];  Service: General;  Laterality: Left;   CHOLECYSTECTOMY OPEN     COLONOSCOPY WITH PROPOFOL N/A 04/13/2019   Procedure: COLONOSCOPY WITH PROPOFOL;  Surgeon: Jonathon Bellows, MD;  [facility] ENDOSCOPY;  Service: Gastroenterology;  Laterality: N/A;   disk fusion     ENDOMETRIAL ABLATION     MASTOPEXY Bilateral 10/25/2016   Procedure: BILATERAL MASTOPEXY;  Surgeon: Irene Limbo, MD;  Location: [place];  Service: Plastics;  Laterality: Bilateral;   neck fusion     PORT-A-CATH REMOVAL Right 10/25/2016   Procedure: RIGHT CHEST PORT REMOVAL;  Surgeon: Irene Limbo, MD;  Location: [place];  Service: Plastics;  Laterality: Right;   PORTACATH  PLACEMENT Right 09/25/2015   Procedure: INSERTION PORT-A-CATH;  Surgeon: Erroll Luna, MD;  Location: [place];  Service: General;  Laterality: Right;   REDUCTION MAMMAPLASTY Bilateral 2018   Social History   Socioeconomic History   Marital status: Married    Spouse name: Not on file   Number of children: 2   Years of education: Not on file   Highest education level: Not on file  Occupational History   Occupation: n/a  Tobacco Use   Smoking status: Former    Years: 15.00    Types: Cigarettes    Quit date: 1986  Years since quitting: 36.8   Smokeless tobacco: Never  Vaping Use   Vaping Use: Never used  Substance and Sexual Activity   Alcohol use: Yes    Alcohol/week: 2.0 - 3.0 standard drinks    Types: 2 - 3 Standard drinks or equivalent per week    Comment: social   Drug use: No   Sexual activity: Yes    Partners: Male  Other Topics Concern   Not on file  Social History Narrative   Not on file   Social Determinants of Health   Financial Resource Strain: Not on file  Food Insecurity: Not on file  Transportation Needs: Not on file  Physical Activity: Not on file  Stress: Not on file  Social Connections: Not on file  Intimate Partner Violence: Not on file   Current Outpatient Medications on File Prior to Visit  Medication Sig Dispense Refill   anastrozole (ARIMIDEX) 1 MG tablet TAKE 1 TABLET BY MOUTH EVERY DAY 90 tablet 3   DULoxetine (CYMBALTA) 30 MG capsule Take 1 capsule (30 mg total) by mouth daily. 30 capsule 1   hydrochlorothiazide (HYDRODIURIL) 12.5 MG tablet Take 1 tablet (12.5 mg total) by mouth daily. 30 tablet 1   levothyroxine (SYNTHROID) 100 MCG tablet Take 1 tablet (100 mcg total) by mouth daily. 45 tablet 3   lisinopril (ZESTRIL) 5 MG tablet Take 1 tablet (5 mg total) by mouth daily. 30 tablet 1   No current facility-administered medications on file prior to visit.   Allergies  Allergen Reactions   Latex Rash   Sulfa  Antibiotics Rash   Family History  Problem Relation Age of Onset   Diabetes Mother    Hypertension Mother    Hyperlipidemia Mother    Kidney disease Mother    Obesity Mother    Cancer Maternal Grandfather    Obesity Father    Thyroid disease Other   Also, please see HPI. HTN in oldest brother.  PE: BP 132/82 (BP Location: Right Arm, Patient Position: Sitting, Cuff Size: Normal)   Pulse 82   Ht 5\' 2"  (1.575 m)   Wt 173 lb 6.4 oz (78.7 kg)   SpO2 99%   BMI 31.72 kg/m  Wt Readings from Last 3 Encounters:  11/18/20 173 lb 6.4 oz (78.7 kg)  11/05/20 172 lb (78 kg)  08/29/20 170 lb (77.1 kg)   Constitutional: overweight, in NAD Eyes: PERRLA, EOMI, no exophthalmos ENT: moist mucous membranes, no thyromegaly, no cervical lymphadenopathy Cardiovascular: RRR, No MRG Respiratory: CTA B Gastrointestinal: abdomen soft, NT, ND, BS+ Musculoskeletal: no deformities, strength intact in all 4 Skin: moist, warm, no rashes Neurological: no tremor with outstretched hands, DTR normal in all 4  ASSESSMENT: 1.  Hypothyroidism due to Hashimoto's thyroiditis  PLAN: 1.  Patient with history of Hashimoto's thyroiditis with subsequent hypothyroidism, on replacement with only liothyronine at our first visit.  She was on this for a long time, initially on 50 mcg daily (equivalent LT4 dose of 200 mcg daily!)  and reduced afterwards and finally stopped in 05/2020.  At that time, we discussed about side effects of using only liothyronine, including sleeping problems, hot flashes, stimulatory effects on the [place] nervous system, and also long-term risk of osteoporosis especially if the TSH is suppressed.  After stopping LT3, we started 112 mcg LT4. - at last visit, however, she complained about feeling much worse and it was unclear whether stopping LT3 may have contributed.  I did recommend to add back the multivitamin and  selenium and we discussed about possibly adding a low-dose LT3  -she started to  feel better afterwards so we did not add this -Reviewing the labs at last visit, however, TSH was close to the lower limit of normal so we ended up reducing the dose of LT4: Lab Results  Component Value Date   TSH 0.39 08/29/2020  - she is now on LT4 100 mcg daily and Selenium 200 mcg daily - pt feels much better on this dose. - we discussed about taking the thyroid hormone every day, with water, >30 minutes before breakfast, separated by >4 hours from acid reflux medications, calcium, iron, multivitamins. Pt. is taking it correctly. - will check thyroid tests today: TSH and fT4 - If labs are abnormal, she will need to return for repeat TFTs in 1.5 months - OTW, I will see her back in 6 months.  Needs refills.  Component     Latest Ref Rng & Units 11/18/2020  TSH     0.35 - 5.50 uIU/mL 2.22  T4,Free(Direct)     0.60 - 1.60 ng/dL 1.12  Thyroid tests are normal.  Philemon Kingdom, MD PhD [facility] Endocrinology

## 2020-11-18 NOTE — Patient Instructions (Signed)
Please continue Levothyroxine 100 mcg daily.  Take the thyroid hormone every day, with water, at least 30 minutes before breakfast, separated by at least 4 hours from: - acid reflux medications - calcium - iron - multivitamins  Please stop at the lab.  Please come back for a follow-up appointment in 6 months.

## 2020-11-25 ENCOUNTER — Ambulatory Visit
Admission: RE | Admit: 2020-11-25 | Discharge: 2020-11-25 | Disposition: A | Discharge status: Home / Self Care | Payer: BC Managed Care – PPO | Source: Ambulatory Visit | Attending: Hematology and Oncology | Admitting: Hematology and Oncology

## 2020-11-25 DIAGNOSIS — Z853 Personal history of malignant neoplasm of breast: Secondary | ICD-10-CM

## 2020-11-25 DIAGNOSIS — R922 Inconclusive mammogram: Secondary | ICD-10-CM | POA: Diagnosis not present

## 2020-11-29 ENCOUNTER — Other Ambulatory Visit: Payer: Self-pay | Admitting: Physician Assistant

## 2020-11-29 DIAGNOSIS — F32A Depression, unspecified: Secondary | ICD-10-CM

## 2020-11-29 DIAGNOSIS — F419 Anxiety disorder, unspecified: Secondary | ICD-10-CM

## 2020-11-29 DIAGNOSIS — R030 Elevated blood-pressure reading, without diagnosis of hypertension: Secondary | ICD-10-CM

## 2020-12-05 ENCOUNTER — Other Ambulatory Visit: Payer: Self-pay | Admitting: Physician Assistant

## 2020-12-09 ENCOUNTER — Telehealth (INDEPENDENT_AMBULATORY_CARE_PROVIDER_SITE_OTHER): Payer: BC Managed Care – PPO | Admitting: Physician Assistant

## 2020-12-09 VITALS — BP 149/97 | HR 81 | Ht 62.0 in | Wt 170.0 lb

## 2020-12-09 DIAGNOSIS — F419 Anxiety disorder, unspecified: Secondary | ICD-10-CM | POA: Diagnosis not present

## 2020-12-09 DIAGNOSIS — F32A Depression, unspecified: Secondary | ICD-10-CM | POA: Diagnosis not present

## 2020-12-09 DIAGNOSIS — E782 Mixed hyperlipidemia: Secondary | ICD-10-CM

## 2020-12-09 DIAGNOSIS — R030 Elevated blood-pressure reading, without diagnosis of hypertension: Secondary | ICD-10-CM

## 2020-12-09 DIAGNOSIS — I10 Essential (primary) hypertension: Secondary | ICD-10-CM | POA: Diagnosis not present

## 2020-12-09 MED ORDER — DULOXETINE HCL 30 MG PO CPEP
30.0000 mg | ORAL_CAPSULE | Freq: Every day | ORAL | 1 refills | Status: DC
Start: 2020-12-09 — End: 2021-03-17

## 2020-12-09 MED ORDER — LISINOPRIL 5 MG PO TABS: 5.0000 mg | ORAL_TABLET | Freq: Every day | ORAL | 1 refills | Status: DC

## 2020-12-09 MED ORDER — HYDROCHLOROTHIAZIDE 12.5 MG PO TABS
12.5000 mg | ORAL_TABLET | Freq: Every day | ORAL | 1 refills | Status: DC
Start: 2020-12-09 — End: 2021-03-17

## 2020-12-09 NOTE — Progress Notes (Signed)
..  Virtual Visit via Telephone Note  I connected with Jenna [name] on 12/09/20 at 11:10 AM EST by telephone and verified that I am speaking with the correct person using two identifiers.  Location: Patient: home Provider: clinic  .Marland KitchenParticipating in visit:  Patient: Jenna [name] Provider: Iran Planas PA-C Provider in training: Haroldine Laws PA-S   I discussed the limitations, risks, security and privacy concerns of performing an evaluation and management service by telephone and the availability of in person appointments. I also discussed with the patient that there may be a patient responsible charge related to this service. The patient expressed understanding and agreed to proceed.   History of Present Illness: Pt is a 60 yo female who presents to the clinic to discuss lab work and follow up on blood pressure.   Lipid Panel     Component Value Date/Time   CHOL 251 (H) 11/05/2020 1440   CHOL 294 (H) 03/24/2020 0903   TRIG 343 (H) 11/05/2020 1440   HDL 66 11/05/2020 1440   HDL 80 03/24/2020 0903   CHOLHDL 3.8 11/05/2020 1440   VLDL 35 (H) 08/12/2015 0959   LDLCALC 136 (H) 11/05/2020 1440   LABVLDL 26 03/24/2020 0903    Potassium 5.5.   Checking BP at home and most days under 130s over 80s. No CP, palpitations, headaches or vision changes.     Observations/Objective: No acute distress Normal mood and appearance   Assessment and Plan:  Marland KitchenMarland KitchenDiagnoses and all orders for this visit:  Primary hypertension -     hydrochlorothiazide (HYDRODIURIL) 12.5 MG tablet; Take 1 tablet (12.5 mg total) by mouth daily. -     lisinopril (ZESTRIL) 5 MG tablet; Take 1 tablet (5 mg total) by mouth daily.  Mixed hyperlipidemia  Anxiety and depression -     DULoxetine (CYMBALTA) 30 MG capsule; Take 1 capsule (30 mg total) by mouth daily.  BP not to goal but patient reports they usually are. Stay on lisinopril/HCTZ. Goal reading under 140/90.   10 year CV risk is 3.6 percent. Pt opted  to make diet changes and not start medication.   TG elevated discussed diet to improve TG. Add fish oil to supplements.   Pt preferred to stay on cymbalta for now.     Follow Up Instructions:    I discussed the assessment and treatment plan with the patient. The patient was provided an opportunity to ask questions and all were answered. The patient agreed with the plan and demonstrated an understanding of the instructions.   The patient was advised to call back or seek an in-person evaluation if the symptoms worsen or if the condition fails to improve as anticipated.  I provided 20 minutes of non-face-to-face time during this encounter.   Iran Planas, PA-C    T

## 2021-03-03 ENCOUNTER — Ambulatory Visit: Payer: BC Managed Care – PPO | Admitting: Internal Medicine

## 2021-03-06 ENCOUNTER — Encounter: Payer: Self-pay | Admitting: Internal Medicine

## 2021-03-17 ENCOUNTER — Encounter: Payer: Self-pay | Admitting: Physician Assistant

## 2021-03-17 ENCOUNTER — Other Ambulatory Visit: Payer: Self-pay

## 2021-03-17 ENCOUNTER — Ambulatory Visit (INDEPENDENT_AMBULATORY_CARE_PROVIDER_SITE_OTHER): Payer: BC Managed Care – PPO | Admitting: Physician Assistant

## 2021-03-17 VITALS — BP 139/78 | HR 83 | Ht 62.0 in | Wt 177.0 lb

## 2021-03-17 DIAGNOSIS — I10 Essential (primary) hypertension: Secondary | ICD-10-CM

## 2021-03-17 DIAGNOSIS — Z6832 Body mass index (BMI) 32.0-32.9, adult: Secondary | ICD-10-CM

## 2021-03-17 DIAGNOSIS — E6609 Other obesity due to excess calories: Secondary | ICD-10-CM | POA: Diagnosis not present

## 2021-03-17 DIAGNOSIS — F419 Anxiety disorder, unspecified: Secondary | ICD-10-CM

## 2021-03-17 DIAGNOSIS — F32A Depression, unspecified: Secondary | ICD-10-CM

## 2021-03-17 MED ORDER — DULOXETINE HCL 30 MG PO CPEP: 30.0000 mg | ORAL_CAPSULE | Freq: Every day | ORAL | 1 refills | Status: DC

## 2021-03-17 MED ORDER — HYDROCHLOROTHIAZIDE 12.5 MG PO TABS: 12.5000 mg | ORAL_TABLET | Freq: Every day | ORAL | 1 refills | Status: DC

## 2021-03-17 MED ORDER — WEGOVY 0.25 MG/0.5ML ~~LOC~~ SOAJ: 0.2500 mg | SUBCUTANEOUS | 0 refills | Status: DC

## 2021-03-17 MED ORDER — WEGOVY 1 MG/0.5ML ~~LOC~~ SOAJ: 1.0000 mg | SUBCUTANEOUS | 0 refills | Status: DC

## 2021-03-17 MED ORDER — LISINOPRIL 5 MG PO TABS: 5.0000 mg | ORAL_TABLET | Freq: Every day | ORAL | 1 refills | Status: DC

## 2021-03-17 MED ORDER — WEGOVY 0.5 MG/0.5ML ~~LOC~~ SOAJ: 0.5000 mg | SUBCUTANEOUS | 0 refills | Status: DC

## 2021-03-17 NOTE — Progress Notes (Signed)
? ?Subjective:  ? ? Patient ID: Jenna [name], female    DOB: 07/23/1960, 61 y.o.   MRN: [id number] ? ?HPI ?Pt is a 61 yo obese female with HTN, anxiety, depression, hypothyroidism who presents to the clinic for follow up.  ? ?Mood is doing ok. She likes how she feels on the lower dose of cymbalta. No SI/HC.  ? ?She is checking BP at  home and ranging 130s over 70s. No CP, palpitations, headaches, or dizziness. She tolerates the medication well.  ? ?She is wanting to lose weight. She is exercising 3-4 times a week and trying to keep to low fat low carb diet. She is interested in wegovy for weight loss.  ? ?... ?Active Ambulatory Problems  ?  Diagnosis Date Noted  ? Hashimoto's thyroiditis 02/25/2014  ? GERD (gastroesophageal reflux disease) 02/25/2014  ? Allergic rhinitis 02/25/2014  ? Uterine fibroid 02/25/2014  ? Mixed hyperlipidemia 02/25/2014  ? Multiple joint pain 02/25/2014  ? Postmenopausal 10/29/2014  ? Insomnia 12/11/2014  ? Irritability and anger 12/11/2014  ? Anxiety and depression 12/11/2014  ? Hypertriglyceridemia 08/13/2015  ? Constipation 08/15/2015  ? Family history of systemic lupus erythematosus 08/15/2015  ? Bloating 08/15/2015  ? Multiple food allergies 08/19/2015  ? Left breast mass 09/02/2015  ? Breast cancer of lower-outer quadrant of left female breast ([place]) 09/12/2015  ? Malignant neoplasm of lower-outer quadrant of left breast of female, estrogen receptor positive ([place]) 10/13/2015  ? Antineoplastic chemotherapy induced anemia 12/12/2015  ? Chemotherapy-induced thrombocytopenia 01/23/2016  ? Current moderate episode of major depressive disorder without prior episode ([place]) 07/16/2016  ? Hyperkalemia 07/16/2016  ? Breast tenderness 07/16/2016  ? Port catheter in place 07/30/2016  ? Arthralgia of left temporomandibular joint 06/13/2017  ? De Quervain's tenosynovitis, right 04/24/2018  ? Exposure to COVID-19 virus 05/24/2018  ? Left ear pain 12/12/2018  ? Hot flashes 12/12/2018  ? Panic  attacks 12/12/2018  ? History of breast cancer 09/21/2016  ? History of therapeutic radiation 09/21/2016  ? Osteopenia 04/09/2019  ? Class 1 obesity due to excess calories with serious comorbidity and body mass index (BMI) of 32.0 to 32.9 in adult 03/17/2021  ? Primary hypertension 03/17/2021  ? ?Resolved Ambulatory Problems  ?  Diagnosis Date Noted  ? No Resolved Ambulatory Problems  ? ?Past Medical History:  ?Diagnosis Date  ? Anxiety   ? Asthma   ? Breast cancer ([place]) 2017  ? Complication of anesthesia   ? Depression   ? Gallbladder problem   ? Hashimoto's disease   ? Heartburn   ? History of external beam radiation therapy 03/10/16-04/23/16  ? Hypothyroidism   ? Irritable bowel   ? Joint pain   ? Personal history of chemotherapy 2017-2018  ? Personal history of radiation therapy 2018  ? Uterine displacement   ? ? ? ?Review of Systems  ?All other systems reviewed and are negative. ? ?   ?Objective:  ? Physical Exam ?Vitals reviewed.  ?Constitutional:   ?   Appearance: Normal appearance. She is obese.  ?HENT:  ?   Head: Normocephalic.  ?Cardiovascular:  ?   Rate and Rhythm: Normal rate and regular rhythm.  ?   Pulses: Normal pulses.  ?   Heart sounds: Normal heart sounds.  ?Pulmonary:  ?   Effort: Pulmonary effort is normal.  ?Neurological:  ?   General: No focal deficit present.  ?   Mental Status: She is alert and oriented to person, place, and time.  ?Psychiatric:     ?  Mood and Affect: Mood normal.  ? ?.. ?Depression screen [facility] 2/9 03/17/2021 11/05/2020 04/18/2019 12/12/2018 06/10/2017  ?Decreased Interest '2 3 3 2 1  '$ ?Down, Depressed, Hopeless '1 2 3 3 2  '$ ?PHQ - 2 Score '3 5 6 5 3  '$ ?Altered sleeping '3 1 2 3 3  '$ ?Tired, decreased energy '2 2 2 3 1  '$ ?Change in appetite '1 2 1 2 '$ 0  ?Feeling bad or failure about yourself  1 2 0 2 2  ?Trouble concentrating 1 2 0 3 1  ?Moving slowly or fidgety/restless 1 1 0 2 1  ?Suicidal thoughts 0 0 0 0 0  ?PHQ-9 Score '12 15 11 20 11  '$ ?Difficult doing work/chores Somewhat difficult  Somewhat difficult Not difficult at all Somewhat difficult -  ?Some recent data might be hidden  ? ?.. ?GAD 7 : Generalized Anxiety Score 03/17/2021 11/05/2020 12/12/2018 06/10/2017  ?Nervous, Anxious, on Edge '1 1 3 1  '$ ?Control/stop worrying '2 2 3 1  '$ ?Worry too much - different things '2 2 3 1  '$ ?Trouble relaxing '1 1 3 2  '$ ?Restless 0 0 1 1  ?Easily annoyed or irritable '1 1 3 2  '$ ?Afraid - awful might happen '1 1 2 2  '$ ?Total GAD 7 Score '8 8 18 10  '$ ?Anxiety Difficulty Somewhat difficult Somewhat difficult Not difficult at all Somewhat difficult  ? ? ? ? ? ? ?   ?Assessment & Plan:  ?..Isabelly was seen today for hypertension and follow-up. ? ?Diagnoses and all orders for this visit: ? ?Class 1 obesity due to excess calories with serious comorbidity and body mass index (BMI) of 32.0 to 32.9 in adult ?-     WEGOVY 0.25 MG/0.5ML SOAJ; Inject 0.25 mg into the skin once a week. Use this dose for 1 month (4 shots) and then increase to next higher dose. ?-     WEGOVY 0.5 MG/0.5ML SOAJ; Inject 0.5 mg into the skin once a week. Use this dose for 1 month (4 shots) and then increase to next higher dose. ?-     WEGOVY 1 MG/0.5ML SOAJ; Inject 1 mg into the skin once a week. Use this dose for 1 month (4 shots) and then increase to next higher dose. ? ?Anxiety and depression ?-     DULoxetine (CYMBALTA) 30 MG capsule; Take 1 capsule (30 mg total) by mouth daily. ? ?Primary hypertension ?-     lisinopril (ZESTRIL) 5 MG tablet; Take 1 tablet (5 mg total) by mouth daily. ?-     hydrochlorothiazide (HYDRODIURIL) 12.5 MG tablet; Take 1 tablet (12.5 mg total) by mouth daily. ? ? ?BP borderline. Keep meds the same for now and work on weight loss.  ?Follow up in 3 months.  ? ?..Discussed low carb diet with 1500 calories and 80g of protein.  ?Exercising at least 150 minutes a week.  ?My Fitness Pal could be a Microbiologist.  ?Sent wegovy. Discussed side effects and taper.  ?Follow up in 3 months.  ? ?PHQ/GAD stable.  ?Refilled cymbalta.  ? ?

## 2021-03-19 ENCOUNTER — Telehealth: Payer: Self-pay

## 2021-03-19 NOTE — Telephone Encounter (Addendum)
?  WEGOVY 0.25 MG/0.5ML SOAJ ?WEGOVY 0.5 MG/0.5ML SOAJ  WEGOVY 1 MG/0.5ML SOAJ  Initiated Prior authorization for: ?Via: Covermymeds ?Case/Key: [id number] ?Status: denied as of 03/19/21 ?Reason:You do not meet the requirements of your plan. The information we received indicated that this request ?for coverage is to continue therapy on this medication. Current plan approved criteria covers this drug ?when you have been taking it for at least three months at a stable maintenance dose and met any of ?these conditions: ?- You are at least 8 years of age or older ?- You lost at least 5 percent of your body weight ?- You have continued to keep your initial 5 percent weight loss off ?Supporting documentation must be submitted. Your request has been denied based on the information ?we have. ?Notified Pt via: Mychart ?

## 2021-03-24 ENCOUNTER — Encounter: Payer: Self-pay | Admitting: Physician Assistant

## 2021-04-01 ENCOUNTER — Telehealth: Payer: Self-pay

## 2021-04-01 NOTE — Telephone Encounter (Addendum)
resubmission Prior authorization BVA:POLIDC ?Via: Covermymeds ?Case/Key:[id number] ?Status: approved as of 04/01/21 ?Reason:this request is approved from 04/01/2021 to 11/01/2021. ?Notified Pt via: Mychart ?

## 2021-04-02 ENCOUNTER — Other Ambulatory Visit (INDEPENDENT_AMBULATORY_CARE_PROVIDER_SITE_OTHER): Payer: Self-pay | Admitting: Family Medicine

## 2021-04-02 ENCOUNTER — Other Ambulatory Visit: Payer: Self-pay | Admitting: Hematology and Oncology

## 2021-04-02 DIAGNOSIS — F419 Anxiety disorder, unspecified: Secondary | ICD-10-CM

## 2021-04-24 NOTE — Progress Notes (Signed)
? ?Patient Care Team: ?Lavada Mesi as PCP - General (Family Medicine) ?Erroll Luna, MD as Consulting Physician (General Surgery) ?Nicholas Lose, MD as Consulting Physician (Hematology and Oncology) ?Gery Pray, MD as Consulting Physician (Radiation Oncology) ?Gardenia Phlegm, NP as Nurse Practitioner (Hematology and Oncology) ? ?DIAGNOSIS:  ?Encounter Diagnosis  ?Name Primary?  ? Malignant neoplasm of lower-outer quadrant of left breast of female, estrogen receptor positive ([place])   ? ? ?SUMMARY OF ONCOLOGIC HISTORY: ?Oncology History  ?Breast cancer of lower-outer quadrant of left female breast ([place])  ?09/10/2015 Initial Diagnosis  ? Screening detected left breast asymmetry: 2 adjacent masses 4:00: 8 mm and 3:30: 9 mm; axilla negative, biopsy grade 2-3 IDC, ER 70%, PR 5%, Ki-67 10%, HER-2 positive ratio 3.41, T1BN0 stage IA ? ?  ?09/25/2015 Surgery  ? Left lumpectomy: IDC with DCIS, 1.2 cm, margins negative, 0/4 lymph nodes negative, grade 3, ER 70%, PR 5%, HER-2 positive ratio 3.45, Ki-67 10%, T1 CN 0 stage IA ? ?  ?10/31/2015 - 02/13/2016 Chemotherapy  ? Adjuvant chemotherapy with TCH ?6 cycles followed by Herceptin maintenance for 1 year ? ?  ?03/10/2016 - 04/23/2016 Radiation Therapy  ? Adjuvant radiation therapy ? ?  ?05/28/2016 -  Anti-estrogen oral therapy  ? Anastrozole 1 mg daily, changed to letrozole after 2 months due to poor tolerance, switched to anastrozole due to right forearm pain from tendinitis ? ?  ? ? ?CHIEF COMPLIANT: Follow-up of left breast cancer on anastrozole therapy ? ?INTERVAL HISTORY: Jenna [name] is a 61 y.o. with above-mentioned history of  left breast cancer treated with lumpectomy, adjuvant chemotherapy, radiation, and anti-estrogen therapy with anastrozole. She presents to the clinic today for annual follow-up.  She is tolerating anastrozole extremely well without any problems or concerns.  Denies any lumps or nodules in the breast.  She does feel  intermittently sore. ? ? ?ALLERGIES:  is allergic to latex and sulfa antibiotics. ? ?MEDICATIONS:  ?Current Outpatient Medications  ?Medication Sig Dispense Refill  ? anastrozole (ARIMIDEX) 1 MG tablet TAKE 1 TABLET BY MOUTH EVERY DAY 90 tablet 3  ? DULoxetine (CYMBALTA) 30 MG capsule Take 1 capsule (30 mg total) by mouth daily. 90 capsule 1  ? hydrochlorothiazide (HYDRODIURIL) 12.5 MG tablet Take 1 tablet (12.5 mg total) by mouth daily. 90 tablet 1  ? levothyroxine (SYNTHROID) 100 MCG tablet Take 1 tablet (100 mcg total) by mouth daily. 90 tablet 3  ? lisinopril (ZESTRIL) 5 MG tablet Take 1 tablet (5 mg total) by mouth daily. 90 tablet 1  ? WEGOVY 0.25 MG/0.5ML SOAJ Inject 0.25 mg into the skin once a week. Use this dose for 1 month (4 shots) and then increase to next higher dose. 2 mL 0  ? WEGOVY 0.5 MG/0.5ML SOAJ Inject 0.5 mg into the skin once a week. Use this dose for 1 month (4 shots) and then increase to next higher dose. 2 mL 0  ? [START ON 05/17/2021] WEGOVY 1 MG/0.5ML SOAJ Inject 1 mg into the skin once a week. Use this dose for 1 month (4 shots) and then increase to next higher dose. 2 mL 0  ? ?No current facility-administered medications for this visit.  ? ? ?PHYSICAL EXAMINATION: ?ECOG PERFORMANCE STATUS: 1 - Symptomatic but completely ambulatory ? ?Vitals:  ? 05/08/21 1110  ?BP: 131/88  ?Pulse: 91  ?Resp: 16  ?Temp: 98.2 ?F (36.8 ?C)  ?SpO2: 99%  ? ?Filed Weights  ? 05/08/21 1110  ?Weight: 171 lb 6.4 oz (  77.7 kg)  ? ? ?BREAST: No palpable masses or nodules in either right or left breasts. No palpable axillary supraclavicular or infraclavicular adenopathy no breast tenderness or nipple discharge. (exam performed in the presence of a chaperone) ? ?LABORATORY DATA:  ?I have reviewed the data as listed ? ?  Latest Ref Rng & Units 11/05/2020  ?  2:40 PM 10/30/2019  ?  9:56 AM 04/18/2019  ? 10:58 AM  ?CMP  ?Glucose 65 - 139 mg/dL 82   87   87    ?BUN 7 - 25 mg/dL $Remove'18   15   14    'HYhJjPQ$ ?Creatinine 0.50 - 1.03 mg/dL  0.72   0.64   0.63    ?Sodium 135 - 146 mmol/L 141   145   143    ?Potassium 3.5 - 5.3 mmol/L 5.5   5.1   4.4    ?Chloride 98 - 110 mmol/L 103   104   103    ?CO2 20 - 32 mmol/L $RemoveB'29   27   24    'YVWeqGMc$ ?Calcium 8.6 - 10.4 mg/dL 9.9   9.9   10.2    ?Total Protein 6.1 - 8.1 g/dL 7.5   7.2   7.1    ?Total Bilirubin 0.2 - 1.2 mg/dL 0.3   0.4   0.3    ?Alkaline Phos 44 - 121 IU/L  84   86    ?AST 10 - 35 U/L $Remo'24   23   18    'FAgBU$ ?ALT 6 - 29 U/L $Remo'27   25   18    'mtiUs$ ? ? ?Lab Results  ?Component Value Date  ? WBC 6.2 04/18/2019  ? HGB 14.5 04/18/2019  ? HCT 44.5 04/18/2019  ? MCV 91 04/18/2019  ? PLT 284 04/18/2019  ? NEUTROABS 4.0 04/18/2019  ? ? ?ASSESSMENT & PLAN:  ?Breast cancer of lower-outer quadrant of left female breast ([place]) ?Left lumpectomy 09/25/2015: IDC with DCIS, 1.2 cm, margins negative, 0/4 lymph nodes negative, grade 3, ER 70%, PR 5%, HER-2 positive ratio 3.45, Ki-67 10%, T1 CN 0 stage IA ?  ?Treatment plan: ?1. Adjuvant chemotherapy with TCH ?6 cycles 10/31/2015- 02/13/2016 followed by Herceptin every 3 weeks for 1 year completed 07/30/2016 ?2. Followed by adjuvant radiation 03/10/2016-  04/23/2016  ?3. Followed by adjuvant antiestrogen therapy started 05/28/2016 ?---------------------------------------------------------------------------------------------------------------------------------- ?Anastrozole toxicities : ?Does not have any myalgias ?Plan to treat her for 7 years. ?  ?Survivorship: She is walking for half miles every day along with lots of other physical activities.   She was able to keep the weight off. ?  ?CT CAP 02/07/2017: No evidence of metastatic disease, 4 mm subpleural left lower lobe nodule nonspecific ?Bone scan 02/07/2017: No findings of metastatic disease ?  ?Breast cancer surveillance: ?11/25/2020: Mammogram: Benign.  ?05/08/2021: Breast exam: Benign ?  ?Return to clinic in 1 year for follow-up ? ? ? ?No orders of the defined types were placed in this encounter. ? ?The patient has a good understanding of  the overall plan. she agrees with it. she will call with any problems that may develop before the next visit here. ?Total time spent: 30 mins including face to face time and time spent for planning, charting and co-ordination of care ? ? Harriette Ohara, MD ?05/08/21 ? ? ? I Gardiner Coins am scribing for Dr. Lindi Adie ? ?I have reviewed the above documentation for accuracy and completeness, and I agree with the above. ?  ?

## 2021-05-08 ENCOUNTER — Other Ambulatory Visit: Payer: Self-pay

## 2021-05-08 ENCOUNTER — Inpatient Hospital Stay: Payer: BC Managed Care – PPO | Attending: Hematology and Oncology | Admitting: Hematology and Oncology

## 2021-05-08 DIAGNOSIS — Z170 Estrogen receptor positive status [ER+]: Secondary | ICD-10-CM

## 2021-05-08 DIAGNOSIS — Z79811 Long term (current) use of aromatase inhibitors: Secondary | ICD-10-CM | POA: Diagnosis not present

## 2021-05-08 DIAGNOSIS — Z923 Personal history of irradiation: Secondary | ICD-10-CM | POA: Diagnosis not present

## 2021-05-08 DIAGNOSIS — C50512 Malignant neoplasm of lower-outer quadrant of left female breast: Secondary | ICD-10-CM | POA: Diagnosis not present

## 2021-05-08 DIAGNOSIS — Z79899 Other long term (current) drug therapy: Secondary | ICD-10-CM | POA: Diagnosis not present

## 2021-05-08 NOTE — Assessment & Plan Note (Signed)
Left lumpectomy 09/25/2015: IDC with DCIS, 1.2 cm, margins negative, 0/4 lymph nodes negative, grade 3, ER 70%, PR 5%, HER-2 positive ratio 3.45, Ki-67 10%, T1 CN 0 stage IA ?? ?Treatment plan: ?1. Adjuvant chemotherapy with TCH ?6 cycles 10/31/2015- 02/13/2016?followed by Herceptin every 3 weeks for 1 year?completed 07/30/2016 ?2. Followed by adjuvant radiation 03/10/2016- ?04/23/2016? ?3. Followed by adjuvant antiestrogen therapy?started 05/28/2016 ?---------------------------------------------------------------------------------------------------------------------------------- ?Anastrozole toxicities?: ?Does not have any myalgias ?Plan to treat her for 7 years. ?? ?Survivorship:?She is walking for half miles every day along with lots of other physical activities. ? She was able to keep the weight off. ?? ?CT CAP 02/07/2017: No evidence of metastatic disease, 4 mm subpleural left lower lobe nodule nonspecific ?Bone scan 02/07/2017: No findings of metastatic disease ?? ?Breast cancer surveillance: ?11/25/2020: Mammogram:?Benign.  ?05/08/2021: Breast exam: Benign ?? ?Return to clinic in?1 year?for follow-up ?

## 2021-05-11 ENCOUNTER — Telehealth: Payer: Self-pay | Admitting: Hematology and Oncology

## 2021-05-11 NOTE — Telephone Encounter (Signed)
Per 5/5 los called and left pt a message appointment for next year call back number left if changes are needed ?

## 2021-05-22 ENCOUNTER — Encounter: Payer: Self-pay | Admitting: Physician Assistant

## 2021-05-22 MED ORDER — WEGOVY 1.7 MG/0.75ML ~~LOC~~ SOAJ: 1.7000 mg | SUBCUTANEOUS | 0 refills | Status: DC

## 2021-06-02 ENCOUNTER — Encounter: Payer: Self-pay | Admitting: Internal Medicine

## 2021-06-02 ENCOUNTER — Ambulatory Visit: Payer: BC Managed Care – PPO | Admitting: Internal Medicine

## 2021-06-02 VITALS — BP 118/82 | HR 79 | Ht 62.0 in | Wt 163.4 lb

## 2021-06-02 DIAGNOSIS — E063 Autoimmune thyroiditis: Secondary | ICD-10-CM | POA: Diagnosis not present

## 2021-06-02 DIAGNOSIS — E038 Other specified hypothyroidism: Secondary | ICD-10-CM | POA: Diagnosis not present

## 2021-06-02 LAB — T4, FREE: Free T4: 1.46 ng/dL (ref 0.60–1.60)

## 2021-06-02 LAB — TSH: TSH: 0.43 u[IU]/mL (ref 0.35–5.50)

## 2021-06-02 MED ORDER — LEVOTHYROXINE SODIUM 88 MCG PO TABS: 88.0000 ug | ORAL_TABLET | Freq: Every day | ORAL | 5 refills | Status: DC

## 2021-06-02 NOTE — Patient Instructions (Addendum)
Please continue Levothyroxine 100 mcg daily.  Take the thyroid hormone every day, with water, at least 30 minutes before breakfast, separated by at least 4 hours from: - acid reflux medications - calcium - iron - multivitamins  Please stop at the lab.  Please come back for a follow-up appointment in 1 year - but for labs in 6 months.

## 2021-06-02 NOTE — Progress Notes (Signed)
Patient ID: Jenna [name], female   DOB: 05/09/1960, 61 y.o.   MRN: [id number]   HPI  Jenna [name] is a 61 y.o.-year-old female, initially referred by Dr. Adair Patter, returning for follow-up forHashimoto's hypothyroidism.  Last visit 7 months ago.  Interim history: She previously was seen in the weight management clinic, but then stopped.   She is currently on Wegovy - started 3 mo ago.  He is also trying to apply the dietary principles from the weight management clinic.  She started to lose weight.  She lost 14 pounds after she started the medication.  She feels great!  Her cravings for salty foods are gone.  Her goal weight is 125-130 pounds.  Reviewed and addended history: She was diagnosed with Hashimoto's hypothyroidism in 2015-2014 after she described lethargy, inability to lose weight, hair loss.  She was started on Levothyroxine (LT4) initially, but quickly changed to liothyronine (LT3) 50 mcg daily >> then changed to 25 mcg daily in 04/2019 (equivalent of 100 mcg levothyroxine daily).  On this regimen, her TSH was suppressed.  In 05/2019, we changed to: - LT4 50 mcg daily - LT3 12.5 mcg daily For an equivalent of LT4 of 100 mcg daily  In 06/2019, we changed to: - LT4 88 mcg daily - LT3 5 mcg daily For an equivalent of LT4 of 108 mcg daily  In 11/2019, we changed to:  - LT4 100 mcg daily  - LT3 2.5 mcg daily  For an equivalent of 110 mcg daily LT4  In 05/2019, we changed to: - LT4 112 mcg daily  In 08/2020, we decreased the dose to: - LT4 100 mcg daily   She felt much better after we started to decrease her LT3 dose but in 08/2020 she was feeling worse.  We started selenium 200 mcg daily.  She felt better.  She takes LT4: - previously at night (2-3 hours after dinner) >> now in a.m. - with a full glass of water - b'fast after 2-3 hours after - black coffee - no Calcium - + MVI - at night - prev.  On omeprazole twice a day, now off - prev.  On biotin, now  off  I reviewed her TFTs: Lab Results  Component Value Date   TSH 2.22 11/18/2020   TSH 0.39 08/29/2020   TSH 0.79 05/13/2020   TSH 3.22 11/12/2019   TSH 2.36 06/21/2019   TSH 2.15 05/14/2019   TSH 0.018 (L) 04/18/2019   TSH 6.24 (H) 12/08/2018   TSH 0.43 06/10/2017   TSH 7.16 (H) 07/16/2016   FREET4 1.12 11/18/2020   FREET4 1.10 08/29/2020   FREET4 1.09 05/13/2020   FREET4 0.77 11/12/2019   FREET4 0.77 06/21/2019   FREET4 0.21 (L) 05/14/2019   FREET4 0.20 (L) 04/18/2019   FREET4 1.2 08/12/2015    Her thyroid antibodies were elevated: Component     Latest Ref Rng & Units 11/12/2019  Thyroperoxidase Ab SerPl-aCnc     <9 IU/mL 335 (H)  Thyroglobulin Ab     < or = 1 IU/mL 16 (H)   Component     Latest Ref Rng & Units 05/14/2019  Thyroperoxidase Ab SerPl-aCnc     <9 IU/mL 379 (H)  Thyroglobulin Ab     < or = 1 IU/mL 13 (H)  Previously: 08/12/2015: ATA 14 (<2), TPO 162 ( (<9)  In 05/2019, I advised her to start selenium 200 mcg daily.  Her antithyroid antibodies did not increase significantly while on this in  the past.  Pt denies: - feeling nodules in neck - hoarseness - dysphagia - choking - SOB with lying down  No FH of thyroid cancer. + FH of autoimmune ds.: sister and brother. Father with SLE. P cousins with Hashimoto's thyroiditis. No h/o radiation tx to head or neck. No herbal supplements. No Biotin use. No recent steroids use.   Pt. also has a history of breast cancer.  She is on anastrozole. She walks for exercise. Swimming during the summer. She continues to take Cymbalta for chronic anxiety.  ROS: + See HPI  Past Medical History:  Diagnosis Date   Anxiety    Asthma    Breast cancer ([place]) 2017   Left Breast Cancer   Breast cancer of lower-outer quadrant of left female breast ([place]) 04/04/6604   Complication of anesthesia    hard time waking up with gallbladder   Depression    Gallbladder problem    GERD (gastroesophageal reflux disease)     Hashimoto's disease    Heartburn    History of external beam radiation therapy 03/10/16-04/23/16   left breast/50.4 Gy in 28 fractions, left breast boost/10 Gy in 5 fractions   Hypothyroidism    Irritable bowel    Joint pain    Personal history of chemotherapy 2017-2018   Left Breast Cancer   Personal history of radiation therapy 2018   Left Breast Cancer   Uterine displacement    Past Surgical History:  Procedure Laterality Date   BACK SURGERY     BREAST BIOPSY Left 09/10/2015   BREAST LUMPECTOMY Left 09/25/2015   BREAST LUMPECTOMY WITH NEEDLE LOCALIZATION AND AXILLARY SENTINEL LYMPH NODE BX Left 09/25/2015   Procedure: BREAST LUMPECTOMY WITH NEEDLE LOCALIZATION X'S 2 AND AXILLARY SENTINEL LYMPH NODE BX;  Surgeon: Erroll Luna, MD;  Location: [facility];  Service: General;  Laterality: Left;   CHOLECYSTECTOMY OPEN     COLONOSCOPY WITH PROPOFOL N/A 04/13/2019   Procedure: COLONOSCOPY WITH PROPOFOL;  Surgeon: Jonathon Bellows, MD;  Location: [facility] ENDOSCOPY;  Service: Gastroenterology;  Laterality: N/A;   disk fusion     ENDOMETRIAL ABLATION     MASTOPEXY Bilateral 10/25/2016   Procedure: BILATERAL MASTOPEXY;  Surgeon: Irene Limbo, MD;  Location: [place];  Service: Plastics;  Laterality: Bilateral;   neck fusion     PORT-A-CATH REMOVAL Right 10/25/2016   Procedure: RIGHT CHEST PORT REMOVAL;  Surgeon: Irene Limbo, MD;  Location: [place];  Service: Plastics;  Laterality: Right;   PORTACATH PLACEMENT Right 09/25/2015   Procedure: INSERTION PORT-A-CATH;  Surgeon: Erroll Luna, MD;  Location: [place];  Service: General;  Laterality: Right;   REDUCTION MAMMAPLASTY Bilateral 2018   Social History   Socioeconomic History   Marital status: Married    Spouse name: Not on file   Number of children: 2   Years of education: Not on file   Highest education level: Not on file  Occupational History   Occupation: n/a   Tobacco Use   Smoking status: Former    Years: 15.00    Types: Cigarettes    Quit date: 1986    Years since quitting: 37.4   Smokeless tobacco: Never  Vaping Use   Vaping Use: Never used  Substance and Sexual Activity   Alcohol use: Yes    Alcohol/week: 2.0 - 3.0 standard drinks    Types: 2 - 3 Standard drinks or equivalent per week    Comment: social   Drug use: No  Sexual activity: Yes    Partners: Male  Other Topics Concern   Not on file  Social History Narrative   Not on file   Social Determinants of Health   Financial Resource Strain: Not on file  Food Insecurity: Not on file  Transportation Needs: Not on file  Physical Activity: Not on file  Stress: Not on file  Social Connections: Not on file  Intimate Partner Violence: Not on file   Current Outpatient Medications on File Prior to Visit  Medication Sig Dispense Refill   anastrozole (ARIMIDEX) 1 MG tablet TAKE 1 TABLET BY MOUTH EVERY DAY 90 tablet 3   DULoxetine (CYMBALTA) 30 MG capsule Take 1 capsule (30 mg total) by mouth daily. 90 capsule 1   hydrochlorothiazide (HYDRODIURIL) 12.5 MG tablet Take 1 tablet (12.5 mg total) by mouth daily. 90 tablet 1   levothyroxine (SYNTHROID) 100 MCG tablet Take 1 tablet (100 mcg total) by mouth daily. 90 tablet 3   lisinopril (ZESTRIL) 5 MG tablet Take 1 tablet (5 mg total) by mouth daily. 90 tablet 1   Semaglutide-Weight Management (WEGOVY) 1.7 MG/0.75ML SOAJ Inject 1.7 mg into the skin once a week. 3 mL 0   No current facility-administered medications on file prior to visit.   Allergies  Allergen Reactions   Latex Rash   Sulfa Antibiotics Rash   Family History  Problem Relation Age of Onset   Diabetes Mother    Hypertension Mother    Hyperlipidemia Mother    Kidney disease Mother    Obesity Mother    Obesity Father    Cancer Maternal Grandfather    Breast cancer Cousin 76   Breast cancer Cousin    Breast cancer Cousin    Thyroid disease Other   Also, please  see HPI. HTN in oldest brother.  PE: BP 118/82 (BP Location: Left Arm, Patient Position: Sitting, Cuff Size: Normal)   Pulse 79   Ht '5\' 2"'$  (1.575 m)   Wt 163 lb 6.4 oz (74.1 kg)   SpO2 100%   BMI 29.89 kg/m  Wt Readings from Last 3 Encounters:  06/02/21 163 lb 6.4 oz (74.1 kg)  05/08/21 171 lb 6.4 oz (77.7 kg)  03/17/21 177 lb (80.3 kg)   Constitutional: overweight, in NAD Eyes: PERRLA, EOMI, no exophthalmos ENT: moist mucous membranes, no thyromegaly, no cervical lymphadenopathy Cardiovascular: RRR, No MRG Respiratory: CTA B Musculoskeletal: no deformities, strength intact in all 4 Skin: moist, warm, no rashes Neurological: no tremor with outstretched hands, DTR normal in all 4  ASSESSMENT: 1.  Hypothyroidism due to Hashimoto's thyroiditis  PLAN: 1.  Patient with  history of Hashimoto's thyroiditis with subsequent hypothyroidism, on replacement with only liothyronine at our first visit.  She was on this for a long time, initially on 50 mcg daily (equivalent LT4 dose of 200 mcg daily!)  and reduced afterwards and finally stopped in 05/2020.   After stopping LT3, we started 112 mcg LT4.  We then had to reduce the dose of LT4 to 100 mcg daily. -She felt worse after stopping LT 3, but I did recommend to add back a multivitamin and selenium and we discussed about possibly adding a low-dose LT3  -she started to feel better after this, so we did not this. - latest thyroid labs reviewed with pt. >> normal: Lab Results  Component Value Date   TSH 2.22 11/18/2020  - she continues on LT4 100 mcg daily.  She is also on selenium 200 mcg daily - pt  feels good on this dose. She lost a net 10 lbs since last OV - we discussed that this may trigger the need to lower her LT4 dose. - we discussed about taking the thyroid hormone every day, with water, >30 minutes before breakfast, separated by >4 hours from acid reflux medications, calcium, iron, multivitamins. Pt. is taking it correctly. - will  check thyroid tests today: TSH and fT4 - If labs are abnormal, she will need to return for repeat TFTs in 1.5 months - OTW, I will see her back in 1 year, but for repeat labs in 6 months  Component     Latest Ref Rng 06/02/2021  TSH     0.35 - 5.50 uIU/mL 0.43   T4,Free(Direct)     0.60 - 1.60 ng/dL 1.46   TSH is low in the normal range.  I will suggest to decrease the dose of levothyroxine to 88 mcg daily and recheck her test in 1.5 months.  Philemon Kingdom, MD PhD [facility] Endocrinology

## 2021-06-03 ENCOUNTER — Encounter: Payer: Self-pay | Admitting: Physician Assistant

## 2021-06-03 ENCOUNTER — Ambulatory Visit: Payer: BC Managed Care – PPO | Admitting: Physician Assistant

## 2021-06-03 VITALS — BP 140/83 | HR 88 | Ht 62.0 in | Wt 163.0 lb

## 2021-06-03 DIAGNOSIS — E663 Overweight: Secondary | ICD-10-CM | POA: Diagnosis not present

## 2021-06-03 MED ORDER — WEGOVY 2.4 MG/0.75ML ~~LOC~~ SOAJ: 2.4000 mg | SUBCUTANEOUS | 1 refills | Status: DC

## 2021-06-03 NOTE — Progress Notes (Signed)
   Established Patient Office Visit  Subjective   Patient ID: Jenna [name], female    DOB: 1960/07/03  Age: 61 y.o. MRN: [id number]  Chief Complaint  Patient presents with   Follow-up    HPI Pt is a 61 year old female who presents to the clinic to follow-up on weight.  She is started [facility] and at the 1.7 mg.  She is doing extremely well.  She has very little side effects.  She has lost 14 pounds.  She is ready to go up to the higher dose.  Her goal weight is 130.  She is exercising and watching her portion control.  She is very excited that she has had to go down on her thyroid medication with endocrinology.   Review of Systems  All other systems reviewed and are negative.    Objective:     BP 140/83   Pulse 88   Ht '5\' 2"'$  (1.575 m)   Wt 163 lb (73.9 kg)   SpO2 97%   BMI 29.81 kg/m    Physical Exam Vitals reviewed.  Constitutional:      Appearance: Normal appearance.  HENT:     Head: Normocephalic.  Cardiovascular:     Rate and Rhythm: Normal rate.     Pulses: Normal pulses.     Heart sounds: Normal heart sounds.  Pulmonary:     Effort: Pulmonary effort is normal.  Neurological:     General: No focal deficit present.     Mental Status: She is alert.  Psychiatric:        Mood and Affect: Mood normal.       Assessment & Plan:  Marland KitchenMarland KitchenCarolann was seen today for follow-up.  Diagnoses and all orders for this visit:  Overweight (BMI 25.0-29.9) -     WEGOVY 2.4 MG/0.75ML SOAJ; Inject 2.4 mg into the skin once a week.   Increased wegovy to 2.'4mg'$  weekly No current side effects consider side effects with increase Continue diet and exercise Goal weight of 130-145 Follow up in 6 months   Return in about 6 months (around 12/03/2021).    Iran Planas, PA-C

## 2021-07-15 ENCOUNTER — Other Ambulatory Visit (INDEPENDENT_AMBULATORY_CARE_PROVIDER_SITE_OTHER): Payer: BC Managed Care – PPO

## 2021-07-15 DIAGNOSIS — E063 Autoimmune thyroiditis: Secondary | ICD-10-CM | POA: Diagnosis not present

## 2021-07-15 DIAGNOSIS — E038 Other specified hypothyroidism: Secondary | ICD-10-CM | POA: Diagnosis not present

## 2021-07-15 LAB — TSH: TSH: 0.27 u[IU]/mL — ABNORMAL LOW (ref 0.35–5.50)

## 2021-07-15 LAB — T4, FREE: Free T4: 1.35 ng/dL (ref 0.60–1.60)

## 2021-07-15 MED ORDER — LEVOTHYROXINE SODIUM 75 MCG PO TABS
75.0000 ug | ORAL_TABLET | Freq: Every day | ORAL | 3 refills | Status: DC
Start: 2021-07-15 — End: 2022-01-05

## 2021-08-10 DIAGNOSIS — H35371 Puckering of macula, right eye: Secondary | ICD-10-CM | POA: Diagnosis not present

## 2021-08-10 DIAGNOSIS — H5213 Myopia, bilateral: Secondary | ICD-10-CM | POA: Diagnosis not present

## 2021-08-10 DIAGNOSIS — H43811 Vitreous degeneration, right eye: Secondary | ICD-10-CM | POA: Diagnosis not present

## 2021-08-12 ENCOUNTER — Encounter (INDEPENDENT_AMBULATORY_CARE_PROVIDER_SITE_OTHER): Payer: Self-pay

## 2021-08-14 ENCOUNTER — Other Ambulatory Visit (INDEPENDENT_AMBULATORY_CARE_PROVIDER_SITE_OTHER): Payer: BC Managed Care – PPO

## 2021-08-14 DIAGNOSIS — E038 Other specified hypothyroidism: Secondary | ICD-10-CM | POA: Diagnosis not present

## 2021-08-14 DIAGNOSIS — E063 Autoimmune thyroiditis: Secondary | ICD-10-CM | POA: Diagnosis not present

## 2021-08-14 LAB — T4, FREE: Free T4: 1.02 ng/dL (ref 0.60–1.60)

## 2021-08-14 LAB — TSH: TSH: 2.24 u[IU]/mL (ref 0.35–5.50)

## 2021-09-29 ENCOUNTER — Encounter: Payer: Self-pay | Admitting: Physician Assistant

## 2021-09-30 NOTE — Telephone Encounter (Signed)
Scheduled with Dr. Alden Hipp. Tvt

## 2021-10-09 ENCOUNTER — Other Ambulatory Visit: Payer: Self-pay | Admitting: Physician Assistant

## 2021-10-09 DIAGNOSIS — I10 Essential (primary) hypertension: Secondary | ICD-10-CM

## 2021-10-09 DIAGNOSIS — F32A Depression, unspecified: Secondary | ICD-10-CM

## 2021-10-09 DIAGNOSIS — F419 Anxiety disorder, unspecified: Secondary | ICD-10-CM

## 2021-10-12 ENCOUNTER — Other Ambulatory Visit: Payer: Self-pay | Admitting: Hematology and Oncology

## 2021-10-12 DIAGNOSIS — Z1231 Encounter for screening mammogram for malignant neoplasm of breast: Secondary | ICD-10-CM

## 2021-11-30 ENCOUNTER — Ambulatory Visit
Admission: RE | Admit: 2021-11-30 | Discharge: 2021-11-30 | Disposition: A | Discharge status: Home / Self Care | Payer: BC Managed Care – PPO | Source: Ambulatory Visit | Attending: Hematology and Oncology | Admitting: Hematology and Oncology

## 2021-11-30 ENCOUNTER — Telehealth: Payer: Self-pay | Admitting: Physician Assistant

## 2021-11-30 DIAGNOSIS — Z1231 Encounter for screening mammogram for malignant neoplasm of breast: Secondary | ICD-10-CM | POA: Diagnosis not present

## 2021-11-30 DIAGNOSIS — E663 Overweight: Secondary | ICD-10-CM

## 2021-11-30 MED ORDER — WEGOVY 2.4 MG/0.75ML ~~LOC~~ SOAJ: 2.4000 mg | SUBCUTANEOUS | 0 refills | Status: DC

## 2021-11-30 NOTE — Telephone Encounter (Signed)
Patient stated during scheduling that she will need a refill of wegovy until her appointment rescheduled for Dec 13th due to provider outage. Jenna [name]

## 2021-11-30 NOTE — Telephone Encounter (Signed)
Prescription sent

## 2021-12-03 ENCOUNTER — Other Ambulatory Visit (INDEPENDENT_AMBULATORY_CARE_PROVIDER_SITE_OTHER): Payer: BC Managed Care – PPO

## 2021-12-03 ENCOUNTER — Other Ambulatory Visit: Payer: Self-pay | Admitting: Physician Assistant

## 2021-12-03 ENCOUNTER — Encounter: Payer: Self-pay | Admitting: Physician Assistant

## 2021-12-03 DIAGNOSIS — E063 Autoimmune thyroiditis: Secondary | ICD-10-CM

## 2021-12-03 DIAGNOSIS — E038 Other specified hypothyroidism: Secondary | ICD-10-CM

## 2021-12-03 DIAGNOSIS — E663 Overweight: Secondary | ICD-10-CM

## 2021-12-03 LAB — T4, FREE: Free T4: 1.17 ng/dL (ref 0.60–1.60)

## 2021-12-03 LAB — TSH: TSH: 1.25 u[IU]/mL (ref 0.35–5.50)

## 2021-12-03 MED ORDER — WEGOVY 2.4 MG/0.75ML ~~LOC~~ SOAJ: 2.4000 mg | SUBCUTANEOUS | 0 refills | Status: DC

## 2021-12-04 ENCOUNTER — Telehealth: Payer: Self-pay

## 2021-12-04 ENCOUNTER — Ambulatory Visit: Payer: BC Managed Care – PPO | Admitting: Physician Assistant

## 2021-12-04 NOTE — Telephone Encounter (Addendum)
Initiated Prior authorization [id number] 2.'4MG'$ /0.75ML auto-injectors Via: Covermymeds Case/Key:[id number] Status: approved  as of 12/04/21 Reason:, this request is approved from 12/04/2021 to 12/05/2022 Notified Pt via: Mychart

## 2021-12-16 ENCOUNTER — Ambulatory Visit (INDEPENDENT_AMBULATORY_CARE_PROVIDER_SITE_OTHER): Payer: BC Managed Care – PPO | Admitting: Physician Assistant

## 2021-12-16 ENCOUNTER — Other Ambulatory Visit (HOSPITAL_COMMUNITY)
Admission: RE | Admit: 2021-12-16 | Discharge: 2021-12-16 | Disposition: A | Discharge status: Home / Self Care | Payer: BC Managed Care – PPO | Source: Ambulatory Visit | Attending: Physician Assistant | Admitting: Physician Assistant

## 2021-12-16 VITALS — BP 121/80 | HR 80 | Ht 62.0 in | Wt 134.0 lb

## 2021-12-16 DIAGNOSIS — Z124 Encounter for screening for malignant neoplasm of cervix: Secondary | ICD-10-CM | POA: Insufficient documentation

## 2021-12-16 DIAGNOSIS — E6609 Other obesity due to excess calories: Secondary | ICD-10-CM | POA: Diagnosis not present

## 2021-12-16 DIAGNOSIS — E7849 Other hyperlipidemia: Secondary | ICD-10-CM | POA: Diagnosis not present

## 2021-12-16 DIAGNOSIS — N882 Stricture and stenosis of cervix uteri: Secondary | ICD-10-CM

## 2021-12-16 DIAGNOSIS — Z6832 Body mass index (BMI) 32.0-32.9, adult: Secondary | ICD-10-CM | POA: Diagnosis not present

## 2021-12-16 DIAGNOSIS — I10 Essential (primary) hypertension: Secondary | ICD-10-CM

## 2021-12-16 NOTE — Progress Notes (Signed)
Subjective:     Jenna [name] is a 61 y.o. woman who comes in today for a  pap smear only. Her most recent annual exam was on 11/25/2020.Her most recent Pap smear was on 07/16/2016 and showed no abnormalities. Previous abnormal Pap smears: no. Contraception: post menopausal status  Pt has lost 30+ lbs with wegovy. She is feeling great. She would like to come off some of her blood pressure medications.   The following portions of the patient's history were reviewed and updated as appropriate: allergies, current medications, past family history, past medical history, past social history, past surgical history, and problem list.  Review of Systems A comprehensive review of systems was negative.   Objective:    BP 121/80   Pulse 80   Ht '5\' 2"'$  (1.575 m)   Wt 134 lb (60.8 kg)   SpO2 100%   BMI 24.51 kg/m  Pelvic Exam: cervix normal in appearance, external genitalia normal, vagina normal without discharge, and os was stenosed . Pap smear obtained.   Assessment:    Screening pap smear.   Plan:    Marland KitchenMarland KitchenCarolann was seen today for follow-up.  Diagnoses and all orders for this visit:  Routine Papanicolaou smear -     Cytology - PAP  Primary hypertension -     COMPLETE METABOLIC PANEL WITH GFR  Other hyperlipidemia -     Lipid Panel w/reflex Direct LDL  Class 1 obesity due to excess calories with serious comorbidity and body mass index (BMI) of 32.0 to 32.9 in adult -     Lipid Panel w/reflex Direct LDL -     COMPLETE METABOLIC PANEL WITH GFR -     CBC with Differential/Platelet   Pap done today, next pap 5 years if no abnormalities.  Continue wegovy for weight Stop lisinopril, continue HCTZ Recheck BP in 2 weeks with nurse visit or at least call in readings from home Fasting labs ordered today Follow up in 6 months for weight Vitals look good and vaccines UTD Colonoscopy/mammogram UTD   Follow up in 5 years, or as indicated by Pap results.

## 2021-12-16 NOTE — Patient Instructions (Addendum)
Stop lisinopril. Continue on HCTZ. Goal BP under 130/80 nurse visit in 4 weeks.

## 2021-12-18 ENCOUNTER — Encounter: Payer: Self-pay | Admitting: Physician Assistant

## 2021-12-18 DIAGNOSIS — N882 Stricture and stenosis of cervix uteri: Secondary | ICD-10-CM | POA: Insufficient documentation

## 2021-12-18 LAB — CYTOLOGY - PAP
Comment: NEGATIVE
Diagnosis: NEGATIVE
High risk HPV: NEGATIVE

## 2021-12-18 MED ORDER — ATORVASTATIN CALCIUM 40 MG PO TABS: 40.0000 mg | ORAL_TABLET | Freq: Every day | ORAL | 3 refills | Status: DC

## 2021-12-18 NOTE — Addendum Note (Signed)
Addended by: Donella Stade on: 12/18/2021 10:09 AM   Modules accepted: Orders

## 2021-12-18 NOTE — Progress Notes (Signed)
Matie,   WBC upper limits of normal and elevated neutrophils. Could be fighting off virus etc. Recheck in 2-3 weeks.   Kidney, liver, glucose looks good.   Your calcium is up a little. Add vitamin D level.   HDL, good cholesterol, looks good. LDL, bad cholesterol, has worsened.  10 year risk is under 7.5 which is good but I would consider starting a cholesterol medication for cholesterol. Thoughts?   Marland KitchenMarland KitchenThe 10-year ASCVD risk score (Arnett DK, et al., 2019) is: 4.1%   Values used to calculate the score:     Age: 48 years     Sex: Female     Is Non-Hispanic African American: No     Diabetic: No     Tobacco smoker: No     Systolic Blood Pressure: 503 mmHg     Is BP treated: Yes     HDL Cholesterol: 64 mg/dL     Total Cholesterol: 266 mg/dL

## 2021-12-21 NOTE — Progress Notes (Signed)
Frank,   Negative for HPV or abnormal cells. Great news. Next pap in 5 years.

## 2021-12-22 LAB — CBC WITH DIFFERENTIAL/PLATELET
Absolute Monocytes: 454 cells/uL (ref 200–950)
Basophils Absolute: 43 cells/uL (ref 0–200)
Basophils Relative: 0.4 %
Eosinophils Absolute: 22 cells/uL (ref 15–500)
Eosinophils Relative: 0.2 %
HCT: 44.1 % (ref 35.0–45.0)
Hemoglobin: 14.8 g/dL (ref 11.7–15.5)
Lymphs Abs: 1782 cells/uL (ref 850–3900)
MCH: 31 pg (ref 27.0–33.0)
MCHC: 33.6 g/dL (ref 32.0–36.0)
MCV: 92.5 fL (ref 80.0–100.0)
MPV: 11.3 fL (ref 7.5–12.5)
Monocytes Relative: 4.2 %
Neutro Abs: 8500 cells/uL — ABNORMAL HIGH (ref 1500–7800)
Neutrophils Relative %: 78.7 %
Platelets: 315 10*3/uL (ref 140–400)
RBC: 4.77 10*6/uL (ref 3.80–5.10)
RDW: 12.1 % (ref 11.0–15.0)
Total Lymphocyte: 16.5 %
WBC: 10.8 10*3/uL (ref 3.8–10.8)

## 2021-12-22 LAB — COMPLETE METABOLIC PANEL WITH GFR
AG Ratio: 1.5 (calc) (ref 1.0–2.5)
ALT: 12 U/L (ref 6–29)
AST: 15 U/L (ref 10–35)
Albumin: 4.5 g/dL (ref 3.6–5.1)
Alkaline phosphatase (APISO): 63 U/L (ref 37–153)
BUN: 14 mg/dL (ref 7–25)
CO2: 30 mmol/L (ref 20–32)
Calcium: 10.5 mg/dL — ABNORMAL HIGH (ref 8.6–10.4)
Chloride: 100 mmol/L (ref 98–110)
Creat: 0.77 mg/dL (ref 0.50–1.03)
Globulin: 3.1 g/dL (calc) (ref 1.9–3.7)
Glucose, Bld: 83 mg/dL (ref 65–99)
Potassium: 4.9 mmol/L (ref 3.5–5.3)
Sodium: 141 mmol/L (ref 135–146)
Total Bilirubin: 0.6 mg/dL (ref 0.2–1.2)
Total Protein: 7.6 g/dL (ref 6.1–8.1)
eGFR: 89 mL/min/{1.73_m2} (ref >=60)

## 2021-12-22 LAB — VITAMIN D 25 HYDROXY (VIT D DEFICIENCY, FRACTURES): Vit D, 25-Hydroxy: 84 ng/mL (ref 30–100)

## 2021-12-22 LAB — LIPID PANEL W/REFLEX DIRECT LDL
Cholesterol: 266 mg/dL — ABNORMAL HIGH (ref <200)
HDL: 64 mg/dL (ref >=50)
LDL Cholesterol (Calc): 172 mg/dL (calc) — ABNORMAL HIGH
Non-HDL Cholesterol (Calc): 202 mg/dL (calc) — ABNORMAL HIGH (ref <130)
Total CHOL/HDL Ratio: 4.2 (calc) (ref <5.0)
Triglycerides: 153 mg/dL — ABNORMAL HIGH (ref <150)

## 2022-01-02 ENCOUNTER — Other Ambulatory Visit: Payer: Self-pay | Admitting: Internal Medicine

## 2022-01-02 ENCOUNTER — Other Ambulatory Visit: Payer: Self-pay | Admitting: Physician Assistant

## 2022-01-02 DIAGNOSIS — E038 Other specified hypothyroidism: Secondary | ICD-10-CM

## 2022-01-02 DIAGNOSIS — E6609 Other obesity due to excess calories: Secondary | ICD-10-CM

## 2022-01-02 DIAGNOSIS — E663 Overweight: Secondary | ICD-10-CM

## 2022-01-05 MED ORDER — WEGOVY 2.4 MG/0.75ML ~~LOC~~ SOAJ: 2.4000 mg | SUBCUTANEOUS | 1 refills | Status: DC

## 2022-01-13 ENCOUNTER — Ambulatory Visit (INDEPENDENT_AMBULATORY_CARE_PROVIDER_SITE_OTHER): Payer: BC Managed Care – PPO | Admitting: Physician Assistant

## 2022-01-13 VITALS — BP 127/82 | HR 70 | Ht 62.0 in | Wt 133.0 lb

## 2022-01-13 DIAGNOSIS — I10 Essential (primary) hypertension: Secondary | ICD-10-CM

## 2022-01-13 NOTE — Progress Notes (Signed)
Vitals look great.  Continue on same medications.  Keep regularly scheduled follow ups.

## 2022-01-13 NOTE — Progress Notes (Signed)
   Subjective:    Patient ID: Jenna [name], female    DOB: 1960-06-29, 62 y.o.   MRN: [id number]  HPI Pt here for bp check   Review of Systems     Objective:   Physical Exam        Assessment & Plan:   Pts bp today was 127/82, pt states she is off all blood pressure medication and denies any headaches or blurred vision. Pt advised to continue current plan and follow up as needed.

## 2022-03-17 ENCOUNTER — Other Ambulatory Visit: Payer: Self-pay | Admitting: Internal Medicine

## 2022-03-17 ENCOUNTER — Other Ambulatory Visit: Payer: Self-pay | Admitting: Physician Assistant

## 2022-03-17 DIAGNOSIS — I10 Essential (primary) hypertension: Secondary | ICD-10-CM

## 2022-03-17 DIAGNOSIS — Z6832 Body mass index (BMI) 32.0-32.9, adult: Secondary | ICD-10-CM

## 2022-03-22 ENCOUNTER — Other Ambulatory Visit: Payer: Self-pay | Admitting: Physician Assistant

## 2022-03-22 ENCOUNTER — Other Ambulatory Visit: Payer: Self-pay | Admitting: Internal Medicine

## 2022-03-22 DIAGNOSIS — I10 Essential (primary) hypertension: Secondary | ICD-10-CM

## 2022-03-22 DIAGNOSIS — F419 Anxiety disorder, unspecified: Secondary | ICD-10-CM

## 2022-04-05 ENCOUNTER — Other Ambulatory Visit: Payer: Self-pay | Admitting: Internal Medicine

## 2022-04-05 DIAGNOSIS — E063 Autoimmune thyroiditis: Secondary | ICD-10-CM

## 2022-04-27 ENCOUNTER — Telehealth: Payer: Self-pay | Admitting: Hematology and Oncology

## 2022-04-27 NOTE — Telephone Encounter (Signed)
Scheduled appointment per room resource. Patient is aware of the changes made to her upcoming appointment. 

## 2022-05-08 NOTE — Progress Notes (Signed)
Patient Care Team: Nolene Ebbs as PCP - General (Family Medicine) Harriette Bouillon, MD as Consulting Physician (General Surgery) Serena Croissant, MD as Consulting Physician (Hematology and Oncology) Antony Blackbird, MD as Consulting Physician (Radiation Oncology) Axel Filler Larna Daughters, NP as Nurse Practitioner (Hematology and Oncology)  DIAGNOSIS: No diagnosis found.  SUMMARY OF ONCOLOGIC HISTORY: Oncology History  Breast cancer of lower-outer quadrant of left female breast ([facility])  09/10/2015 Initial Diagnosis   Screening detected left breast asymmetry: 2 adjacent masses 4:00: 8 mm and 3:30: 9 mm; axilla negative, biopsy grade 2-3 IDC, ER 70%, PR 5%, Ki-67 10%, HER-2 positive ratio 3.41, T1BN0 stage IA   09/25/2015 Surgery   Left lumpectomy: IDC with DCIS, 1.2 cm, margins negative, 0/4 lymph nodes negative, grade 3, ER 70%, PR 5%, HER-2 positive ratio 3.45, Ki-67 10%, T1 CN 0 stage IA   10/31/2015 - 02/13/2016 Chemotherapy   Adjuvant chemotherapy with TCH 6 cycles followed by Herceptin maintenance for 1 year   03/10/2016 - 04/23/2016 Radiation Therapy   Adjuvant radiation therapy   05/28/2016 -  Anti-estrogen oral therapy   Anastrozole 1 mg daily, changed to letrozole after 2 months due to poor tolerance, switched to anastrozole due to right forearm pain from tendinitis     CHIEF COMPLIANT: Follow-up of left breast cancer on anastrozole therapy    INTERVAL HISTORY: Jenna [name] is a 62 y.o. with above-mentioned history of  left breast cancer treated with lumpectomy, adjuvant chemotherapy, radiation, and anti-estrogen therapy with anastrozole. She presents to the clinic today for annual follow-up.     ALLERGIES:  is allergic to latex and sulfa antibiotics.  MEDICATIONS:  Current Outpatient Medications  Medication Sig Dispense Refill   anastrozole (ARIMIDEX) 1 MG tablet TAKE 1 TABLET BY MOUTH EVERY DAY 90 tablet 3   atorvastatin (LIPITOR) 40 MG tablet Take 1 tablet (40  mg total) by mouth daily. 90 tablet 3   DULoxetine (CYMBALTA) 30 MG capsule TAKE 1 CAPSULE BY MOUTH EVERY DAY 90 capsule 1   hydrochlorothiazide (HYDRODIURIL) 12.5 MG tablet TAKE 1 TABLET BY MOUTH EVERY DAY 90 tablet 1   levothyroxine (SYNTHROID) 75 MCG tablet TAKE 1 TABLET BY MOUTH EVERY DAY 90 tablet 1   WEGOVY 2.4 MG/0.75ML SOAJ Inject 2.4 mg into the skin once a week. 9 mL 1   No current facility-administered medications for this visit.    PHYSICAL EXAMINATION: ECOG PERFORMANCE STATUS: {CHL ONC ECOG PS:[id number]}  There were no vitals filed for this visit. There were no vitals filed for this visit.  BREAST:*** No palpable masses or nodules in either right or left breasts. No palpable axillary supraclavicular or infraclavicular adenopathy no breast tenderness or nipple discharge. (exam performed in the presence of a chaperone)  LABORATORY DATA:  I have reviewed the data as listed    Latest Ref Rng & Units 12/16/2021    9:41 AM 11/05/2020    2:40 PM 10/30/2019    9:56 AM  CMP  Glucose 65 - 99 mg/dL 83  82  87   BUN 7 - 25 mg/dL 14  18  15    Creatinine 0.50 - 1.03 mg/dL 1.61  0.96  0.45   Sodium 135 - 146 mmol/L 141  141  145   Potassium 3.5 - 5.3 mmol/L 4.9  5.5  5.1   Chloride 98 - 110 mmol/L 100  103  104   CO2 20 - 32 mmol/L 30  29  27    Calcium 8.6 - 10.4 mg/dL  10.5  9.9  9.9   Total Protein 6.1 - 8.1 g/dL 7.6  7.5  7.2   Total Bilirubin 0.2 - 1.2 mg/dL 0.6  0.3  0.4   Alkaline Phos 44 - 121 IU/L   84   AST 10 - 35 U/L 15  24  23    ALT 6 - 29 U/L 12  27  25      Lab Results  Component Value Date   WBC 10.8 12/16/2021   HGB 14.8 12/16/2021   HCT 44.1 12/16/2021   MCV 92.5 12/16/2021   PLT 315 12/16/2021   NEUTROABS 8,500 (H) 12/16/2021    ASSESSMENT & PLAN:  No problem-specific Assessment & Plan notes found for this encounter.    No orders of the defined types were placed in this encounter.  The patient has a good understanding of the overall plan. she  agrees with it. she will call with any problems that may develop before the next visit here. Total time spent: 30 mins including face to face time and time spent for planning, charting and co-ordination of care   Sherlyn Lick, CMA 05/08/22    I Janan Ridge am acting as a Neurosurgeon for [employer]  ***

## 2022-05-10 ENCOUNTER — Ambulatory Visit: Payer: BC Managed Care – PPO | Admitting: Hematology and Oncology

## 2022-05-13 ENCOUNTER — Inpatient Hospital Stay: Payer: BC Managed Care – PPO | Attending: Hematology and Oncology | Admitting: Hematology and Oncology

## 2022-05-13 ENCOUNTER — Other Ambulatory Visit: Payer: Self-pay

## 2022-05-13 VITALS — BP 113/68 | HR 71 | Temp 97.7°F | Resp 16 | Ht 62.0 in | Wt 130.8 lb

## 2022-05-13 DIAGNOSIS — Z170 Estrogen receptor positive status [ER+]: Secondary | ICD-10-CM | POA: Insufficient documentation

## 2022-05-13 DIAGNOSIS — C50512 Malignant neoplasm of lower-outer quadrant of left female breast: Secondary | ICD-10-CM | POA: Insufficient documentation

## 2022-05-13 DIAGNOSIS — Z79811 Long term (current) use of aromatase inhibitors: Secondary | ICD-10-CM | POA: Diagnosis not present

## 2022-05-13 MED ORDER — ANASTROZOLE 1 MG PO TABS: 1.0000 mg | ORAL_TABLET | Freq: Every day | ORAL | 3 refills | Status: DC

## 2022-05-13 NOTE — Assessment & Plan Note (Addendum)
Left lumpectomy 09/25/2015: IDC with DCIS, 1.2 cm, margins negative, 0/4 lymph nodes negative, grade 3, ER 70%, PR 5%, HER-2 positive ratio 3.45, Ki-67 10%, T1 CN 0 stage IA   Treatment plan: 1. Adjuvant chemotherapy with TCH 6 cycles 10/31/2015- 02/13/2016 followed by Herceptin every 3 weeks for 1 year completed 07/30/2016 2. Followed by adjuvant radiation 03/10/2016-  04/23/2016  3. Followed by adjuvant antiestrogen therapy started 05/28/2016 ---------------------------------------------------------------------------------------------------------------------------------- Anastrozole toxicities : Does not have any myalgias Plan to treat her for 7 years.   Survivorship: She lost 45 pounds weight on Wegovy.  She is feeling a lot healthier.  She spends a lot of time tending to her vegetable and fruit garden.   CT CAP 02/07/2017: No evidence of metastatic disease, 4 mm subpleural left lower lobe nodule nonspecific Bone scan 02/07/2017: No findings of metastatic disease   Breast cancer surveillance: 11/30/2021: Mammogram: Benign.  05/13/2022: Breast exam: Benign She stays extremely busy with gardening and tending to her fruit orchard as well as her vegetable garden.  Return to clinic in 1 year for follow-up

## 2022-05-27 ENCOUNTER — Encounter: Payer: Self-pay | Admitting: Internal Medicine

## 2022-05-27 ENCOUNTER — Ambulatory Visit: Payer: BC Managed Care – PPO | Admitting: Internal Medicine

## 2022-05-27 VITALS — BP 122/80 | HR 62 | Ht 62.0 in | Wt 131.2 lb

## 2022-05-27 DIAGNOSIS — E063 Autoimmune thyroiditis: Secondary | ICD-10-CM

## 2022-05-27 DIAGNOSIS — E038 Other specified hypothyroidism: Secondary | ICD-10-CM | POA: Diagnosis not present

## 2022-05-27 LAB — T4, FREE: Free T4: 1.14 ng/dL (ref 0.60–1.60)

## 2022-05-27 LAB — TSH: TSH: 1.83 u[IU]/mL (ref 0.35–5.50)

## 2022-05-27 MED ORDER — LEVOTHYROXINE SODIUM 75 MCG PO TABS
75.0000 ug | ORAL_TABLET | Freq: Every day | ORAL | 3 refills | Status: DC
Start: 2022-05-27 — End: 2023-06-09

## 2022-05-27 NOTE — Progress Notes (Signed)
Patient ID: Jenna [name], female   DOB: 04-09-1960, 62 y.o.   MRN: [id number]   HPI  Jenna [name] is a 62 y.o.-year-old female, initially referred by Dr. Rinaldo Ratel, returning for follow-up forHashimoto's hypothyroidism.  Last visit 1 year ago.  Interim history: She previously was seen in the weight management clinic, but then stopped.  At last visit she was on Wegovy, started 3 months prior to the appointment.  She was also adhering to the dietary principles from the weight management clinic.  She lost ~40 lbs 1 ~1.5 years.  She for right, her prediabetes for salty foods and alcohol are gone, she was taken off her blood pressure medicines and most likely will be taken off her cholesterol medicines, also.  Her goal weight is approximately 120 pounds.  However, she will soon start reducing the [facility] dose.  Reviewed and addended history: She was diagnosed with Hashimoto's hypothyroidism in 2015-2014 after she described lethargy, inability to lose weight, hair loss.  She was started on Levothyroxine (LT4) initially, but quickly changed to liothyronine (LT3) 50 mcg daily >> then changed to 25 mcg daily in 04/2019 (equivalent of 100 mcg levothyroxine daily).  On this regimen, her TSH was suppressed.  In 05/2019, we changed to: - LT4 50 mcg daily - LT3 12.5 mcg daily For an equivalent of LT4 of 100 mcg daily  In 06/2019, we changed to: - LT4 88 mcg daily - LT3 5 mcg daily For an equivalent of LT4 of 108 mcg daily  In 11/2019, we changed to:  - LT4 100 mcg daily  - LT3 2.5 mcg daily  For an equivalent of 110 mcg daily LT4  In 05/2019, we changed to: - LT4 112 mcg daily  In 08/2020, we decreased the dose to: - LT4 100 mcg daily  In 05/2021, we decreased the dose to: - LT4 88 mcg daily  In 07/2021, we decreased the dose to: - LT4 75 mcg daily  She takes LT4: - previously at night (2-3 hours after dinner) >> now in a.m. - with a full glass of water - b'fast after 2-3 hours  after - black coffee - no Calcium - + MVI - at night - prev.  On omeprazole twice a day, now off - prev.  On biotin, now off  I reviewed her TFTs: Lab Results  Component Value Date   TSH 1.25 12/03/2021   TSH 2.24 08/14/2021   TSH 0.27 (L) 07/15/2021   TSH 0.43 06/02/2021   TSH 2.22 11/18/2020   TSH 0.39 08/29/2020   TSH 0.79 05/13/2020   TSH 3.22 11/12/2019   TSH 2.36 06/21/2019   TSH 2.15 05/14/2019   FREET4 1.17 12/03/2021   FREET4 1.02 08/14/2021   FREET4 1.35 07/15/2021   FREET4 1.46 06/02/2021   FREET4 1.12 11/18/2020   FREET4 1.10 08/29/2020   FREET4 1.09 05/13/2020   FREET4 0.77 11/12/2019   FREET4 0.77 06/21/2019   FREET4 0.21 (L) 05/14/2019    Her thyroid antibodies were elevated: Component     Latest Ref Rng & Units 11/12/2019  Thyroperoxidase Ab SerPl-aCnc     <9 IU/mL 335 (H)  Thyroglobulin Ab     < or = 1 IU/mL 16 (H)   Component     Latest Ref Rng & Units 05/14/2019  Thyroperoxidase Ab SerPl-aCnc     <9 IU/mL 379 (H)  Thyroglobulin Ab     < or = 1 IU/mL 13 (H)  Previously: 08/12/2015: ATA 14 (<2), TPO 162 ( (<  9)  In 05/2019, I advised her to start selenium 200 mcg daily.  Her antithyroid antibodies did not increase significantly while on this in the past >> stopped.  Pt denies: - feeling nodules in neck - hoarseness - dysphagia - choking  No FH of thyroid cancer. + FH of autoimmune ds.: sister and brother. Father with SLE. P cousins with Hashimoto's thyroiditis. No h/o radiation tx to head or neck. No herbal supplements. No Biotin use. No recent steroids use.   Pt. also has a history of breast cancer.  She is on anastrozole. She walks for exercise. Swimming during the summer. She continues to take Cymbalta for chronic anxiety.  ROS: + See HPI  Past Medical History:  Diagnosis Date   Anxiety    Asthma    Breast cancer (HCC) 2017   Left Breast Cancer   Breast cancer of lower-outer quadrant of left female breast (HCC) 09/12/2015    Complication of anesthesia    hard time waking up with gallbladder   Depression    Gallbladder problem    GERD (gastroesophageal reflux disease)    Hashimoto's disease    Heartburn    History of external beam radiation therapy 03/10/16-04/23/16   left breast/50.4 Gy in 28 fractions, left breast boost/10 Gy in 5 fractions   Hypothyroidism    Irritable bowel    Joint pain    Personal history of chemotherapy 2017-2018   Left Breast Cancer   Personal history of radiation therapy 2018   Left Breast Cancer   Uterine displacement    Past Surgical History:  Procedure Laterality Date   BACK SURGERY     BREAST BIOPSY Left 09/10/2015   BREAST LUMPECTOMY Left 09/25/2015   BREAST LUMPECTOMY WITH NEEDLE LOCALIZATION AND AXILLARY SENTINEL LYMPH NODE BX Left 09/25/2015   Procedure: BREAST LUMPECTOMY WITH NEEDLE LOCALIZATION X'S 2 AND AXILLARY SENTINEL LYMPH NODE BX;  Surgeon: Harriette Bouillon, MD;  Location: [facility];  Service: General;  Laterality: Left;   CHOLECYSTECTOMY OPEN     COLONOSCOPY WITH PROPOFOL N/A 04/13/2019   Procedure: COLONOSCOPY WITH PROPOFOL;  Surgeon: Wyline Mood, MD;  Location: [facility];  Service: Gastroenterology;  Laterality: N/A;   disk fusion     ENDOMETRIAL ABLATION     MASTOPEXY Bilateral 10/25/2016   Procedure: BILATERAL MASTOPEXY;  Surgeon: Glenna Fellows, MD;  Location: [facility];  Service: Plastics;  Laterality: Bilateral;   neck fusion     PORT-A-CATH REMOVAL Right 10/25/2016   Procedure: RIGHT CHEST PORT REMOVAL;  Surgeon: Glenna Fellows, MD;  Location: [facility];  Service: Plastics;  Laterality: Right;   PORTACATH PLACEMENT Right 09/25/2015   Procedure: INSERTION PORT-A-CATH;  Surgeon: Harriette Bouillon, MD;  Location: [facility];  Service: General;  Laterality: Right;   REDUCTION MAMMAPLASTY Bilateral 2018   Social History   Socioeconomic History   Marital status: Married    Spouse name:  Not on file   Number of children: 2   Years of education: Not on file   Highest education level: Not on file  Occupational History   Occupation: n/a  Tobacco Use   Smoking status: Former    Years: 15    Types: Cigarettes    Quit date: 1986    Years since quitting: 38.4   Smokeless tobacco: Never  Vaping Use   Vaping Use: Never used  Substance and Sexual Activity   Alcohol use: Yes    Alcohol/week: 2.0 - 3.0 standard drinks of  alcohol    Types: 2 - 3 Standard drinks or equivalent per week    Comment: social   Drug use: No   Sexual activity: Yes    Partners: Male  Other Topics Concern   Not on file  Social History Narrative   Not on file   Social Determinants of Health   Financial Resource Strain: Not on file  Food Insecurity: Not on file  Transportation Needs: Not on file  Physical Activity: Not on file  Stress: Not on file  Social Connections: Not on file  Intimate Partner Violence: Not on file   Current Outpatient Medications on File Prior to Visit  Medication Sig Dispense Refill   anastrozole (ARIMIDEX) 1 MG tablet Take 1 tablet (1 mg total) by mouth daily. 90 tablet 3   atorvastatin (LIPITOR) 40 MG tablet Take 1 tablet (40 mg total) by mouth daily. 90 tablet 3   DULoxetine (CYMBALTA) 30 MG capsule TAKE 1 CAPSULE BY MOUTH EVERY DAY 90 capsule 1   hydrochlorothiazide (HYDRODIURIL) 12.5 MG tablet TAKE 1 TABLET BY MOUTH EVERY DAY 90 tablet 1   levothyroxine (SYNTHROID) 75 MCG tablet TAKE 1 TABLET BY MOUTH EVERY DAY 90 tablet 1   WEGOVY 2.4 MG/0.75ML SOAJ Inject 2.4 mg into the skin once a week. 9 mL 1   No current facility-administered medications on file prior to visit.   Allergies  Allergen Reactions   Latex Rash   Sulfa Antibiotics Rash   Family History  Problem Relation Age of Onset   Diabetes Mother    Hypertension Mother    Hyperlipidemia Mother    Kidney disease Mother    Obesity Mother    Obesity Father    Cancer Maternal Grandfather    Breast  cancer Cousin 18   Breast cancer Cousin    Breast cancer Cousin    Thyroid disease Other   Also, please see HPI. HTN in oldest brother.  PE: BP 122/80 (BP Location: Right Arm, Patient Position: Sitting, Cuff Size: Normal)   Pulse 62   Ht 5\' 2"  (1.575 m)   Wt 131 lb 3.2 oz (59.5 kg)   SpO2 96%   BMI 24.00 kg/m  Wt Readings from Last 3 Encounters:  05/27/22 131 lb 3.2 oz (59.5 kg)  05/13/22 130 lb 12.8 oz (59.3 kg)  01/13/22 133 lb (60.3 kg)   Constitutional: normal weight, in NAD Eyes:  EOMI, no exophthalmos ENT: no neck masses, no cervical lymphadenopathy Cardiovascular: RRR, No MRG Respiratory: CTA B Musculoskeletal: no deformities Skin:no rashes Neurological: no tremor with outstretched hands  ASSESSMENT: 1.  Hypothyroidism due to Hashimoto's thyroiditis  PLAN: 1.  Patient with history of Hashimoto's thyroiditis with subsequent hypothyroidism, on replacement with only liothyronine at our first visit, which we then changed to a levothyroxine only regimen.  She is currently doing well on this.  She is also on selenium 200 mcg daily. - latest thyroid labs reviewed with pt. >> normal: Lab Results  Component Value Date   TSH 1.25 12/03/2021  - she continues on LT4 75 mcg daily, dose decreased from 100 mcg daily at last visit and then decreased further in 07/2021, when the TSH was slightly suppressed, most likely due to weight loss.  We discussed that significant weight loss or weight gain can change LT4 requirements in patients with hypothyroidism - pt feels good on this dose. - we discussed about taking the thyroid hormone every day, with water, >30 minutes before breakfast, separated by >4 hours from acid  reflux medications, calcium, iron, multivitamins. Pt. is taking it correctly. - will check thyroid tests today: TSH and fT4 - If labs are abnormal, she will need to return for repeat TFTs in 1.5 months - I we will see her back in a year but possibly sooner for labs.  If  labs are abnormal, I advised her to come back in approximately 6 months for repeat labs.  Will need a refill LT4 to last her until next visit.  Component     Latest Ref Rng 05/27/2022  TSH     0.35 - 5.50 uIU/mL 1.83   T4,Free(Direct)     0.60 - 1.60 ng/dL 0.10   TFTs are at goal.   Carlus Pavlov, MD PhD [facility] Endocrinology

## 2022-05-27 NOTE — Patient Instructions (Signed)
Please continue Levothyroxine 75 mcg daily.  Take the thyroid hormone every day, with water, at least 30 minutes before breakfast, separated by at least 4 hours from: - acid reflux medications - calcium - iron - multivitamins  Please stop at the lab.  Please come back for a follow-up appointment in 1 year - but for labs in 6 months.

## 2022-06-03 ENCOUNTER — Encounter: Payer: BC Managed Care – PPO | Admitting: Genetic Counselor

## 2022-06-03 ENCOUNTER — Ambulatory Visit: Payer: BC Managed Care – PPO | Admitting: Internal Medicine

## 2022-06-03 ENCOUNTER — Other Ambulatory Visit: Payer: BC Managed Care – PPO

## 2022-06-16 ENCOUNTER — Ambulatory Visit: Payer: BC Managed Care – PPO | Admitting: Physician Assistant

## 2022-08-10 ENCOUNTER — Inpatient Hospital Stay: Payer: BC Managed Care – PPO | Attending: Hematology and Oncology | Admitting: Genetic Counselor

## 2022-08-10 ENCOUNTER — Other Ambulatory Visit: Payer: Self-pay | Admitting: Physician Assistant

## 2022-08-10 ENCOUNTER — Inpatient Hospital Stay: Payer: BC Managed Care – PPO

## 2022-08-10 DIAGNOSIS — F32A Depression, unspecified: Secondary | ICD-10-CM

## 2022-08-10 DIAGNOSIS — I10 Essential (primary) hypertension: Secondary | ICD-10-CM

## 2022-08-16 ENCOUNTER — Encounter: Payer: Self-pay | Admitting: Physician Assistant

## 2022-08-16 ENCOUNTER — Ambulatory Visit: Payer: BC Managed Care – PPO | Admitting: Physician Assistant

## 2022-08-16 VITALS — BP 127/76 | HR 67 | Ht 62.0 in | Wt 131.0 lb

## 2022-08-16 DIAGNOSIS — E663 Overweight: Secondary | ICD-10-CM

## 2022-08-16 DIAGNOSIS — Z800 Family history of malignant neoplasm of digestive organs: Secondary | ICD-10-CM

## 2022-08-16 DIAGNOSIS — I10 Essential (primary) hypertension: Secondary | ICD-10-CM | POA: Diagnosis not present

## 2022-08-16 DIAGNOSIS — Z23 Encounter for immunization: Secondary | ICD-10-CM | POA: Diagnosis not present

## 2022-08-16 MED ORDER — WEGOVY 2.4 MG/0.75ML ~~LOC~~ SOAJ
2.4000 mg | SUBCUTANEOUS | 1 refills | Status: DC
Start: 2022-08-16 — End: 2023-02-04

## 2022-08-16 NOTE — Patient Instructions (Signed)
Will get you to come back for myraid testing when we get kit.

## 2022-08-16 NOTE — Progress Notes (Signed)
Established Patient Office Visit  Subjective   Patient ID: Jenna [name], female    DOB: 06-Dec-1960  Age: 62 y.o. MRN: [id number]  Chief Complaint  Patient presents with   Medical Management of Chronic Issues    Wegovy/bp    HPI Pt is a 62 yo female with HTN who presents to the clinic to follow up. She has been on wegovy and since 12/2021 and lost 46lbs but stable over the last 3 months. She is tolerating well. She feels great. Her TSH was just checked and taking medication regularly.     Mother dx with lynch syndrome and past away. Last colonoscopy 04/2019. Would like to be tested.   Upcoming travel and needs Tdap.   .. Active Ambulatory Problems    Diagnosis Date Noted   Hashimoto's thyroiditis 02/25/2014   Allergic rhinitis 02/25/2014   Uterine fibroid 02/25/2014   Hyperlipidemia 02/25/2014   Multiple joint pain 02/25/2014   Postmenopausal 10/29/2014   Irritability and anger 12/11/2014   Anxiety and depression 12/11/2014   Hypertriglyceridemia 08/13/2015   Constipation 08/15/2015   Family history of systemic lupus erythematosus 08/15/2015   Bloating 08/15/2015   Multiple food allergies 08/19/2015   Left breast mass 09/02/2015   Breast cancer of lower-outer quadrant of left female breast (HCC) 09/12/2015   Malignant neoplasm of lower-outer quadrant of left breast of female, estrogen receptor positive (HCC) 10/13/2015   Antineoplastic chemotherapy induced anemia 12/12/2015   Chemotherapy-induced thrombocytopenia 01/23/2016   Current moderate episode of major depressive disorder without prior episode (HCC) 07/16/2016   Hyperkalemia 07/16/2016   Breast tenderness 07/16/2016   Port catheter in place 07/30/2016   Arthralgia of left temporomandibular joint 06/13/2017   De Quervain's tenosynovitis, right 04/24/2018   Left ear pain 12/12/2018   Hot flashes 12/12/2018   Panic attacks 12/12/2018   History of breast cancer 09/21/2016   History of therapeutic radiation  09/21/2016   Osteopenia 04/09/2019   Class 1 obesity due to excess calories with serious comorbidity and body mass index (BMI) of 32.0 to 32.9 in adult 03/17/2021   Primary hypertension 03/17/2021   Cervical os stenosis 12/18/2021   Overweight (BMI 25.0-29.9) 08/16/2022   Family history of Lynch syndrome 08/16/2022   Resolved Ambulatory Problems    Diagnosis Date Noted   GERD (gastroesophageal reflux disease) 02/25/2014   Insomnia 12/11/2014   Exposure to COVID-19 virus 05/24/2018   Past Medical History:  Diagnosis Date   Anxiety    Asthma    Breast cancer (HCC) 2017   Complication of anesthesia    Depression    Gallbladder problem    Hashimoto's disease    Heartburn    History of external beam radiation therapy 03/10/16-04/23/16   Hypothyroidism    Irritable bowel    Joint pain    Personal history of chemotherapy 2017-2018   Personal history of radiation therapy 2018   Uterine displacement      ROS   See HPI.  Objective:     BP 127/76   Pulse 67   Ht 5\' 2"  (1.575 m)   Wt 131 lb (59.4 kg)   SpO2 99%   BMI 23.96 kg/m  BP Readings from Last 3 Encounters:  08/16/22 127/76  05/27/22 122/80  05/13/22 113/68   Wt Readings from Last 3 Encounters:  08/16/22 131 lb (59.4 kg)  05/27/22 131 lb 3.2 oz (59.5 kg)  05/13/22 130 lb 12.8 oz (59.3 kg)      Physical Exam Constitutional:  Appearance: Normal appearance.  HENT:     Head: Normocephalic.  Cardiovascular:     Rate and Rhythm: Normal rate and regular rhythm.  Pulmonary:     Effort: Pulmonary effort is normal.     Breath sounds: Normal breath sounds.  Neurological:     General: No focal deficit present.     Mental Status: She is alert and oriented to person, place, and time.  Psychiatric:        Mood and Affect: Mood normal.      The 10-year ASCVD risk score (Arnett DK, et al., 2019) is: 4.9%    Assessment & Plan:  Marland KitchenMarland KitchenCarolann was seen today for medical management of chronic  issues.  Diagnoses and all orders for this visit:  Overweight (BMI 25.0-29.9) -     WEGOVY 2.4 MG/0.75ML SOAJ; Inject 2.4 mg into the skin once a week.  Primary hypertension -     WEGOVY 2.4 MG/0.75ML SOAJ; Inject 2.4 mg into the skin once a week.  Family history of Lynch syndrome  Need for Tdap vaccination -     Tdap vaccine greater than or equal to 7yo IM   BP looks great. Continue on hydrochlorothiazide.  Weight loss is stable but down 46lbs all together.  Continue on wegovy Labs UTD Mother has lynch syndrome, will order myriad kit to get pt tested.  Tdap needed for upcoming travel     Return in about 6 months (around 02/16/2023).    Tandy Gaw, PA-C

## 2022-08-24 ENCOUNTER — Other Ambulatory Visit: Payer: Self-pay | Admitting: Physician Assistant

## 2022-08-26 DIAGNOSIS — H5051 Esophoria: Secondary | ICD-10-CM | POA: Diagnosis not present

## 2022-08-26 DIAGNOSIS — H2513 Age-related nuclear cataract, bilateral: Secondary | ICD-10-CM | POA: Diagnosis not present

## 2022-08-26 DIAGNOSIS — H5213 Myopia, bilateral: Secondary | ICD-10-CM | POA: Diagnosis not present

## 2022-08-26 DIAGNOSIS — H43811 Vitreous degeneration, right eye: Secondary | ICD-10-CM | POA: Diagnosis not present

## 2022-08-26 DIAGNOSIS — H35371 Puckering of macula, right eye: Secondary | ICD-10-CM | POA: Diagnosis not present

## 2022-08-30 ENCOUNTER — Telehealth: Payer: Self-pay | Admitting: Physician Assistant

## 2022-08-30 NOTE — Telephone Encounter (Signed)
Patient called wanting to schedule a nurse visit for a RSV shot. Can we schedule this appointment?

## 2022-08-30 NOTE — Telephone Encounter (Signed)
Yes for a nurse visit.

## 2022-08-30 NOTE — Telephone Encounter (Signed)
Patient is scheduled with the nurse for RSV.

## 2022-09-01 ENCOUNTER — Ambulatory Visit (INDEPENDENT_AMBULATORY_CARE_PROVIDER_SITE_OTHER): Payer: BC Managed Care – PPO

## 2022-09-01 VITALS — Temp 98.1°F

## 2022-09-01 DIAGNOSIS — Z2911 Encounter for prophylactic immunotherapy for respiratory syncytial virus (RSV): Secondary | ICD-10-CM

## 2022-09-01 DIAGNOSIS — Z23 Encounter for immunization: Secondary | ICD-10-CM | POA: Diagnosis not present

## 2022-09-01 NOTE — Progress Notes (Signed)
   Established Patient Office Visit  Subjective   Patient ID: MONROE PRUCHA, female    DOB: February 04, 1960  Age: 62 y.o. MRN: [id number]  Chief Complaint  Patient presents with   Immunizations    HPI  Heavan SAMARY GOGGINS is here for Flu and RSV vaccine.   MyRisk hereditary cancer test completed.   ROS    Objective:     Temp 98.1 F (36.7 C) (Oral)    Physical Exam   No results found for any visits on 09/01/22.    The 10-year ASCVD risk score (Arnett DK, et al., 2019) is: 4.9%    Assessment & Plan:  Immunization - Patient tolerated injections well without complications.   Problem List Items Addressed This Visit   None Visit Diagnoses     Needs flu shot    -  Primary   Relevant Orders   Flu vaccine trivalent PF, 6mos and older(Flulaval,Afluria,Fluarix,Fluzone) (Completed)   Need for RSV immunization       Relevant Orders   RSV,Recombinant PF (Arexvy) (Completed)       Return in about 1 year (around 09/01/2023) for flu vaccine.Earna Coder, Janalyn Harder, CMA

## 2022-10-07 ENCOUNTER — Encounter: Payer: Self-pay | Admitting: Physician Assistant

## 2022-10-08 NOTE — Telephone Encounter (Signed)
I got a fax about a week ago that needed me to confirm testing I did that but have not seen any results. Can you call and check on it?

## 2022-10-20 ENCOUNTER — Ambulatory Visit (INDEPENDENT_AMBULATORY_CARE_PROVIDER_SITE_OTHER): Payer: BC Managed Care – PPO

## 2022-10-20 DIAGNOSIS — Z23 Encounter for immunization: Secondary | ICD-10-CM

## 2022-10-20 DIAGNOSIS — Z853 Personal history of malignant neoplasm of breast: Secondary | ICD-10-CM | POA: Diagnosis not present

## 2022-10-20 DIAGNOSIS — Z800 Family history of malignant neoplasm of digestive organs: Secondary | ICD-10-CM | POA: Diagnosis not present

## 2022-10-20 NOTE — Progress Notes (Signed)
Covid vaccine - Patient tolerated injection well without complications.   MyRisk Gene testing sent out through [employer].

## 2022-12-05 ENCOUNTER — Other Ambulatory Visit: Payer: Self-pay | Admitting: Physician Assistant

## 2023-01-04 ENCOUNTER — Other Ambulatory Visit: Payer: Self-pay | Admitting: Physician Assistant

## 2023-01-04 DIAGNOSIS — Z1231 Encounter for screening mammogram for malignant neoplasm of breast: Secondary | ICD-10-CM

## 2023-01-26 ENCOUNTER — Ambulatory Visit
Admission: RE | Admit: 2023-01-26 | Discharge: 2023-01-26 | Disposition: A | Discharge status: Home / Self Care | Payer: BC Managed Care – PPO | Source: Ambulatory Visit | Attending: Physician Assistant | Admitting: Physician Assistant

## 2023-01-26 DIAGNOSIS — Z1231 Encounter for screening mammogram for malignant neoplasm of breast: Secondary | ICD-10-CM | POA: Diagnosis not present

## 2023-01-27 ENCOUNTER — Encounter: Payer: Self-pay | Admitting: Physician Assistant

## 2023-01-27 NOTE — Progress Notes (Signed)
Normal mammogram. Follow up in 1 year.

## 2023-02-04 ENCOUNTER — Other Ambulatory Visit: Payer: Self-pay | Admitting: Physician Assistant

## 2023-02-04 DIAGNOSIS — I10 Essential (primary) hypertension: Secondary | ICD-10-CM

## 2023-02-04 DIAGNOSIS — E663 Overweight: Secondary | ICD-10-CM

## 2023-02-16 ENCOUNTER — Encounter: Payer: Self-pay | Admitting: Physician Assistant

## 2023-02-16 ENCOUNTER — Ambulatory Visit (INDEPENDENT_AMBULATORY_CARE_PROVIDER_SITE_OTHER): Payer: BC Managed Care – PPO | Admitting: Physician Assistant

## 2023-02-16 VITALS — BP 132/86 | HR 94 | Ht 62.0 in | Wt 129.0 lb

## 2023-02-16 DIAGNOSIS — I10 Essential (primary) hypertension: Secondary | ICD-10-CM | POA: Diagnosis not present

## 2023-02-16 DIAGNOSIS — Z853 Personal history of malignant neoplasm of breast: Secondary | ICD-10-CM

## 2023-02-16 DIAGNOSIS — E063 Autoimmune thyroiditis: Secondary | ICD-10-CM

## 2023-02-16 DIAGNOSIS — Z23 Encounter for immunization: Secondary | ICD-10-CM | POA: Diagnosis not present

## 2023-02-16 DIAGNOSIS — E782 Mixed hyperlipidemia: Secondary | ICD-10-CM

## 2023-02-16 DIAGNOSIS — F419 Anxiety disorder, unspecified: Secondary | ICD-10-CM | POA: Diagnosis not present

## 2023-02-16 DIAGNOSIS — M8588 Other specified disorders of bone density and structure, other site: Secondary | ICD-10-CM | POA: Diagnosis not present

## 2023-02-16 DIAGNOSIS — E781 Pure hyperglyceridemia: Secondary | ICD-10-CM

## 2023-02-16 DIAGNOSIS — Z8639 Personal history of other endocrine, nutritional and metabolic disease: Secondary | ICD-10-CM | POA: Insufficient documentation

## 2023-02-16 DIAGNOSIS — F32A Depression, unspecified: Secondary | ICD-10-CM

## 2023-02-16 MED ORDER — DULOXETINE HCL 30 MG PO CPEP: 30.0000 mg | ORAL_CAPSULE | Freq: Every day | ORAL | 1 refills | Status: DC

## 2023-02-16 MED ORDER — HYDROCHLOROTHIAZIDE 12.5 MG PO TABS: 12.5000 mg | ORAL_TABLET | Freq: Every day | ORAL | 1 refills | Status: DC

## 2023-02-16 NOTE — Progress Notes (Signed)
Established Patient Office Visit  Subjective   Patient ID: Jenna [name], female    DOB: 01-26-1960  Age: 63 y.o. MRN: [id number]  Chief Complaint  Patient presents with   Medical Management of Chronic Issues    6 mo Wgt manangement and htn fup    HPI Pt is a 63 yo female who presents to the clinic to follow up.   She is doing great on wegovy. She has no side effects. She is down by 55lbs. She is walking regularly. She has more energy. She is in breast cancer remission. Her mood is great.   She is compliant with lipitor and thyroid medication. No concerns.   .. Active Ambulatory Problems    Diagnosis Date Noted   Hashimoto's thyroiditis 02/25/2014   Allergic rhinitis 02/25/2014   Uterine fibroid 02/25/2014   Hyperlipidemia 02/25/2014   Multiple joint pain 02/25/2014   Postmenopausal 10/29/2014   Irritability and anger 12/11/2014   Anxiety and depression 12/11/2014   Hypertriglyceridemia 08/13/2015   Constipation 08/15/2015   Family history of systemic lupus erythematosus 08/15/2015   Bloating 08/15/2015   Multiple food allergies 08/19/2015   Left breast mass 09/02/2015   Breast cancer of lower-outer quadrant of left female breast (HCC) 09/12/2015   Malignant neoplasm of lower-outer quadrant of left breast of female, estrogen receptor positive (HCC) 10/13/2015   Antineoplastic chemotherapy induced anemia 12/12/2015   Chemotherapy-induced thrombocytopenia 01/23/2016   Current moderate episode of major depressive disorder without prior episode (HCC) 07/16/2016   Hyperkalemia 07/16/2016   Breast tenderness 07/16/2016   Port catheter in place 07/30/2016   Arthralgia of left temporomandibular joint 06/13/2017   De Quervain's tenosynovitis, right 04/24/2018   Left ear pain 12/12/2018   Hot flashes 12/12/2018   Panic attacks 12/12/2018   History of breast cancer 09/21/2016   History of therapeutic radiation 09/21/2016   Osteopenia 04/09/2019   Class 1 obesity due to  excess calories with serious comorbidity and body mass index (BMI) of 32.0 to 32.9 in adult 03/17/2021   Primary hypertension 03/17/2021   Cervical os stenosis 12/18/2021   Overweight (BMI 25.0-29.9) 08/16/2022   Family history of Lynch syndrome 08/16/2022   History of obesity 02/16/2023   Resolved Ambulatory Problems    Diagnosis Date Noted   GERD (gastroesophageal reflux disease) 02/25/2014   Insomnia 12/11/2014   Exposure to COVID-19 virus 05/24/2018   Past Medical History:  Diagnosis Date   Anxiety    Asthma    Breast cancer (HCC) 2017   Complication of anesthesia    Depression    Gallbladder problem    Hashimoto's disease    Heartburn    History of external beam radiation therapy 03/10/16-04/23/16   Hypothyroidism    Irritable bowel    Joint pain    Personal history of chemotherapy 2017-2018   Personal history of radiation therapy 2018   Uterine displacement      Review of Systems  All other systems reviewed and are negative.     Objective:     BP 132/86   Pulse 94   Ht 5\' 2"  (1.575 m)   Wt 129 lb (58.5 kg)   SpO2 99%   BMI 23.59 kg/m  BP Readings from Last 3 Encounters:  02/16/23 132/86  08/16/22 127/76  05/27/22 122/80   Wt Readings from Last 3 Encounters:  02/16/23 129 lb (58.5 kg)  08/16/22 131 lb (59.4 kg)  05/27/22 131 lb 3.2 oz (59.5 kg)      Physical  Exam Constitutional:      Appearance: Normal appearance.  HENT:     Head: Normocephalic.  Cardiovascular:     Rate and Rhythm: Normal rate and regular rhythm.     Pulses: Normal pulses.  Pulmonary:     Effort: Pulmonary effort is normal.     Breath sounds: Normal breath sounds.  Musculoskeletal:     Cervical back: Normal range of motion and neck supple. No tenderness.     Right lower leg: No edema.     Left lower leg: No edema.  Lymphadenopathy:     Cervical: No cervical adenopathy.  Neurological:     General: No focal deficit present.     Mental Status: She is alert and oriented  to person, place, and time.  Psychiatric:        Mood and Affect: Mood normal.      The 10-year ASCVD risk score (Arnett DK, et al., 2019) is: 5.8%    Assessment & Plan:  Marland KitchenMarland KitchenCarolann was seen today for medical management of chronic issues.  Diagnoses and all orders for this visit:  Anxiety and depression -     DULoxetine (CYMBALTA) 30 MG capsule; Take 1 capsule (30 mg total) by mouth daily.  Mixed hyperlipidemia -     Lipid panel -     CBC w/Diff/Platelet  Primary hypertension -     CMP14+EGFR -     CBC w/Diff/Platelet -     hydrochlorothiazide (HYDRODIURIL) 12.5 MG tablet; Take 1 tablet (12.5 mg total) by mouth daily.  Osteopenia of lumbar spine -     CBC w/Diff/Platelet -     DG Bone Density; Future  Hypertriglyceridemia -     Lipid panel -     CBC w/Diff/Platelet  Hashimoto's thyroiditis -     TSH + free T4 -     CBC w/Diff/Platelet  History of breast cancer  History of obesity  Immunization due -     Pfizer Comirnaty Covid -19 Vaccine 89yrs and older   Pt is doing great on wegovy she has lost 55lbs with no side effects Would like for her to consider adding more strength training to help with muscle growth and bone density Needs bone density Fasting labs ordered today Refilled cymbalta today  Return in about 6 months (around 08/16/2023).    Tandy Gaw, PA-C

## 2023-02-16 NOTE — Patient Instructions (Signed)
Will order bone density.

## 2023-02-17 LAB — CBC WITH DIFFERENTIAL/PLATELET
Basophils Absolute: 0 10*3/uL (ref 0.0–0.2)
Basos: 1 %
EOS (ABSOLUTE): 0 10*3/uL (ref 0.0–0.4)
Eos: 1 %
Hematocrit: 45 % (ref 34.0–46.6)
Hemoglobin: 14.7 g/dL (ref 11.1–15.9)
Immature Grans (Abs): 0 10*3/uL (ref 0.0–0.1)
Immature Granulocytes: 0 %
Lymphocytes Absolute: 1.2 10*3/uL (ref 0.7–3.1)
Lymphs: 20 %
MCH: 30.2 pg (ref 26.6–33.0)
MCHC: 32.7 g/dL (ref 31.5–35.7)
MCV: 92 fL (ref 79–97)
Monocytes Absolute: 0.3 10*3/uL (ref 0.1–0.9)
Monocytes: 5 %
Neutrophils Absolute: 4.2 10*3/uL (ref 1.4–7.0)
Neutrophils: 73 %
Platelets: 277 10*3/uL (ref 150–450)
RBC: 4.87 x10E6/uL (ref 3.77–5.28)
RDW: 12.3 % (ref 11.7–15.4)
WBC: 5.7 10*3/uL (ref 3.4–10.8)

## 2023-02-17 LAB — CMP14+EGFR
ALT: 45 [IU]/L — ABNORMAL HIGH (ref 0–32)
AST: 30 [IU]/L (ref 0–40)
Albumin: 4.6 g/dL (ref 3.9–4.9)
Alkaline Phosphatase: 63 [IU]/L (ref 44–121)
BUN/Creatinine Ratio: 11 — ABNORMAL LOW (ref 12–28)
BUN: 8 mg/dL (ref 8–27)
Bilirubin Total: 0.5 mg/dL (ref 0.0–1.2)
CO2: 26 mmol/L (ref 20–29)
Calcium: 9.7 mg/dL (ref 8.7–10.3)
Chloride: 100 mmol/L (ref 96–106)
Creatinine, Ser: 0.7 mg/dL (ref 0.57–1.00)
Globulin, Total: 2.2 g/dL (ref 1.5–4.5)
Glucose: 91 mg/dL (ref 70–99)
Potassium: 4.8 mmol/L (ref 3.5–5.2)
Sodium: 140 mmol/L (ref 134–144)
Total Protein: 6.8 g/dL (ref 6.0–8.5)
eGFR: 98 mL/min/{1.73_m2} (ref >59)

## 2023-02-17 LAB — LIPID PANEL
Chol/HDL Ratio: 2.1 {ratio} (ref 0.0–4.4)
Cholesterol, Total: 148 mg/dL (ref 100–199)
HDL: 70 mg/dL (ref >39)
LDL Chol Calc (NIH): 64 mg/dL (ref 0–99)
Triglycerides: 69 mg/dL (ref 0–149)
VLDL Cholesterol Cal: 14 mg/dL (ref 5–40)

## 2023-02-17 LAB — SPECIMEN STATUS REPORT

## 2023-02-17 LAB — TSH+FREE T4
Free T4: 1.49 ng/dL (ref 0.82–1.77)
TSH: 2.31 u[IU]/mL (ref 0.450–4.500)

## 2023-02-18 ENCOUNTER — Encounter: Payer: Self-pay | Admitting: Physician Assistant

## 2023-02-18 DIAGNOSIS — R748 Abnormal levels of other serum enzymes: Secondary | ICD-10-CM

## 2023-02-18 NOTE — Progress Notes (Signed)
Bonita,   Cholesterol looks great.  Thyroid looks great.  Kidney and glucose look wonderful.  ALT, a liver enzyme, up just a little. Are you taking tylenol regularly? Drinking alcohol? Avoid both for 2 weeks and recheck liver enzymes

## 2023-02-23 ENCOUNTER — Other Ambulatory Visit: Payer: BC Managed Care – PPO

## 2023-03-07 ENCOUNTER — Encounter: Payer: Self-pay | Admitting: Physician Assistant

## 2023-03-09 ENCOUNTER — Ambulatory Visit: Payer: BC Managed Care – PPO

## 2023-03-09 DIAGNOSIS — M85852 Other specified disorders of bone density and structure, left thigh: Secondary | ICD-10-CM | POA: Diagnosis not present

## 2023-03-09 DIAGNOSIS — Z780 Asymptomatic menopausal state: Secondary | ICD-10-CM | POA: Diagnosis not present

## 2023-03-09 DIAGNOSIS — R748 Abnormal levels of other serum enzymes: Secondary | ICD-10-CM | POA: Diagnosis not present

## 2023-03-09 DIAGNOSIS — M8588 Other specified disorders of bone density and structure, other site: Secondary | ICD-10-CM | POA: Diagnosis not present

## 2023-03-10 LAB — CMP14+EGFR
ALT: 74 IU/L — ABNORMAL HIGH (ref 0–32)
AST: 49 IU/L — ABNORMAL HIGH (ref 0–40)
Albumin: 4.5 g/dL (ref 3.9–4.9)
Alkaline Phosphatase: 66 IU/L (ref 44–121)
BUN/Creatinine Ratio: 13 (ref 12–28)
BUN: 9 mg/dL (ref 8–27)
Bilirubin Total: 0.7 mg/dL (ref 0.0–1.2)
CO2: 24 mmol/L (ref 20–29)
Calcium: 9.9 mg/dL (ref 8.7–10.3)
Chloride: 99 mmol/L (ref 96–106)
Creatinine, Ser: 0.69 mg/dL (ref 0.57–1.00)
Globulin, Total: 2.4 g/dL (ref 1.5–4.5)
Glucose: 81 mg/dL (ref 70–99)
Potassium: 3.8 mmol/L (ref 3.5–5.2)
Sodium: 139 mmol/L (ref 134–144)
Total Protein: 6.9 g/dL (ref 6.0–8.5)
eGFR: 99 mL/min/{1.73_m2} (ref >59)

## 2023-03-11 ENCOUNTER — Other Ambulatory Visit: Payer: Self-pay | Admitting: Physician Assistant

## 2023-03-11 DIAGNOSIS — E663 Overweight: Secondary | ICD-10-CM

## 2023-03-11 DIAGNOSIS — I10 Essential (primary) hypertension: Secondary | ICD-10-CM

## 2023-03-11 NOTE — Telephone Encounter (Signed)
 Last Fill: 02/04/23  Last OV: 02/16/23 Next OV: 07/13/23  Routing to provider for review/authorization.

## 2023-03-11 NOTE — Telephone Encounter (Signed)
 Copied from CRM [id number]. Topic: Clinical - Medication Refill >> Mar 11, 2023  2:32 PM Carlatta H wrote: Most Recent Primary Care Visit:  Provider: Jomarie Longs  Department: [facility]  Visit Type: OFFICE VISIT  Date: 02/16/2023  Medication: WEGOVY 2.4 MG/0.75ML Ivory Broad [[id number]]  [place]  Has the patient contacted their pharmacy? Yes (Agent: If no, request that the patient contact the pharmacy for the refill. If patient does not wish to contact the pharmacy document the reason why and proceed with request.) (Agent: If yes, when and what did the pharmacy advise?)  Is this the correct pharmacy for this prescription? Yes If no, delete pharmacy and type the correct one.  This is the patient's preferred pharmacy:  [id number] [place] - [address] [place] [address] Phone: [phone] Fax: [phone]   Has the prescription been filled recently? No  Is the patient out of the medication? Yes  Has the patient been seen for an appointment in the last year OR does the patient have an upcoming appointment? Yes  Can we respond through MyChart? Yes  Agent: Please be advised that Rx refills may take up to 3 business days. We ask that you follow-up with your pharmacy.

## 2023-03-13 ENCOUNTER — Encounter: Payer: Self-pay | Admitting: Physician Assistant

## 2023-03-14 ENCOUNTER — Encounter: Payer: Self-pay | Admitting: Physician Assistant

## 2023-03-14 ENCOUNTER — Other Ambulatory Visit: Payer: Self-pay | Admitting: Physician Assistant

## 2023-03-14 DIAGNOSIS — R748 Abnormal levels of other serum enzymes: Secondary | ICD-10-CM

## 2023-03-14 NOTE — Telephone Encounter (Signed)
 Ok to stop. Lets see what new liver enzymes look like in April and then discuss trying another statin.    Lesly Rubenstein

## 2023-03-14 NOTE — Progress Notes (Signed)
 Your liver enzymes are up some. Recheck again in 2-3 weeks. Avoid alcohol or tylenol products.

## 2023-03-14 NOTE — Progress Notes (Signed)
 Jenna [name],   Your bone density shows osteopenia( low bone mass) it has worsened only a little since last dexa from -1.4 to -1.9 which is good news. Make sure you take at least 1200units of vitamin and 500mg  twice a day of calcium daily. Daily exercise and strength training are also beneficial. Recheck in 2 years.

## 2023-03-14 NOTE — Telephone Encounter (Signed)
 Spoke with pharmacy. Should have been 3ml with one refill. Script was corrected and filled for patient.

## 2023-03-14 NOTE — Telephone Encounter (Signed)
 Ooops I responded to the earlier reply. I will stop taking them, do a new test and then see which way to go.

## 2023-03-17 ENCOUNTER — Other Ambulatory Visit: Payer: Self-pay

## 2023-03-17 DIAGNOSIS — E663 Overweight: Secondary | ICD-10-CM

## 2023-03-17 DIAGNOSIS — I10 Essential (primary) hypertension: Secondary | ICD-10-CM

## 2023-03-17 MED ORDER — WEGOVY 2.4 MG/0.75ML ~~LOC~~ SOAJ: 2.4000 mg | SUBCUTANEOUS | 5 refills | Status: DC

## 2023-04-07 LAB — CMP14+EGFR
ALT: 33 IU/L — ABNORMAL HIGH (ref 0–32)
AST: 31 IU/L (ref 0–40)
Albumin: 4.4 g/dL (ref 3.9–4.9)
Alkaline Phosphatase: 62 IU/L (ref 44–121)
BUN/Creatinine Ratio: 13 (ref 12–28)
BUN: 9 mg/dL (ref 8–27)
Bilirubin Total: 0.6 mg/dL (ref 0.0–1.2)
CO2: 27 mmol/L (ref 20–29)
Calcium: 9.6 mg/dL (ref 8.7–10.3)
Chloride: 98 mmol/L (ref 96–106)
Creatinine, Ser: 0.69 mg/dL (ref 0.57–1.00)
Globulin, Total: 2.3 g/dL (ref 1.5–4.5)
Glucose: 87 mg/dL (ref 70–99)
Potassium: 4.1 mmol/L (ref 3.5–5.2)
Sodium: 138 mmol/L (ref 134–144)
Total Protein: 6.7 g/dL (ref 6.0–8.5)
eGFR: 99 mL/min/{1.73_m2} (ref >59)

## 2023-04-08 ENCOUNTER — Encounter: Payer: Self-pay | Admitting: Physician Assistant

## 2023-04-08 NOTE — Progress Notes (Signed)
 Liver enzymes much better! Almost back to goal!

## 2023-05-16 ENCOUNTER — Inpatient Hospital Stay: Payer: BC Managed Care – PPO | Attending: Hematology and Oncology | Admitting: Hematology and Oncology

## 2023-05-16 VITALS — BP 119/78 | HR 72 | Temp 98.4°F | Resp 17 | Ht 62.0 in | Wt 129.2 lb

## 2023-05-16 DIAGNOSIS — Z170 Estrogen receptor positive status [ER+]: Secondary | ICD-10-CM | POA: Insufficient documentation

## 2023-05-16 DIAGNOSIS — C50512 Malignant neoplasm of lower-outer quadrant of left female breast: Secondary | ICD-10-CM | POA: Insufficient documentation

## 2023-05-16 DIAGNOSIS — M8580 Other specified disorders of bone density and structure, unspecified site: Secondary | ICD-10-CM | POA: Insufficient documentation

## 2023-05-16 DIAGNOSIS — Z79811 Long term (current) use of aromatase inhibitors: Secondary | ICD-10-CM | POA: Diagnosis not present

## 2023-05-16 NOTE — Progress Notes (Signed)
 Patient Care Team: Breeback, Jade L, PA-C as PCP - General (Family Medicine) Sim Dryer, MD as Consulting Physician (General Surgery) Cameron Cea, MD as Consulting Physician (Hematology and Oncology) Retta Caster, MD as Consulting Physician (Radiation Oncology) Debbie Fails Laura Polio, NP as Nurse Practitioner (Hematology and Oncology)  DIAGNOSIS:  Encounter Diagnosis  Name Primary?   Malignant neoplasm of lower-outer quadrant of left breast of female, estrogen receptor positive (HCC) Yes    SUMMARY OF ONCOLOGIC HISTORY: Oncology History  Breast cancer of lower-outer quadrant of left female breast (HCC)  09/10/2015 Initial Diagnosis   Screening detected left breast asymmetry: 2 adjacent masses 4:00: 8 mm and 3:30: 9 mm; axilla negative, biopsy grade 2-3 IDC, ER 70%, PR 5%, Ki-67 10%, HER-2 positive ratio 3.41, T1BN0 stage IA   09/25/2015 Surgery   Left lumpectomy: IDC with DCIS, 1.2 cm, margins negative, 0/4 lymph nodes negative, grade 3, ER 70%, PR 5%, HER-2 positive ratio 3.45, Ki-67 10%, T1 CN 0 stage IA   10/31/2015 - 02/13/2016 Chemotherapy   Adjuvant chemotherapy with TCH 6 cycles followed by Herceptin  maintenance for 1 year   03/10/2016 - 04/23/2016 Radiation Therapy   Adjuvant radiation therapy   05/28/2016 -  Anti-estrogen oral therapy   Anastrozole  1 mg daily, changed to letrozole  after 2 months due to poor tolerance, switched to anastrozole  due to right forearm pain from tendinitis     CHIEF COMPLIANT:   HISTORY OF PRESENT ILLNESS: Discussed the use of AI scribe software for clinical note transcription with the patient, who gave verbal consent to proceed.  History of Present Illness Jenna [name] is a 63 year old female who presents for a follow-up regarding her weight management and medication adjustments.  She has been on Wegovy  for seven years, achieving a 50-pound weight loss and is now maintaining her weight. She is considering a dosage reduction and  experiences no significant side effects. Challenges include traveling with Wegovy  due to refrigeration needs and TSA checks, but she maintains her regimen effectively.  Her blood pressure has improved, allowing discontinuation of Lipitor. She is on the lowest dosage of hydrochlorothiazide  and plans to monitor her blood pressure daily.     ALLERGIES:  is allergic to latex and sulfa antibiotics.  MEDICATIONS:  Current Outpatient Medications  Medication Sig Dispense Refill   anastrozole  (ARIMIDEX ) 1 MG tablet Take 1 tablet (1 mg total) by mouth daily. 90 tablet 3   atorvastatin  (LIPITOR) 40 MG tablet TAKE 1 TABLET BY MOUTH EVERY DAY 90 tablet 3   DULoxetine  (CYMBALTA ) 30 MG capsule Take 1 capsule (30 mg total) by mouth daily. 90 capsule 1   hydrochlorothiazide  (HYDRODIURIL ) 12.5 MG tablet Take 1 tablet (12.5 mg total) by mouth daily. 90 tablet 1   levothyroxine  (SYNTHROID ) 75 MCG tablet Take 1 tablet (75 mcg total) by mouth daily. 90 tablet 3   WEGOVY  2.4 MG/0.75ML SOAJ Inject 2.4 mg into the skin once a week. 3 mL 5   No current facility-administered medications for this visit.    PHYSICAL EXAMINATION: ECOG PERFORMANCE STATUS: 1 - Symptomatic but completely ambulatory  Vitals:   05/16/23 0939  BP: 119/78  Pulse: 72  Resp: 17  Temp: 98.4 F (36.9 C)  SpO2: 100%   Filed Weights   05/16/23 0939  Weight: 129 lb 3.2 oz (58.6 kg)    Physical Exam   (exam performed in the presence of a chaperone)  LABORATORY DATA:  I have reviewed the data as listed    Latest  Ref Rng & Units 04/06/2023    9:11 AM 03/09/2023    9:21 AM 02/16/2023   12:00 AM  CMP  Glucose 70 - 99 mg/dL 87  81  91   BUN 8 - 27 mg/dL 9  9  8    Creatinine 0.57 - 1.00 mg/dL 2.95  6.21  3.08   Sodium 134 - 144 mmol/L 138  139  140   Potassium 3.5 - 5.2 mmol/L 4.1  3.8  4.8   Chloride 96 - 106 mmol/L 98  99  100   CO2 20 - 29 mmol/L 27  24  26    Calcium  8.7 - 10.3 mg/dL 9.6  9.9  9.7   Total Protein 6.0 - 8.5 g/dL  6.7  6.9  6.8   Total Bilirubin 0.0 - 1.2 mg/dL 0.6  0.7  0.5   Alkaline Phos 44 - 121 IU/L 62  66  63   AST 0 - 40 IU/L 31  49  30   ALT 0 - 32 IU/L 33  74  45     Lab Results  Component Value Date   WBC 5.7 02/16/2023   HGB 14.7 02/16/2023   HCT 45.0 02/16/2023   MCV 92 02/16/2023   PLT 277 02/16/2023   NEUTROABS 4.2 02/16/2023    ASSESSMENT & PLAN:  Breast cancer of lower-outer quadrant of left female breast (HCC) Left lumpectomy 09/25/2015: IDC with DCIS, 1.2 cm, margins negative, 0/4 lymph nodes negative, grade 3, ER 70%, PR 5%, HER-2 positive ratio 3.45, Ki-67 10%, T1 CN 0 stage IA   Treatment plan: 1. Adjuvant chemotherapy with TCH 6 cycles 10/31/2015- 02/13/2016 followed by Herceptin  every 3 weeks for 1 year completed 07/30/2016 2. Followed by adjuvant radiation 03/10/2016-  04/23/2016  3. Followed by adjuvant antiestrogen therapy started 05/28/2016-05/16/2023 ---------------------------------------------------------------------------------------------------------------------------------- Anastrozole  toxicities : Does not have any myalgias She completed 7 years of antiestrogen therapy and therefore we discussed and discontinued her antiestrogen therapy.   Survivorship: She lost 50 pounds weight on Wegovy .  She is feeling a lot healthier.  She spends a lot of time tending to her vegetable and fruit garden.   CT CAP 02/07/2017: No evidence of metastatic disease, 4 mm subpleural left lower lobe nodule nonspecific Bone scan 02/07/2017: No findings of metastatic disease   Breast cancer surveillance: 11/30/2021: Mammogram: Benign.  05/16/2023: Breast exam: Benign She stays extremely busy with gardening and tending to her fruit orchard as well as her vegetable garden.   Return to clinic on an as-needed basis Assessment & Plan Breast cancer Malignant neoplasm of the lower-outer quadrant of the left breast, estrogen receptor positive. In remission after seven years of anastrozole .  Recent mammograms clear, breast density B. - Discontinue anastrozole . - Continue regular mammograms, typically scheduled in January.  Obesity Lost 50 pounds, maintaining weight loss. Wegovy  effective in weight loss and blood pressure improvement. Benefits of Wegovy  outweigh travel inconvenience. Risk of weight gain if stopped. - Continue Wegovy  as long as effective and well-tolerated. - Discuss with primary care physician the desire to remain on Wegovy .  Hypertension Well-controlled, likely due to weight loss and Wegovy . Excellent blood pressure readings on lowest hydrochlorothiazide  dosage. Considering discontinuation. - Discuss with primary care physician about potentially discontinuing hydrochlorothiazide . - Monitor blood pressure daily and report readings to primary care physician in a couple of weeks.  Osteopenia Osteopenia present but not severe enough for medication. - Continue calcium  and vitamin D  supplementation. - Engage in regular walking exercises to improve bone density.  No orders of the defined types were placed in this encounter.  The patient has a good understanding of the overall plan. she agrees with it. she will call with any problems that may develop before the next visit here. Total time spent: 30 mins including face to face time and time spent for planning, charting and co-ordination of care   Viinay K Sagrario Lineberry, MD 05/16/23

## 2023-05-16 NOTE — Assessment & Plan Note (Signed)
 Left lumpectomy 09/25/2015: IDC with DCIS, 1.2 cm, margins negative, 0/4 lymph nodes negative, grade 3, ER 70%, PR 5%, HER-2 positive ratio 3.45, Ki-67 10%, T1 CN 0 stage IA   Treatment plan: 1. Adjuvant chemotherapy with TCH 6 cycles 10/31/2015- 02/13/2016 followed by Herceptin  every 3 weeks for 1 year completed 07/30/2016 2. Followed by adjuvant radiation 03/10/2016-  04/23/2016  3. Followed by adjuvant antiestrogen therapy started 05/28/2016 ---------------------------------------------------------------------------------------------------------------------------------- Anastrozole  toxicities : Does not have any myalgias Plan to treat her for 7 years.   Survivorship: She lost 45 pounds weight on Wegovy .  She is feeling a lot healthier.  She spends a lot of time tending to her vegetable and fruit garden.   CT CAP 02/07/2017: No evidence of metastatic disease, 4 mm subpleural left lower lobe nodule nonspecific Bone scan 02/07/2017: No findings of metastatic disease   Breast cancer surveillance: 11/30/2021: Mammogram: Benign.  05/16/2023: Breast exam: Benign She stays extremely busy with gardening and tending to her fruit orchard as well as her vegetable garden.   Return to clinic in 1 year for follow-up

## 2023-06-08 ENCOUNTER — Ambulatory Visit: Payer: BC Managed Care – PPO | Admitting: Internal Medicine

## 2023-06-08 ENCOUNTER — Encounter: Payer: Self-pay | Admitting: Internal Medicine

## 2023-06-08 VITALS — BP 120/60 | HR 68 | Ht 62.0 in | Wt 129.4 lb

## 2023-06-08 DIAGNOSIS — E063 Autoimmune thyroiditis: Secondary | ICD-10-CM

## 2023-06-08 DIAGNOSIS — M9902 Segmental and somatic dysfunction of thoracic region: Secondary | ICD-10-CM | POA: Diagnosis not present

## 2023-06-08 DIAGNOSIS — M9901 Segmental and somatic dysfunction of cervical region: Secondary | ICD-10-CM | POA: Diagnosis not present

## 2023-06-08 DIAGNOSIS — M9903 Segmental and somatic dysfunction of lumbar region: Secondary | ICD-10-CM | POA: Diagnosis not present

## 2023-06-08 DIAGNOSIS — M542 Cervicalgia: Secondary | ICD-10-CM | POA: Diagnosis not present

## 2023-06-08 NOTE — Progress Notes (Unsigned)
 Patient ID: Jenna [name], female   DOB: 1960-06-30, 63 y.o.   MRN: [id number]   HPI  Jenna [name] is a 63 y.o.-year-old female, initially referred by Dr. Honora Lutes, returning for follow-up forHashimoto's hypothyroidism.  Last visit 1 year ago.  Interim history: She was previously seen in the weight management clinic, but then stopped.  She was on Ozempic and lost ~40 lbs in ~1.5 years.  Her cravings for salty foods and alcohol disappeared and she was able to be taken off her blood pressure medicines she was taken off her blood pressure medicines.  Her goal weight is 120 pounds. She is on Wegovy  2.4 mg weekly. Off cholesterol meds also. She is now officially breast cancer free, and off anastrozole .  Reviewed history: She was diagnosed with Hashimoto's hypothyroidism in 2015-2014 after she described lethargy, inability to lose weight, hair loss.  She was started on Levothyroxine  (LT4) initially, but quickly changed to liothyronine  (LT3) 50 mcg daily >> then changed to 25 mcg daily in 04/2019 (equivalent of 100 mcg levothyroxine  daily).  On this regimen, her TSH was suppressed.  In 05/2019, we changed to: - LT4 50 mcg daily - LT3 12.5 mcg daily For an equivalent of LT4 of 100 mcg daily  In 06/2019, we changed to: - LT4 88 mcg daily - LT3 5 mcg daily For an equivalent of LT4 of 108 mcg daily  In 11/2019, we changed to:  - LT4 100 mcg daily  - LT3 2.5 mcg daily  For an equivalent of 110 mcg daily LT4  In 05/2019, we changed to: - LT4 112 mcg daily  In 08/2020, we decreased the dose to: - LT4 100 mcg daily  In 05/2021, we decreased the dose to: - LT4 88 mcg daily  In 07/2021, we decreased the dose to: - LT4 75 mcg daily  She takes LT4: - previously at night (2-3 hours after dinner) >> now in a.m. - with a full glass of water - b'fast after 2-3 hours  - black coffee - + Calcium  - at night - + MVI - at night - prev.  On omeprazole  twice a day >> came off - prev.  On  biotin >> came off  - on Fish Oil at night  I reviewed her TFTs: Lab Results  Component Value Date   TSH 2.310 02/16/2023   TSH 1.83 05/27/2022   TSH 1.25 12/03/2021   TSH 2.24 08/14/2021   TSH 0.27 (L) 07/15/2021   TSH 0.43 06/02/2021   TSH 2.22 11/18/2020   TSH 0.39 08/29/2020   TSH 0.79 05/13/2020   TSH 3.22 11/12/2019   FREET4 1.49 02/16/2023   FREET4 1.14 05/27/2022   FREET4 1.17 12/03/2021   FREET4 1.02 08/14/2021   FREET4 1.35 07/15/2021   FREET4 1.46 06/02/2021   FREET4 1.12 11/18/2020   FREET4 1.10 08/29/2020   FREET4 1.09 05/13/2020   FREET4 0.77 11/12/2019    Her thyroid  antibodies were elevated: Component     Latest Ref Rng & Units 11/12/2019  Thyroperoxidase Ab SerPl-aCnc     <9 IU/mL 335 (H)  Thyroglobulin Ab     < or = 1 IU/mL 16 (H)   Component     Latest Ref Rng & Units 05/14/2019  Thyroperoxidase Ab SerPl-aCnc     <9 IU/mL 379 (H)  Thyroglobulin Ab     < or = 1 IU/mL 13 (H)  Previously: 08/12/2015: ATA 14 (<2), TPO 162 ( (<9)  In 05/2019, I advised her to  start selenium 200 mcg daily.  Her antithyroid antibodies did not improve significantly while on this in the past >> stopped.  Pt denies: - feeling nodules in neck - hoarseness - dysphagia - choking  No FH of thyroid  cancer. + FH of autoimmune ds.: sister and brother. Father with SLE. P cousins with Hashimoto's thyroiditis. No h/o radiation tx to head or neck. No herbal supplements. No Biotin use. No recent steroids use.   Pt. also has a history of breast cancer.  She is off anastrozole  now. She walks for exercise. Swimming during the summer. She continues to take Cymbalta  for chronic anxiety.  ROS: + See HPI  Past Medical History:  Diagnosis Date   Anxiety    Asthma    Breast cancer (HCC) 2017   Left Breast Cancer   Breast cancer of lower-outer quadrant of left female breast (HCC) 09/12/2015   Complication of anesthesia    hard time waking up with gallbladder   Depression     Gallbladder problem    GERD (gastroesophageal reflux disease)    Hashimoto's disease    Heartburn    History of external beam radiation therapy 03/10/16-04/23/16   left breast/50.4 Gy in 28 fractions, left breast boost/10 Gy in 5 fractions   Hypothyroidism    Irritable bowel    Joint pain    Personal history of chemotherapy 2017-2018   Left Breast Cancer   Personal history of radiation therapy 2018   Left Breast Cancer   Uterine displacement    Past Surgical History:  Procedure Laterality Date   BACK SURGERY     BREAST BIOPSY Left 09/10/2015   BREAST LUMPECTOMY Left 09/25/2015   BREAST LUMPECTOMY WITH NEEDLE LOCALIZATION AND AXILLARY SENTINEL LYMPH NODE BX Left 09/25/2015   Procedure: BREAST LUMPECTOMY WITH NEEDLE LOCALIZATION X'S 2 AND AXILLARY SENTINEL LYMPH NODE BX;  Surgeon: Sim Dryer, MD;  Location: [facility];  Service: General;  Laterality: Left;   CHOLECYSTECTOMY OPEN     COLONOSCOPY WITH PROPOFOL  N/A 04/13/2019   Procedure: COLONOSCOPY WITH PROPOFOL ;  Surgeon: Luke Salaam, MD;  Location: [facility];  Service: Gastroenterology;  Laterality: N/A;   disk fusion     ENDOMETRIAL ABLATION     MASTOPEXY Bilateral 10/25/2016   Procedure: BILATERAL MASTOPEXY;  Surgeon: Alger Infield, MD;  Location: [facility];  Service: Plastics;  Laterality: Bilateral;   neck fusion     PORT-A-CATH REMOVAL Right 10/25/2016   Procedure: RIGHT CHEST PORT REMOVAL;  Surgeon: Alger Infield, MD;  Location: [facility];  Service: Plastics;  Laterality: Right;   PORTACATH PLACEMENT Right 09/25/2015   Procedure: INSERTION PORT-A-CATH;  Surgeon: Sim Dryer, MD;  Location: [facility];  Service: General;  Laterality: Right;   REDUCTION MAMMAPLASTY Bilateral 2018   Social History   Socioeconomic History   Marital status: Married    Spouse name: Not on file   Number of children: 2   Years of education: Not on file   Highest  education level: Bachelor's degree (e.g., BA, AB, BS)  Occupational History   Occupation: n/a  Tobacco Use   Smoking status: Former    Current packs/day: 0.00    Types: Cigarettes    Start date: 106    Quit date: 1986    Years since quitting: 39.4   Smokeless tobacco: Never  Vaping Use   Vaping status: Never Used  Substance and Sexual Activity   Alcohol use: Yes    Alcohol/week: 2.0 - 3.0  standard drinks of alcohol    Types: 2 - 3 Standard drinks or equivalent per week    Comment: social   Drug use: No   Sexual activity: Yes    Partners: Male  Other Topics Concern   Not on file  Social History Narrative   Not on file   Social Drivers of Health   Financial Resource Strain: Low Risk  (02/09/2023)   Overall Financial Resource Strain (CARDIA)    Difficulty of Paying Living Expenses: Not very hard  Food Insecurity: No Food Insecurity (02/09/2023)   Hunger Vital Sign    Worried About Running Out of Food in the Last Year: Never true    Ran Out of Food in the Last Year: Never true  Transportation Needs: No Transportation Needs (02/09/2023)   PRAPARE - Administrator, Civil Service (Medical): No    Lack of Transportation (Non-Medical): No  Physical Activity: Sufficiently Active (02/09/2023)   Exercise Vital Sign    Days of Exercise per Week: 5 days    Minutes of Exercise per Session: 30 min  Stress: Stress Concern Present (02/09/2023)   [employer] of Occupational Health - Occupational Stress Questionnaire    Feeling of Stress : To some extent  Social Connections: Unknown (02/09/2023)   Social Connection and Isolation Panel [NHANES]    Frequency of Communication with Friends and Family: Once a week    Frequency of Social Gatherings with Friends and Family: Patient declined    Attends Religious Services: Patient declined    Database administrator or Organizations: No    Attends Engineer, structural: Not on file    Marital Status: Married  Catering manager  Violence: Not on file   Current Outpatient Medications on File Prior to Visit  Medication Sig Dispense Refill   DULoxetine  (CYMBALTA ) 30 MG capsule Take 1 capsule (30 mg total) by mouth daily. 90 capsule 1   hydrochlorothiazide  (HYDRODIURIL ) 12.5 MG tablet Take 1 tablet (12.5 mg total) by mouth daily. 90 tablet 1   levothyroxine  (SYNTHROID ) 75 MCG tablet Take 1 tablet (75 mcg total) by mouth daily. 90 tablet 3   WEGOVY  2.4 MG/0.75ML SOAJ Inject 2.4 mg into the skin once a week. 3 mL 5   No current facility-administered medications on file prior to visit.   Allergies  Allergen Reactions   Latex Rash   Sulfa Antibiotics Rash   Family History  Problem Relation Age of Onset   Diabetes Mother    Hypertension Mother    Hyperlipidemia Mother    Kidney disease Mother    Obesity Mother    Obesity Father    Cancer Maternal Grandfather    Breast cancer Cousin 42   Breast cancer Cousin    Breast cancer Cousin    Thyroid  disease Other   Also, please see HPI. HTN in oldest brother.  PE: BP 120/60   Pulse 68   Ht 5\' 2"  (1.575 m)   Wt 129 lb 6.4 oz (58.7 kg)   SpO2 97%   BMI 23.67 kg/m  Wt Readings from Last 3 Encounters:  06/08/23 129 lb 6.4 oz (58.7 kg)  05/16/23 129 lb 3.2 oz (58.6 kg)  02/16/23 129 lb (58.5 kg)   Constitutional: normal weight, in NAD Eyes:  EOMI, no exophthalmos ENT: no neck masses, no cervical lymphadenopathy Cardiovascular: RRR, No MRG Respiratory: CTA B Musculoskeletal: no deformities Skin:no rashes Neurological: no tremor with outstretched hands  ASSESSMENT: 1.  Hypothyroidism due to Hashimoto's thyroiditis  PLAN: 1.  Patient with history of Hashimoto's thyroiditis with subsequent hypothyroidism, on replacement with liothyronine  only at our first visit, which we then changed to her levothyroxine  only regimen.  She is doing well on this.  She is also on selenium 200 mcg daily. - latest thyroid  labs reviewed with pt. >> normal: Lab Results   Component Value Date   TSH 2.310 02/16/2023  - she continues on LT4 75 mcg daily - pt feels good on this dose. - we discussed about taking the thyroid  hormone every day, with water, >30 minutes before breakfast, separated by >4 hours from acid reflux medications, calcium , iron, multivitamins. Pt. is taking it correctly. - will check thyroid  tests today: TSH and fT4 - If labs are abnormal, she will need to return for repeat TFTs in 1.5 months - OTW, I will see her back in a year  Orders Placed This Encounter  Procedures   TSH   T4, free   Needs refills.  Emilie Harden, MD PhD [facility] Endocr[facility]ology

## 2023-06-08 NOTE — Patient Instructions (Addendum)
Please continue Levothyroxine 75 mcg daily.  Take the thyroid hormone every day, with water, at least 30 minutes before breakfast, separated by at least 4 hours from: - acid reflux medications - calcium - iron - multivitamins  Please stop at the lab.  Please come back for a follow-up appointment in 1 year. 

## 2023-06-09 ENCOUNTER — Ambulatory Visit: Payer: Self-pay | Admitting: Internal Medicine

## 2023-06-09 LAB — TSH: TSH: 1.75 m[IU]/L (ref 0.40–4.50)

## 2023-06-09 LAB — T4, FREE: Free T4: 1.6 ng/dL (ref 0.8–1.8)

## 2023-06-09 MED ORDER — LEVOTHYROXINE SODIUM 75 MCG PO TABS
75.0000 ug | ORAL_TABLET | Freq: Every day | ORAL | 3 refills | Status: AC
Start: 2023-06-09 — End: ?

## 2023-07-07 DIAGNOSIS — M542 Cervicalgia: Secondary | ICD-10-CM | POA: Diagnosis not present

## 2023-07-07 DIAGNOSIS — M9902 Segmental and somatic dysfunction of thoracic region: Secondary | ICD-10-CM | POA: Diagnosis not present

## 2023-07-07 DIAGNOSIS — M9901 Segmental and somatic dysfunction of cervical region: Secondary | ICD-10-CM | POA: Diagnosis not present

## 2023-07-07 DIAGNOSIS — M9903 Segmental and somatic dysfunction of lumbar region: Secondary | ICD-10-CM | POA: Diagnosis not present

## 2023-07-13 ENCOUNTER — Ambulatory Visit: Payer: BC Managed Care – PPO | Admitting: Physician Assistant

## 2023-07-13 ENCOUNTER — Encounter: Payer: Self-pay | Admitting: Physician Assistant

## 2023-07-13 VITALS — BP 126/70 | HR 67 | Ht 62.0 in | Wt 130.0 lb

## 2023-07-13 DIAGNOSIS — Z853 Personal history of malignant neoplasm of breast: Secondary | ICD-10-CM | POA: Diagnosis not present

## 2023-07-13 DIAGNOSIS — F32A Depression, unspecified: Secondary | ICD-10-CM

## 2023-07-13 DIAGNOSIS — I10 Essential (primary) hypertension: Secondary | ICD-10-CM

## 2023-07-13 DIAGNOSIS — Z8639 Personal history of other endocrine, nutritional and metabolic disease: Secondary | ICD-10-CM | POA: Diagnosis not present

## 2023-07-13 DIAGNOSIS — F419 Anxiety disorder, unspecified: Secondary | ICD-10-CM

## 2023-07-13 DIAGNOSIS — R748 Abnormal levels of other serum enzymes: Secondary | ICD-10-CM | POA: Insufficient documentation

## 2023-07-13 MED ORDER — WEGOVY 2.4 MG/0.75ML ~~LOC~~ SOAJ: 2.4000 mg | SUBCUTANEOUS | 5 refills | Status: DC

## 2023-07-13 MED ORDER — DULOXETINE HCL 30 MG PO CPEP: 30.0000 mg | ORAL_CAPSULE | Freq: Every day | ORAL | 1 refills | Status: DC

## 2023-07-13 MED ORDER — HYDROCHLOROTHIAZIDE 12.5 MG PO TABS: 12.5000 mg | ORAL_TABLET | Freq: Every day | ORAL | 1 refills | Status: DC

## 2023-07-13 NOTE — Progress Notes (Signed)
 Established Patient Office Visit  Subjective   Patient ID: Jenna [name], female    DOB: 09/14/1960  Age: 63 y.o. MRN: [id number]  Chief Complaint  Patient presents with   Medical Management of Chronic Issues    HPI Pt is a 62` yo female who presents to the clinic for medication refills.   She has done fabulous with weight loss on wegovy . She is down 55lbs. She has been stable over the last 6 months. She is very active and started more strength training as well. She is in remission from breast cancer. She has great energy and no side effects to medication. Mood is good. She denies any CP, palpitations, headaches, swelling.    ROS See HPI.    Objective:     BP 126/70   Pulse 67   Ht 5' 2 (1.575 m)   Wt 130 lb (59 kg)   SpO2 99%   BMI 23.78 kg/m  BP Readings from Last 3 Encounters:  07/13/23 126/70  06/08/23 120/60  05/16/23 119/78   Wt Readings from Last 3 Encounters:  07/13/23 130 lb (59 kg)  06/08/23 129 lb 6.4 oz (58.7 kg)  05/16/23 129 lb 3.2 oz (58.6 kg)    ..    07/13/2023    7:39 AM 02/16/2023    8:04 AM 08/16/2022    8:26 AM 12/16/2021    9:35 AM 12/16/2021    8:58 AM  Depression screen PHQ 2/9  Decreased Interest 1 0 0 1 1  Down, Depressed, Hopeless 1 0 0 1 1  PHQ - 2 Score 2 0 0 2 2  Altered sleeping 0 0  1 1  Tired, decreased energy 1 0  1 1  Change in appetite 0 0  0 0  Feeling bad or failure about yourself  1 0  0 0  Trouble concentrating 1 0  0 0  Moving slowly or fidgety/restless 0 0  0 0  Suicidal thoughts 0 0  0 0  PHQ-9 Score 5 0  4 4  Difficult doing work/chores Not difficult at all Not difficult at all  Not difficult at all Not difficult at all   ..    07/13/2023    7:39 AM 02/16/2023    8:04 AM 12/16/2021    9:35 AM 12/16/2021    8:59 AM  GAD 7 : Generalized Anxiety Score  Nervous, Anxious, on Edge 1 0 1 1  Control/stop worrying 1 0 1 1  Worry too much - different things 1 0 1 1  Trouble relaxing 0 0 0 0  Restless 0 0 0 0   Easily annoyed or irritable 0 0 1 1  Afraid - awful might happen 1 0 1 1  Total GAD 7 Score 4 0 5 5  Anxiety Difficulty Not difficult at all Not difficult at all Not difficult at all Not difficult at all      Physical Exam Constitutional:      Appearance: Normal appearance.  Cardiovascular:     Rate and Rhythm: Normal rate and regular rhythm.  Pulmonary:     Effort: Pulmonary effort is normal.     Breath sounds: Normal breath sounds.  Neurological:     Mental Status: She is alert and oriented to person, place, and time.  Psychiatric:        Mood and Affect: Mood normal.       The 10-year ASCVD risk score (Arnett DK, et al., 2019) is: 3.3%  Assessment & Plan:  SABRASABRACarolann was seen today for medical management of chronic issues.  Diagnoses and all orders for this visit:  Primary hypertension -     WEGOVY  2.4 MG/0.75ML SOAJ; Inject 2.4 mg into the skin once a week. -     hydrochlorothiazide  (HYDRODIURIL ) 12.5 MG tablet; Take 1 tablet (12.5 mg total) by mouth daily.  History of being obese -     WEGOVY  2.4 MG/0.75ML SOAJ; Inject 2.4 mg into the skin once a week.  History of breast cancer  Anxiety and depression -     DULoxetine  (CYMBALTA ) 30 MG capsule; Take 1 capsule (30 mg total) by mouth daily.   BP improved on 2nd recheck Continue hydrochlorothiazide  BMI is 23 and looks fabulous Pt not having any side effects to wegovy  Continue at 2.4mg  weekly dose Keep with exercise and healthy diet Goal is for weight stability and then will attempt to taper down on wegovy  dose PHQ/GAD to goal  Refilled cymbalta  Follow up in 6 months   Vermell Bologna, PA-C

## 2023-11-22 ENCOUNTER — Encounter: Payer: Self-pay | Admitting: Physician Assistant

## 2024-01-16 ENCOUNTER — Encounter: Payer: Self-pay | Admitting: Physician Assistant

## 2024-01-16 ENCOUNTER — Other Ambulatory Visit: Payer: Self-pay | Admitting: Physician Assistant

## 2024-01-16 DIAGNOSIS — Z1211 Encounter for screening for malignant neoplasm of colon: Secondary | ICD-10-CM

## 2024-01-16 DIAGNOSIS — Z1231 Encounter for screening mammogram for malignant neoplasm of breast: Secondary | ICD-10-CM

## 2024-01-31 ENCOUNTER — Ambulatory Visit

## 2024-02-01 ENCOUNTER — Ambulatory Visit: Admitting: Physician Assistant

## 2024-02-01 DIAGNOSIS — E663 Overweight: Secondary | ICD-10-CM

## 2024-02-01 DIAGNOSIS — F32A Depression, unspecified: Secondary | ICD-10-CM

## 2024-02-01 DIAGNOSIS — I10 Essential (primary) hypertension: Secondary | ICD-10-CM

## 2024-02-03 ENCOUNTER — Encounter: Payer: Self-pay | Admitting: Physician Assistant

## 2024-02-03 NOTE — Telephone Encounter (Signed)
 I spoke with patient and advised of how to connect.

## 2024-02-06 ENCOUNTER — Encounter: Payer: Self-pay | Admitting: Physician Assistant

## 2024-02-06 ENCOUNTER — Ambulatory Visit: Admitting: Physician Assistant

## 2024-02-06 VITALS — BP 125/84 | Wt 136.0 lb

## 2024-02-06 DIAGNOSIS — E063 Autoimmune thyroiditis: Secondary | ICD-10-CM

## 2024-02-06 DIAGNOSIS — Z8639 Personal history of other endocrine, nutritional and metabolic disease: Secondary | ICD-10-CM

## 2024-02-06 DIAGNOSIS — F419 Anxiety disorder, unspecified: Secondary | ICD-10-CM

## 2024-02-06 DIAGNOSIS — E782 Mixed hyperlipidemia: Secondary | ICD-10-CM

## 2024-02-06 DIAGNOSIS — Z853 Personal history of malignant neoplasm of breast: Secondary | ICD-10-CM

## 2024-02-06 DIAGNOSIS — I10 Essential (primary) hypertension: Secondary | ICD-10-CM | POA: Diagnosis not present

## 2024-02-06 DIAGNOSIS — F418 Other specified anxiety disorders: Secondary | ICD-10-CM

## 2024-02-06 MED ORDER — HYDROCHLOROTHIAZIDE 12.5 MG PO TABS: 12.5000 mg | ORAL_TABLET | Freq: Every day | ORAL | 1 refills | Status: AC

## 2024-02-06 MED ORDER — WEGOVY 1.7 MG/0.75ML ~~LOC~~ SOAJ: 1.7000 mg | SUBCUTANEOUS | 5 refills | Status: AC

## 2024-02-06 MED ORDER — DULOXETINE HCL 30 MG PO CPEP: 30.0000 mg | ORAL_CAPSULE | Freq: Every day | ORAL | 1 refills | Status: AC

## 2024-02-06 NOTE — Progress Notes (Signed)
 " ..Virtual Visit via Video Note  I connected with Jenna [name] on 02/06/24 at 10:50 AM EST by a video enabled telemedicine application and verified that I am speaking with the correct person using two identifiers.  Location: Patient: home Provider: home  .Participating in visit:  Patient: Brittnie Provider: Vermell Bologna PA-C Provider in training: Annitta Raw PA-S   I discussed the limitations of evaluation and management by telemedicine and the availability of in person appointments. The patient expressed understanding and agreed to proceed.  History of Present Illness: .Discussed the use of AI scribe software for clinical note transcription with the patient, who gave verbal consent to proceed.  History of Present Illness Jenna [name] is a 64 year old female who presents for a six-month follow-up and medication refills.  Hypertension - Takes hydrochlorothiazide  12.5 mg daily for blood pressure management - Compliant with medication regimen - No chest pain, palpitations, or headaches  Obesity and weight management - On Wegovy  2.4 mg weekly for weight management - Lost 55 pounds from starting weight of 177 pounds over two years - Weight loss maintained for the past year - Engages in regular physical activity including treadmill walking, swimming, and introductory strength and resistance training  Mood and anxiety symptoms - Mood is stable - PHQ-9 score of 5 and GAD-7 score of 4, both unchanged from six months ago - Takes Cymbalta  30 mg daily for mood management  Thyroid  disorder - Thyroid  condition managed by endocrinology  Preventive health maintenance - Up to date on mammogram - Awaiting scheduling for colonoscopy in the next few months      Observations/Objective: No acute distress  Normal mood and appearance  ..    02/06/2024   10:45 AM 07/13/2023    7:39 AM 02/16/2023    8:04 AM 08/16/2022    8:26 AM 12/16/2021    9:35 AM  Depression screen PHQ  2/9  Decreased Interest 0 1 0 0 1  Down, Depressed, Hopeless 1 1 0 0 1  PHQ - 2 Score 1 2 0 0 2  Altered sleeping 2 0 0  1  Tired, decreased energy 1 1 0  1  Change in appetite 0 0 0  0  Feeling bad or failure about yourself  1 1 0  0  Trouble concentrating 0 1 0  0  Moving slowly or fidgety/restless 0 0 0  0  Suicidal thoughts 0 0 0  0  PHQ-9 Score 5 5  0   4   Difficult doing work/chores Not difficult at all Not difficult at all Not difficult at all  Not difficult at all     Data saved with a previous flowsheet row definition   .    02/06/2024   10:48 AM 07/13/2023    7:39 AM 02/16/2023    8:04 AM 12/16/2021    9:35 AM  GAD 7 : Generalized Anxiety Score  Nervous, Anxious, on Edge 0 1  0  1   Control/stop worrying 1 1  0  1   Worry too much - different things 1 1  0  1   Trouble relaxing 1 0  0  0   Restless 0 0  0  0   Easily annoyed or irritable 0 0  0  1   Afraid - awful might happen 1 1  0  1   Total GAD 7 Score 4 4 0 5  Anxiety Difficulty Not difficult at all Not difficult at all  Not difficult at all Not difficult at all     Data saved with a previous flowsheet row definition     Today's Vitals   02/06/24 1043  BP: 125/84  Weight: 136 lb (61.7 kg)   Body mass index is 24.87 kg/m.    Assessment and Plan: .[name] was seen today for medical management of chronic issues.  Diagnoses and all orders for this visit:  History of being obese -     WEGOVY  1.7 MG/0.75ML SOAJ SQ injection; Inject 1.7 mg into the skin once a week.  Anxiety and depression -     DULoxetine  (CYMBALTA ) 30 MG capsule; Take 1 capsule (30 mg total) by mouth daily.  Primary hypertension -     hydrochlorothiazide  (HYDRODIURIL ) 12.5 MG tablet; Take 1 tablet (12.5 mg total) by mouth daily. -     WEGOVY  1.7 MG/0.75ML SOAJ SQ injection; Inject 1.7 mg into the skin once a week.  Hashimoto's thyroiditis  Mixed hyperlipidemia -     WEGOVY  1.7 MG/0.75ML SOAJ SQ injection; Inject 1.7 mg into the  skin once a week.  History of breast cancer -     WEGOVY  1.7 MG/0.75ML SOAJ SQ injection; Inject 1.7 mg into the skin once a week.   Assessment & Plan Primary hypertension Blood pressure controlled at 125/84 mmHg with hydrochlorothiazide . - Continue hydrochlorothiazide  12.5 mg oral daily.  Weight management in a patient with history of obesity and breast cancer Maintained 55-pound weight loss. On Wegovy  2.4 mg weekly. Oncologist supports weight maintenance to reduce breast cancer recurrence risk.  - Continue Wegovy  2.4 mg subcutaneous once a week. - Encouraged regular exercise regimen.  Anxiety and depression Mood stable with PHQ-9 score of 5 and GAD-7 score of 4. Compliant with Cymbalta . - Continue Cymbalta  30 mg oral daily.  General health maintenance Up to date on mammogram. Needs colonoscopy. - Will schedule colonoscopy with gastroenterologist.     Follow Up Instructions:    I discussed the assessment and treatment plan with the patient. The patient was provided an opportunity to ask questions and all were answered. The patient agreed with the plan and demonstrated an understanding of the instructions.   The patient was advised to call back or seek an in-person evaluation if the symptoms worsen or if the condition fails to improve as anticipated.  I provided 20 minutes of non-face-to-face time during this encounter.   [name], PA-C  "

## 2024-02-08 ENCOUNTER — Encounter: Payer: Self-pay | Admitting: Hematology and Oncology

## 2024-02-08 ENCOUNTER — Ambulatory Visit
Admission: RE | Admit: 2024-02-08 | Discharge: 2024-02-08 | Disposition: A | Source: Ambulatory Visit | Attending: Physician Assistant | Admitting: Physician Assistant

## 2024-02-08 DIAGNOSIS — Z1231 Encounter for screening mammogram for malignant neoplasm of breast: Secondary | ICD-10-CM

## 2024-02-09 ENCOUNTER — Encounter: Payer: Self-pay | Admitting: Physician Assistant

## 2024-02-10 ENCOUNTER — Ambulatory Visit: Payer: Self-pay | Admitting: Physician Assistant

## 2024-02-10 NOTE — Progress Notes (Signed)
 Normal mammogram. Follow up in 1 year.

## 2024-03-22 ENCOUNTER — Ambulatory Visit: Admit: 2024-03-22 | Admitting: Gastroenterology

## 2024-06-07 ENCOUNTER — Ambulatory Visit: Admitting: Internal Medicine
